# Patient Record
Sex: Female | Born: 1950 | Race: White | Hispanic: No | Marital: Married | Smoking: Current every day smoker
Health system: Southern US, Community
[De-identification: ages and names within clinical notes are randomized; demographics above are authoritative.]

## PROBLEM LIST (undated history)

## (undated) DIAGNOSIS — F17200 Nicotine dependence, unspecified, uncomplicated: Secondary | ICD-10-CM

## (undated) DIAGNOSIS — E785 Hyperlipidemia, unspecified: Secondary | ICD-10-CM

## (undated) DIAGNOSIS — I872 Venous insufficiency (chronic) (peripheral): Secondary | ICD-10-CM

## (undated) DIAGNOSIS — I639 Cerebral infarction, unspecified: Secondary | ICD-10-CM

## (undated) DIAGNOSIS — M8580 Other specified disorders of bone density and structure, unspecified site: Secondary | ICD-10-CM

## (undated) DIAGNOSIS — J449 Chronic obstructive pulmonary disease, unspecified: Secondary | ICD-10-CM

## (undated) DIAGNOSIS — I6522 Occlusion and stenosis of left carotid artery: Secondary | ICD-10-CM

## (undated) DIAGNOSIS — K219 Gastro-esophageal reflux disease without esophagitis: Secondary | ICD-10-CM

## (undated) DIAGNOSIS — I700 Atherosclerosis of aorta: Secondary | ICD-10-CM

## (undated) DIAGNOSIS — K279 Peptic ulcer, site unspecified, unspecified as acute or chronic, without hemorrhage or perforation: Secondary | ICD-10-CM

## (undated) DIAGNOSIS — K635 Polyp of colon: Secondary | ICD-10-CM

## (undated) DIAGNOSIS — R0600 Dyspnea, unspecified: Secondary | ICD-10-CM

## (undated) DIAGNOSIS — K5790 Diverticulosis of intestine, part unspecified, without perforation or abscess without bleeding: Secondary | ICD-10-CM

## (undated) DIAGNOSIS — M6208 Separation of muscle (nontraumatic), other site: Secondary | ICD-10-CM

## (undated) DIAGNOSIS — K2210 Ulcer of esophagus without bleeding: Secondary | ICD-10-CM

## (undated) DIAGNOSIS — K209 Esophagitis, unspecified without bleeding: Secondary | ICD-10-CM

## (undated) DIAGNOSIS — K2970 Gastritis, unspecified, without bleeding: Secondary | ICD-10-CM

## (undated) DIAGNOSIS — F419 Anxiety disorder, unspecified: Secondary | ICD-10-CM

## (undated) DIAGNOSIS — I209 Angina pectoris, unspecified: Secondary | ICD-10-CM

## (undated) DIAGNOSIS — K8020 Calculus of gallbladder without cholecystitis without obstruction: Secondary | ICD-10-CM

## (undated) DIAGNOSIS — I2510 Atherosclerotic heart disease of native coronary artery without angina pectoris: Secondary | ICD-10-CM

## (undated) DIAGNOSIS — F32A Depression, unspecified: Secondary | ICD-10-CM

## (undated) DIAGNOSIS — K529 Noninfective gastroenteritis and colitis, unspecified: Secondary | ICD-10-CM

## (undated) DIAGNOSIS — I10 Essential (primary) hypertension: Secondary | ICD-10-CM

## (undated) DIAGNOSIS — R519 Headache, unspecified: Secondary | ICD-10-CM

## (undated) DIAGNOSIS — K760 Fatty (change of) liver, not elsewhere classified: Secondary | ICD-10-CM

## (undated) DIAGNOSIS — M5126 Other intervertebral disc displacement, lumbar region: Secondary | ICD-10-CM

## (undated) DIAGNOSIS — M1990 Unspecified osteoarthritis, unspecified site: Secondary | ICD-10-CM

## (undated) DIAGNOSIS — Z87891 Personal history of nicotine dependence: Secondary | ICD-10-CM

## (undated) DIAGNOSIS — C4492 Squamous cell carcinoma of skin, unspecified: Secondary | ICD-10-CM

## (undated) DIAGNOSIS — E559 Vitamin D deficiency, unspecified: Secondary | ICD-10-CM

## (undated) DIAGNOSIS — I6529 Occlusion and stenosis of unspecified carotid artery: Secondary | ICD-10-CM

## (undated) DIAGNOSIS — K589 Irritable bowel syndrome without diarrhea: Secondary | ICD-10-CM

## (undated) DIAGNOSIS — F329 Major depressive disorder, single episode, unspecified: Secondary | ICD-10-CM

## (undated) DIAGNOSIS — C801 Malignant (primary) neoplasm, unspecified: Secondary | ICD-10-CM

## (undated) HISTORY — PX: BREAST BIOPSY: SHX20

## (undated) HISTORY — PX: DILATION AND CURETTAGE OF UTERUS: SHX78

## (undated) HISTORY — PX: VASCULAR SURGERY: SHX849

## (undated) HISTORY — PX: ABDOMINAL HYSTERECTOMY: SHX81

## (undated) HISTORY — PX: COLONOSCOPY W/ POLYPECTOMY: SHX1380

## (undated) HISTORY — PX: CHOLECYSTECTOMY: SHX55

## (undated) HISTORY — DX: Personal history of nicotine dependence: Z87.891

## (undated) HISTORY — DX: Malignant (primary) neoplasm, unspecified: C80.1

## (undated) HISTORY — PX: CAROTID ENDARTERECTOMY: SUR193

---

## 2006-08-16 ENCOUNTER — Emergency Department: Payer: Self-pay | Admitting: Emergency Medicine

## 2006-08-16 ENCOUNTER — Other Ambulatory Visit: Payer: Self-pay

## 2006-12-25 ENCOUNTER — Ambulatory Visit: Payer: Self-pay | Admitting: Family Medicine

## 2007-08-06 ENCOUNTER — Ambulatory Visit: Payer: Self-pay

## 2007-10-16 ENCOUNTER — Ambulatory Visit: Payer: Self-pay | Admitting: Family Medicine

## 2008-05-20 ENCOUNTER — Ambulatory Visit: Payer: Self-pay

## 2008-08-12 ENCOUNTER — Ambulatory Visit: Payer: Self-pay

## 2009-09-15 ENCOUNTER — Ambulatory Visit: Payer: Self-pay

## 2010-09-19 ENCOUNTER — Ambulatory Visit: Payer: Self-pay | Admitting: Family Medicine

## 2010-10-03 ENCOUNTER — Ambulatory Visit: Payer: Self-pay | Admitting: Family Medicine

## 2011-09-12 ENCOUNTER — Emergency Department: Payer: Self-pay | Admitting: Emergency Medicine

## 2011-09-12 LAB — COMPREHENSIVE METABOLIC PANEL
Albumin: 3.8 g/dL (ref 3.4–5.0)
Alkaline Phosphatase: 63 U/L (ref 50–136)
Anion Gap: 6 — ABNORMAL LOW (ref 7–16)
Bilirubin,Total: 0.5 mg/dL (ref 0.2–1.0)
Chloride: 110 mmol/L — ABNORMAL HIGH (ref 98–107)
Co2: 29 mmol/L (ref 21–32)
Creatinine: 0.83 mg/dL (ref 0.60–1.30)
EGFR (African American): >60
EGFR (Non-African Amer.): >60
Glucose: 136 mg/dL — ABNORMAL HIGH (ref 65–99)
Osmolality: 292 (ref 275–301)
Potassium: 4 mmol/L (ref 3.5–5.1)
SGPT (ALT): 18 U/L
Sodium: 145 mmol/L (ref 136–145)
Total Protein: 7.1 g/dL (ref 6.4–8.2)

## 2011-09-12 LAB — URINALYSIS, COMPLETE
Bilirubin,UR: NEGATIVE
Glucose,UR: NEGATIVE mg/dL (ref 0–75)
Leukocyte Esterase: NEGATIVE
Nitrite: NEGATIVE
Protein: NEGATIVE
RBC,UR: NONE SEEN /HPF (ref 0–5)
Specific Gravity: 1.012 (ref 1.003–1.030)
Squamous Epithelial: NONE SEEN
WBC UR: <1 /HPF (ref 0–5)

## 2011-09-12 LAB — CBC WITH DIFFERENTIAL/PLATELET
Basophil %: 0.7 %
Eosinophil #: 0.2 10*3/uL (ref 0.0–0.7)
HGB: 14.4 g/dL (ref 12.0–16.0)
Lymphocyte #: 2 10*3/uL (ref 1.0–3.6)
MCH: 34.3 pg — ABNORMAL HIGH (ref 26.0–34.0)
MCV: 97 fL (ref 80–100)
Monocyte #: 0.5 x10 3/mm (ref 0.2–0.9)
Monocyte %: 6.9 %
Neutrophil #: 4.6 10*3/uL (ref 1.4–6.5)
Neutrophil %: 62.6 %
Platelet: 181 10*3/uL (ref 150–440)
RBC: 4.19 10*6/uL (ref 3.80–5.20)
RDW: 14.9 % — ABNORMAL HIGH (ref 11.5–14.5)

## 2011-09-12 LAB — TSH: Thyroid Stimulating Horm: 1.44 u[IU]/mL

## 2011-11-23 ENCOUNTER — Ambulatory Visit: Payer: Self-pay | Admitting: Family Medicine

## 2012-01-23 LAB — COMPREHENSIVE METABOLIC PANEL
BUN: 16 mg/dL (ref 7–18)
Bilirubin,Total: 0.5 mg/dL (ref 0.2–1.0)
Calcium, Total: 9.4 mg/dL (ref 8.5–10.1)
Chloride: 107 mmol/L (ref 98–107)
Co2: 27 mmol/L (ref 21–32)
Creatinine: 0.88 mg/dL (ref 0.60–1.30)
EGFR (African American): >60
EGFR (Non-African Amer.): >60
Potassium: 4.5 mmol/L (ref 3.5–5.1)
SGOT(AST): 20 U/L (ref 15–37)
SGPT (ALT): 17 U/L (ref 12–78)

## 2012-01-23 LAB — URINALYSIS, COMPLETE
Bacteria: NONE SEEN
Bilirubin,UR: NEGATIVE
Glucose,UR: NEGATIVE mg/dL (ref 0–75)
Ketone: NEGATIVE
Nitrite: NEGATIVE
WBC UR: 38 /HPF (ref 0–5)

## 2012-01-23 LAB — CBC
MCHC: 36.1 g/dL — ABNORMAL HIGH (ref 32.0–36.0)
MCV: 96 fL (ref 80–100)
Platelet: 216 10*3/uL (ref 150–440)
RDW: 14.6 % — ABNORMAL HIGH (ref 11.5–14.5)

## 2012-01-23 LAB — DRUG SCREEN, URINE
Amphetamines, Ur Screen: NEGATIVE (ref ?–1000)
Barbiturates, Ur Screen: NEGATIVE (ref ?–200)
Benzodiazepine, Ur Scrn: NEGATIVE (ref ?–200)
MDMA (Ecstasy)Ur Screen: NEGATIVE (ref ?–500)
Methadone, Ur Screen: NEGATIVE (ref ?–300)
Tricyclic, Ur Screen: NEGATIVE (ref ?–1000)

## 2012-01-23 LAB — TROPONIN I: Troponin-I: <0.02 ng/mL

## 2012-01-23 LAB — ETHANOL: Ethanol: <3 mg/dL

## 2012-01-24 ENCOUNTER — Inpatient Hospital Stay: Payer: Self-pay | Admitting: Internal Medicine

## 2012-01-24 LAB — BASIC METABOLIC PANEL
Anion Gap: 7 (ref 7–16)
BUN: 14 mg/dL (ref 7–18)
Co2: 28 mmol/L (ref 21–32)
Creatinine: 0.77 mg/dL (ref 0.60–1.30)
EGFR (African American): >60

## 2012-01-24 LAB — LIPID PANEL
Cholesterol: 153 mg/dL (ref 0–200)
Ldl Cholesterol, Calc: 94 mg/dL (ref 0–100)
Triglycerides: 164 mg/dL (ref 0–200)
VLDL Cholesterol, Calc: 33 mg/dL (ref 5–40)

## 2012-01-24 LAB — CBC WITH DIFFERENTIAL/PLATELET
Basophil #: 0.1 10*3/uL (ref 0.0–0.1)
Eosinophil #: 0.2 10*3/uL (ref 0.0–0.7)
HCT: 38.1 % (ref 35.0–47.0)
Lymphocyte %: 25.2 %
MCH: 34 pg (ref 26.0–34.0)
MCHC: 35.6 g/dL (ref 32.0–36.0)
MCV: 96 fL (ref 80–100)
Monocyte %: 12 %
Neutrophil %: 59.9 %
Platelet: 189 10*3/uL (ref 150–440)
RDW: 14.6 % — ABNORMAL HIGH (ref 11.5–14.5)
WBC: 10.7 10*3/uL (ref 3.6–11.0)

## 2012-01-24 LAB — TSH: Thyroid Stimulating Horm: 1.82 u[IU]/mL

## 2012-01-24 LAB — TROPONIN I: Troponin-I: <0.02 ng/mL

## 2012-01-25 LAB — URINE CULTURE

## 2012-12-04 ENCOUNTER — Ambulatory Visit: Payer: Self-pay | Admitting: Family Medicine

## 2012-12-23 DIAGNOSIS — G8929 Other chronic pain: Secondary | ICD-10-CM | POA: Insufficient documentation

## 2012-12-23 DIAGNOSIS — M5126 Other intervertebral disc displacement, lumbar region: Secondary | ICD-10-CM | POA: Insufficient documentation

## 2012-12-31 ENCOUNTER — Ambulatory Visit: Payer: Self-pay | Admitting: Family Medicine

## 2013-06-30 DIAGNOSIS — J309 Allergic rhinitis, unspecified: Secondary | ICD-10-CM | POA: Insufficient documentation

## 2013-08-21 DIAGNOSIS — M533 Sacrococcygeal disorders, not elsewhere classified: Secondary | ICD-10-CM | POA: Insufficient documentation

## 2013-09-12 ENCOUNTER — Ambulatory Visit: Payer: Self-pay | Admitting: Family Medicine

## 2013-11-18 DIAGNOSIS — K649 Unspecified hemorrhoids: Secondary | ICD-10-CM | POA: Insufficient documentation

## 2013-11-25 ENCOUNTER — Ambulatory Visit: Payer: Self-pay | Admitting: Family Medicine

## 2013-12-03 DIAGNOSIS — F17200 Nicotine dependence, unspecified, uncomplicated: Secondary | ICD-10-CM | POA: Insufficient documentation

## 2013-12-03 DIAGNOSIS — I639 Cerebral infarction, unspecified: Secondary | ICD-10-CM | POA: Insufficient documentation

## 2013-12-16 DIAGNOSIS — K760 Fatty (change of) liver, not elsewhere classified: Secondary | ICD-10-CM | POA: Insufficient documentation

## 2013-12-16 DIAGNOSIS — K8020 Calculus of gallbladder without cholecystitis without obstruction: Secondary | ICD-10-CM | POA: Insufficient documentation

## 2013-12-22 ENCOUNTER — Ambulatory Visit: Payer: Self-pay | Admitting: Surgery

## 2013-12-22 LAB — CBC WITH DIFFERENTIAL/PLATELET
BASOS ABS: 0.1 10*3/uL (ref 0.0–0.1)
Basophil %: 1 %
EOS ABS: 0.2 10*3/uL (ref 0.0–0.7)
Eosinophil %: 2.3 %
HCT: 42.9 % (ref 35.0–47.0)
HGB: 14.5 g/dL (ref 12.0–16.0)
Lymphocyte #: 2.7 10*3/uL (ref 1.0–3.6)
Lymphocyte %: 28.8 %
MCH: 32.8 pg (ref 26.0–34.0)
MCHC: 33.8 g/dL (ref 32.0–36.0)
MCV: 97 fL (ref 80–100)
MONOS PCT: 7.4 %
Monocyte #: 0.7 x10 3/mm (ref 0.2–0.9)
NEUTROS ABS: 5.7 10*3/uL (ref 1.4–6.5)
Neutrophil %: 60.5 %
PLATELETS: 221 10*3/uL (ref 150–440)
RBC: 4.43 10*6/uL (ref 3.80–5.20)
RDW: 14.5 % (ref 11.5–14.5)
WBC: 9.4 10*3/uL (ref 3.6–11.0)

## 2013-12-22 LAB — BASIC METABOLIC PANEL
Anion Gap: 7 (ref 7–16)
BUN: 13 mg/dL (ref 7–18)
CALCIUM: 9.7 mg/dL (ref 8.5–10.1)
CO2: 28 mmol/L (ref 21–32)
CREATININE: 0.82 mg/dL (ref 0.60–1.30)
Chloride: 111 mmol/L — ABNORMAL HIGH (ref 98–107)
EGFR (African American): >60
EGFR (Non-African Amer.): >60
Glucose: 93 mg/dL (ref 65–99)
OSMOLALITY: 290 (ref 275–301)
Potassium: 4.6 mmol/L (ref 3.5–5.1)
Sodium: 146 mmol/L — ABNORMAL HIGH (ref 136–145)

## 2013-12-30 ENCOUNTER — Ambulatory Visit: Payer: Self-pay | Admitting: Surgery

## 2014-01-01 LAB — PATHOLOGY REPORT

## 2014-01-15 DIAGNOSIS — K219 Gastro-esophageal reflux disease without esophagitis: Secondary | ICD-10-CM | POA: Insufficient documentation

## 2014-01-29 ENCOUNTER — Emergency Department: Payer: Self-pay | Admitting: Emergency Medicine

## 2014-01-29 LAB — TROPONIN I: Troponin-I: <0.02 ng/mL

## 2014-01-29 LAB — COMPREHENSIVE METABOLIC PANEL
ALBUMIN: 4.2 g/dL (ref 3.4–5.0)
Alkaline Phosphatase: 76 U/L
Anion Gap: 7 (ref 7–16)
BILIRUBIN TOTAL: 0.7 mg/dL (ref 0.2–1.0)
BUN: 10 mg/dL (ref 7–18)
CALCIUM: 9.3 mg/dL (ref 8.5–10.1)
CREATININE: 1 mg/dL (ref 0.60–1.30)
Chloride: 110 mmol/L — ABNORMAL HIGH (ref 98–107)
Co2: 25 mmol/L (ref 21–32)
EGFR (Non-African Amer.): 59 — ABNORMAL LOW
Glucose: 137 mg/dL — ABNORMAL HIGH (ref 65–99)
Osmolality: 284 (ref 275–301)
POTASSIUM: 4 mmol/L (ref 3.5–5.1)
SGOT(AST): 24 U/L (ref 15–37)
SGPT (ALT): 24 U/L
SODIUM: 142 mmol/L (ref 136–145)
TOTAL PROTEIN: 7.8 g/dL (ref 6.4–8.2)

## 2014-01-29 LAB — CBC
HCT: 47.6 % — ABNORMAL HIGH (ref 35.0–47.0)
HGB: 15.7 g/dL (ref 12.0–16.0)
MCH: 31.8 pg (ref 26.0–34.0)
MCHC: 32.9 g/dL (ref 32.0–36.0)
MCV: 97 fL (ref 80–100)
Platelet: 217 10*3/uL (ref 150–440)
RBC: 4.92 10*6/uL (ref 3.80–5.20)
RDW: 14.7 % — AB (ref 11.5–14.5)
WBC: 13.9 10*3/uL — AB (ref 3.6–11.0)

## 2014-01-29 LAB — URINALYSIS, COMPLETE
Bilirubin,UR: NEGATIVE
Blood: NEGATIVE
Glucose,UR: NEGATIVE mg/dL (ref 0–75)
KETONE: NEGATIVE
NITRITE: NEGATIVE
PH: 5 (ref 4.5–8.0)
Protein: 100
RBC,UR: 9 /HPF (ref 0–5)
SPECIFIC GRAVITY: 1.024 (ref 1.003–1.030)

## 2014-01-29 LAB — LIPASE, BLOOD: LIPASE: 144 U/L (ref 73–393)

## 2014-01-30 ENCOUNTER — Ambulatory Visit: Payer: Self-pay | Admitting: Gastroenterology

## 2014-02-04 LAB — PATHOLOGY REPORT

## 2014-03-05 DIAGNOSIS — Z8673 Personal history of transient ischemic attack (TIA), and cerebral infarction without residual deficits: Secondary | ICD-10-CM | POA: Insufficient documentation

## 2014-03-05 DIAGNOSIS — L299 Pruritus, unspecified: Secondary | ICD-10-CM | POA: Insufficient documentation

## 2014-05-04 DIAGNOSIS — F32A Depression, unspecified: Secondary | ICD-10-CM | POA: Insufficient documentation

## 2014-06-03 DIAGNOSIS — J069 Acute upper respiratory infection, unspecified: Secondary | ICD-10-CM | POA: Insufficient documentation

## 2014-08-18 NOTE — Discharge Summary (Signed)
PATIENT NAME:  Molly Matthews, Molly Matthews MR#:  [id number] DATE OF BIRTH:  1950-08-28  DATE OF ADMISSION:  01/24/2012 DATE OF DISCHARGE:  01/25/2012  DISCHARGE DIAGNOSIS: Possible transient ischemic attack, started on aspirin and statin. Magnetic resonance imaging of the brain negative. 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 No further intervention at this time. Normal echocardiogram.   SECONDARY DIAGNOSES:  1. Peripheral vascular disease.  2. Hyperlipidemia.  3. Depression. 4. Gastroesophageal reflux disease.   CONSULTATION: Vascular surgery, Dr. Delana Meyer.   LABORATORY, DIAGNOSTIC AND RADIOLOGICAL DATA: Right foot x-ray on 01/23/2012 showed no acute bony abnormality of the right foot. Right ankle x-ray showed no acute bony abnormality. Large plantar calcaneal spur. CT scan of the head without contrast 01/23/2012 showed no acute intracranial process. MRI of the brain without contrast on 01/24/2012 showed no evidence of focal or acute abnormalities. Bilateral carotid Doppler's on 01/24/2012 showed occlusion of the left internal carotid artery. Proximal to this showing moderate amount of soft and calcified plaque. On the right evidence of previous endarterectomy and no evidence of hemodynamically significant stenosis.   2-D echocardiogram on 01/24/2112 showed normal LV size. No thrombus. Normal LV systolic function. Ejection fraction more than 55%.   Urinalysis on admission showed 38 WBCs, 3+ leukocyte esterase, otherwise negative. Urine culture was contaminated.   HISTORY AND SHORT HOSPITAL COURSE: Patient is a 64 year old female with above-mentioned medical problems was admitted for suspected transient ischemic attack. There was also concern for possible urinary tract infection and was started on antibiotic. Please see Dr. Marshia Ly dictated history and physical for further details. Her symptoms had already resolved. Underwent full  neurological evaluation which were all essentially within normal limits except carotid Doppler which showed left ICA occlusion for which vascular surgery consultation was obtained with Dr. Delana Meyer who recommended outpatient follow up with him and continuing antiplatelet agent along with cholesterol medication. Patient had negative three sets of cardiac enzymes and normal TSH. She also had negative alcohol level and negative urine toxicology. Her urine culture remained contaminated. She was not symptomatic and had no bacteria in the urinalysis. Was discharged home on 01/25/2012 in stable condition.   PHYSICAL EXAMINATION: VITAL SIGNS: On the date of discharge her vital signs were as follows: Temperature 98.6, heart rate 79 per minute, respirations 17 per minute, blood pressure 134/70 mmHg. She was saturating 95% on room air. Pertinent Physical Examination on the date of discharge: CARDIOVASCULAR: S1, S2 normal. No murmur, rales, or gallop. LUNGS: Clear to auscultation bilaterally. No wheezing, rales, rhonchi, crepitation. ABDOMEN: Soft, benign. NEUROLOGIC: Nonfocal examination. All other physical examination remained at the baseline.   DISCHARGE MEDICATIONS:  1. Aspirin 81 mg p.o. daily.  2. Citalopram 40 mg p.o. daily.  3. Pravachol 20 mg p.o. at bedtime.   DISCHARGE DIET: Low sodium, low fat, low cholesterol.   DISCHARGE ACTIVITY: As tolerated.   DISCHARGE INSTRUCTIONS AND FOLLOW UP: Patient was instructed to follow up with her primary care physician at [facility] in 1 to 2 weeks. She will need follow up with Dr. Delana Meyer from vascular surgery in 2 to 4 weeks.   TOTAL TIME DISCHARGING THIS PATIENT: 55 minutes.   ____________________________ Lucina Mellow. Manuella Ghazi, MD vss:cms D: 01/25/2012 99:24:26 ET T: 01/26/2012 12:03:50 ET  JOB#: [id number] cc: Harlan Vinal S. Manuella Ghazi, MD, <Dictator> [facility] Katha Cabal, MD Lucina Mellow [facility] MD ELECTRONICALLY SIGNED 01/27/2012 19:58

## 2014-08-18 NOTE — Consult Note (Signed)
Brief Consult Note: Diagnosis: TIA; left carotid occlusion.   Patient was seen by consultant.   Comments: No surgical intervention at this point given the likely occlusion of the left ICA.  I recommend that a CTA be obtained to verify that the left side is occluded and would continue antiplatelet therapy.  Electronic Signatures: Hortencia Pilar (MD)  (Signed 25-Sep-13 22:13)  Authored: Brief Consult Note   Last Updated: 25-Sep-13 22:13 by Hortencia Pilar (MD)

## 2014-08-18 NOTE — H&P (Signed)
PATIENT NAME:  Molly Matthews, Molly Matthews MR#:  [id number] DATE OF BIRTH:  05-27-1950  DATE OF ADMISSION:  01/23/2012  PRIMARY CARE PHYSICIAN: [facility].   CHIEF COMPLAINT: Had an episode in triage with right arm incoordination.   HISTORY OF PRESENT ILLNESS: This is a 64 year old female initially came in with a twisted ankle and pain in the foot and then had a fall. While she was sitting in the lobby she was playing a game on her cell phone and then could not coordinate with her right hand and her fingers on the touch screen. Her head was feeling weird. She was called in by the triage nurse and she was babbling with her responses to questions and then took a while for her to answer and then both arms felt weak at that time. Of note, the patient has had this left eye blurred vision going on for about six months and was referred over her to an ophthalmologist to diagnose the dry eye. Change in lenses and eye drops did not make a difference. Hospitalist services were contacted for evaluation for possible transient ischemic attack.  PAST MEDICAL HISTORY:  1. Peripheral vascular disease. 2. Hyperlipidemia. 3. Depression. 4. Gastroesophageal reflux disease.   PAST SURGICAL HISTORY:  1. Right carotid endarterectomy.  2. Hysterectomy.  3. Dilatation and curettage. 4. Breast biopsy.   ALLERGIES: No known drug allergies.   MEDICATIONS:  1. Celexa 40 mg daily.  2. BC powder. 3. Occasional Zantac.   SOCIAL HISTORY: Smokes 1 pack per day for 40+ years. No alcohol. No drug use. Does pressure washing.   FAMILY HISTORY: Mother died at 65 of a cerebrovascular accident, had atrial fibrillation and hypertension. Father died at 52 of a myocardial infarction. Brother died of lung cancer. Brother and sister with diabetes.  REVIEW OF SYSTEMS: CONSTITUTIONAL: Positive for sweats all the time. No fever. No chills. Positive for weight gain. Positive for fatigue all the time. EYES: Left eye blurring despite  glasses. EARS, NOSE, MOUTH, AND THROAT: No hearing loss. No sore throat. No difficulty swallowing. CARDIOVASCULAR: No chest pain. Positive for palpitations. RESPIRATORY: Positive for shortness of breath with exertion. Positive for coughing up phlegm. No hemoptysis. GASTROINTESTINAL: Positive for the gastroesophageal reflux pain. No nausea. No vomiting. Occasional constipation. No bright red blood per rectum. No melena. GENITOURINARY: No burning on urination. No hematuria. No trouble urinating. Urinates less frequently. Does not drink a lot of water. MUSCULOSKELETAL: Positive for right foot pain. Positive for joint pains all over neck, shoulders, back, legs and knees. PSYCHIATRIC: Positive for anxiety. ENDOCRINE: No thyroid problems. HEMATOLOGIC/LYMPHATIC: No anemia. No easy bruising or bleeding.   PHYSICAL EXAMINATION:  VITAL SIGNS: Temperature 98.1, pulse 77, respirations 20, blood pressure 139/61, and pulse oximetry 98% on room air.   GENERAL: No respiratory distress.   EYES: Conjunctivae and lids normal. Pupils equal, round, and reactive to light. Extraocular muscles intact. No nystagmus.   EARS, NOSE, MOUTH, AND THROAT: Tympanic membranes: No erythema. Nasal mucosa: No erythema. Throat: No erythema. No exudate seen. Lips and gums: No lesions.   NECK: No JVD. No bruits. No lymphadenopathy. No thyromegaly. No thyroid nodules palpated.   LUNGS: Lungs are clear to auscultation. No use of accessory muscles to breathe. No rhonchi, rales, or wheeze heard.   CARDIOVASCULAR: S1, S2 normal. No gallops, rubs, or murmurs heard. Carotid upstroke 2+ bilaterally. No bruits.   EXTREMITIES: Dorsalis pedis pulses 2+ bilaterally. No edema of the lower extremity.   ABDOMEN: Soft. Slight tenderness in  the epigastric area. No organomegaly/splenomegaly. Normoactive bowel sounds. No masses felt.   LYMPHATIC: No lymph nodes in the neck.   MUSCULOSKELETAL: No clubbing, edema, or cyanosis.   SKIN: No rashes or  ulcers seen.   NEUROLOGIC: Cranial nerves II through XII grossly intact. Deep tendon reflexes 2+ bilateral lower extremity. Power 5/5 upper and lower extremities. Right hand coordination is intact.   PSYCHIATRIC: The patient is oriented to person, place, and time.   LABORATORY, DIAGNOSTIC, AND RADIOLOGICAL DATA: Urinalysis: 3+ leukocyte esterase. Urine toxicology negative. White blood cell count 16.4, hemoglobin and hematocrit 14.8 and 41.0, platelet count 216, glucose 115, BUN 16, creatinine 0.88, sodium 142, potassium 4.5, chloride 107, CO2 27, calcium 9.4. Liver function tests normal range. Troponin negative. Right ankle plantar calcaneal spur right foot. No fracture. CT scan of the head: No acute intracranial process. EKG: Normal sinus rhythm, 80 beats per minute, left atrial enlargement, flipped T waves laterally.   ASSESSMENT AND PLAN:  1. Suspected transient ischemic attack. We will give aspirin 325 mg x1 and 81 mg daily. Get an MRI of the brain. Echocardiogram, carotid ultrasound. Check a lipid profile and start Zocor. We will admit as an observation. Symptoms had resolved. The patient was advised to stop BC powder.  2. Urinary tract infection with leukocytosis. We will give Rocephin 1 gram IV x1 stat and today. Send off a urine culture. This could also cause some strange neurological symptoms.  3. Gastroesophageal reflux disease. Stop BC powder. Start omeprazole.  4. Depression. Continue Celexa.  5. Peripheral vascular disease. We will check a carotid ultrasound.  6. Tobacco abuse. Smoking cessation counseling done three minutes by me. The best thing she could do for her health is quit the smoking. The best thing that she can do for her peripheral vascular disease is stop the smoking. Nicotine patch applied.  7. Fatigue and weight gain. We will check a TSH.  TIME SPENT ON ADMISSION: 55 minutes.   ____________________________ Tana Conch. Leslye Peer, MD rjw:ap D: 01/23/2012 22:44:45  ET T: 01/24/2012 07:57:29 ET JOB#: [id number]  cc: Tana Conch. Leslye Peer, MD, <Dictator> [place] SIGNED 01/30/2012 21:22

## 2014-08-22 NOTE — Op Note (Signed)
PATIENT NAME:  MARIAMA, SAINTVIL MR#:  [id number] DATE OF BIRTH:  1950-08-04  DATE OF PROCEDURE:  12/30/2013  PREOPERATIVE DIAGNOSIS: Chronic cholecystitis, cholelithiasis.   POSTOPERATIVE DIAGNOSES: Chronic cholecystitis, cholelithiasis.   PROCEDURE: Laparoscopic cholecystectomy, cholangiogram.   SURGEON: Dr. Rochel Brome.   ANESTHESIA: General.   INDICATIONS: This 64 year old female has history of right upper quadrant pains with associated nausea. Ultrasound demonstrated a fatty liver and gallstones, normal size common bile duct.  DESCRIPTION OF PROCEDURE: The patient was placed on the operating table in the supine position under general endotracheal anesthesia. The abdomen was prepared with ChloraPrep and draped in a sterile manner. A short incision was made in the inferior aspect of the umbilicus and carried down to the deep fascia which was grasped with laryngeal hook and elevated. A Veress needle was inserted, aspirated, and irrigated with a saline solution. Next, the peritoneal cavity was inflated with carbon dioxide. The Veress needle was removed. The 10-mm cannula was inserted. The 10-mm 0-degree laparoscope was inserted to view the peritoneal cavity. There was a fatty appearance of the liver. Another incision was made in the epigastrium, slightly to the right of the midline to introduce an 11-mm cannula. Two incisions were made in the lateral aspect of the right upper quadrant to introduce two 5-mm cannulas. With the patient in the reverse Trendelenburg position and turned several degrees to the left. The gallbladder was retracted towards the right shoulder. The dissection was carried out to isolate the cystic duct and the cystic artery from surrounding tissues. The gallbladder neck was mobilized with incision of the visceral peritoneum. A critical view of safety was demonstrated. An Endo Clip was placed across the cystic duct adjacent to the gallbladder neck. An incision was made in the  cystic duct to introduce a Reddick catheter. Half-strength Conray 60 dye was injected as the cholangiogram was done with fluoroscopy demonstrating the biliary tree and flow of dye into the duodenum. The cholangiogram appeared normal. The Reddick catheter was removed. The cystic duct was doubly ligated with Endo Clips and divided. The cystic artery was controlled with Endo Clips and divided. The gallbladder was dissected free from the liver with hook and cautery. The gallbladder was delivered up through the infraumbilical incision, opened, and suctioned. The gallbladder did contain a fragment of brown stone. The gallbladder and stone were submitted in formalin for routine pathology. The right upper quadrant was further inspected. The cannulas were removed. Carbon dioxide allowed to escape from the peritoneal cavity. Skin incisions were closed with interrupted 5-0 chromic subcuticular suture, benzoin, and Steri-Strips. Dressings were applied with paper tape. The patient tolerated surgery satisfactorily and was prepared for transfer to the recovery room.    ____________________________ Lenna Sciara. Rochel Brome, MD jws:lt D: 01/12/2014 13:57:44 ET T: 01/12/2014 18:48:08 ET JOB#: [id number]  cc: Loreli Dollar, MD, <Dictator> Loreli Dollar MD ELECTRONICALLY SIGNED 01/13/2014 19:43

## 2014-08-22 NOTE — Op Note (Signed)
PATIENT NAME:  SERAIAH, NOWACK MR#:  [id number] DATE OF BIRTH:  [birth year]  DATE OF PROCEDURE:  08/30/1999  PREOPERATIVE DIAGNOSES:  Chronic cholecystitis, cholelithiasis.   POSTOPERATIVE DIAGNOSES:  Chronic cholecystitis, cholelithiasis.  PROCEDURES: Laparoscopic cholecystectomy, cholangiogram.   SURGEON: Rochel Brome, MD.   ANESTHESIA: General.   INDICATIONS: This 64 year old female has a history of epigastric pains and ultrasound findings of gallstones. Surgery was recommended for definitive treatment.   DESCRIPTION OF PROCEDURE: The patient was placed on the operating table in the supine position under general endotracheal anesthesia. The abdomen was prepared with ChloraPrep and draped in a sterile manner.   A short incision was made in the inferior aspect of the umbilicus and carried down to the deep fascia which was grasped with laryngeal hook and elevated. A Veress needle was inserted, aspirated, and irrigated with a saline solution. Next, the peritoneal cavity was inflated with carbon dioxide. The Veress needle was removed. The 10-mm cannula was inserted. The 10-mm 0-degree laparoscope was inserted to view the peritoneal cavity. There was some evidence of fatty infiltration of the liver. The stomach appeared to be mildly distended and I did have the anesthetist drain the stomach with an orogastric tube, after which it was decompressed. Another incision was made in the epigastrium, slightly to the right of the midline, to introduce an 11-mm cannula. Two incisions were made in the lateral aspect of the right upper quadrant to introduce two 5-mm cannulae.   The gallbladder appeared to have some thickening of the wall. It was retracted towards the right shoulder. Multiple adhesions were taken down with blunt and sharp dissection. The infundibulum was retracted inferiorly and laterally. The porta hepatis was noted. Some fatty tissues were dissected away from the gallbladder neck. The  gallbladder neck was mobilized with incision of the visceral peritoneum. The cystic duct was dissected free from surrounding structures. The cystic artery was dissected free from surrounding structures. A critical value view of safety was demonstrated. The cystic artery was controlled with double endoclips and divided. This allowed better traction on the cystic duct. An endoclip was placed across the cystic duct adjacent to the gallbladder neck. An incision was made in the cystic duct and there were several small stones, which came out. This was irrigated with the Reddick catheter and a few additional small stones came out and these were aspirated with the suction.   Next, the Reddick catheter was inserted and the balloon was inflated. The cholangiogram was done with injection of half-strength Conray-50 dye. The cholangiogram appeared normal with demonstration the biliary tree and prompt flow of dye into the duodenum and no retained stones seen. The Reddick catheter was removed. The cystic duct was doubly ligated with endoclips and divided. The gallbladder was dissected free from the liver with hook and cautery. Bleeding was minimal. The gallbladder was completely separated. Hemostasis was intact. The gallbladder was delivered up through the infraumbilical incision, opened and suctioned, removed and submitted in formalin for routine pathology.   The right upper quadrant was further inspected. Hemostasis was intact. The cannulae were removed allowing carbon dioxide to escape from the peritoneal cavity. The fascial defect at the umbilicus was closed with a 0-Vicryl simple suture. The skin incisions were closed with interrupted 5-0 chromic subcuticular suture, Benzoin, and Steri-Strips. Dressings were applied with paper tape. The patient tolerated surgery satisfactorily and was then prepared for transfer to the recovery room.    ____________________________ Lenna Sciara. Rochel Brome, MD jws:lt D: 12/30/2013 11:10:28  ET T: 12/30/2013  11:35:40 ET JOB#: [id number]  cc: Loreli Dollar, MD, <Dictator> Loreli Dollar MD ELECTRONICALLY SIGNED 01/01/2014 16:54

## 2014-09-22 DIAGNOSIS — R1013 Epigastric pain: Secondary | ICD-10-CM | POA: Insufficient documentation

## 2014-10-01 ENCOUNTER — Other Ambulatory Visit: Payer: Self-pay | Admitting: Internal Medicine

## 2014-10-01 DIAGNOSIS — Z1231 Encounter for screening mammogram for malignant neoplasm of breast: Secondary | ICD-10-CM

## 2014-10-09 ENCOUNTER — Ambulatory Visit
Admission: RE | Admit: 2014-10-09 | Discharge: 2014-10-09 | Disposition: A | Discharge status: Home / Self Care | Payer: Medicare HMO | Source: Ambulatory Visit | Attending: Internal Medicine | Admitting: Internal Medicine

## 2014-10-09 DIAGNOSIS — Z1231 Encounter for screening mammogram for malignant neoplasm of breast: Secondary | ICD-10-CM

## 2014-10-12 ENCOUNTER — Other Ambulatory Visit: Payer: Self-pay | Admitting: Internal Medicine

## 2014-10-12 ENCOUNTER — Ambulatory Visit
Admission: RE | Admit: 2014-10-12 | Discharge: 2014-10-12 | Disposition: A | Discharge status: Home / Self Care | Payer: Medicare HMO | Source: Ambulatory Visit | Attending: Internal Medicine | Admitting: Internal Medicine

## 2014-10-12 DIAGNOSIS — Z1231 Encounter for screening mammogram for malignant neoplasm of breast: Secondary | ICD-10-CM

## 2014-11-09 DIAGNOSIS — B3731 Acute candidiasis of vulva and vagina: Secondary | ICD-10-CM | POA: Insufficient documentation

## 2014-11-11 DIAGNOSIS — M6208 Separation of muscle (nontraumatic), other site: Secondary | ICD-10-CM | POA: Insufficient documentation

## 2014-11-11 DIAGNOSIS — M5126 Other intervertebral disc displacement, lumbar region: Secondary | ICD-10-CM | POA: Insufficient documentation

## 2015-02-22 ENCOUNTER — Other Ambulatory Visit: Payer: Self-pay | Admitting: Physician Assistant

## 2015-02-22 DIAGNOSIS — K760 Fatty (change of) liver, not elsewhere classified: Secondary | ICD-10-CM

## 2015-02-22 DIAGNOSIS — R1310 Dysphagia, unspecified: Secondary | ICD-10-CM

## 2015-02-22 DIAGNOSIS — R1010 Upper abdominal pain, unspecified: Secondary | ICD-10-CM

## 2015-02-26 ENCOUNTER — Ambulatory Visit
Admission: RE | Admit: 2015-02-26 | Discharge: 2015-02-26 | Disposition: A | Discharge status: Home / Self Care | Payer: Medicare HMO | Source: Ambulatory Visit | Attending: Physician Assistant | Admitting: Physician Assistant

## 2015-02-26 DIAGNOSIS — R1310 Dysphagia, unspecified: Secondary | ICD-10-CM

## 2015-02-26 DIAGNOSIS — K760 Fatty (change of) liver, not elsewhere classified: Secondary | ICD-10-CM | POA: Insufficient documentation

## 2015-02-26 DIAGNOSIS — R1010 Upper abdominal pain, unspecified: Secondary | ICD-10-CM | POA: Diagnosis present

## 2015-02-26 DIAGNOSIS — Z9049 Acquired absence of other specified parts of digestive tract: Secondary | ICD-10-CM | POA: Insufficient documentation

## 2015-03-23 ENCOUNTER — Other Ambulatory Visit: Payer: Self-pay | Admitting: Gastroenterology

## 2015-03-23 DIAGNOSIS — R1310 Dysphagia, unspecified: Secondary | ICD-10-CM

## 2015-03-29 ENCOUNTER — Ambulatory Visit: Payer: Medicare HMO

## 2015-03-31 ENCOUNTER — Other Ambulatory Visit: Payer: Self-pay | Admitting: Gastroenterology

## 2015-03-31 DIAGNOSIS — R1310 Dysphagia, unspecified: Secondary | ICD-10-CM

## 2015-04-08 ENCOUNTER — Ambulatory Visit
Admission: RE | Admit: 2015-04-08 | Discharge: 2015-04-08 | Disposition: A | Discharge status: Home / Self Care | Payer: Medicare HMO | Source: Ambulatory Visit | Attending: Gastroenterology | Admitting: Gastroenterology

## 2015-04-08 DIAGNOSIS — R1310 Dysphagia, unspecified: Secondary | ICD-10-CM | POA: Diagnosis not present

## 2015-04-08 NOTE — Therapy (Signed)
[place], [place], [address] Phone: [phone]   Fax:     Modified Barium Swallow  Patient Details  Name: Molly Matthews MRN: [id number] Date of Birth: [birth year] No Data Recorded  Encounter Date: Apr 22, 2015   Subjective: Patient behavior: (alertness, ability to follow instructions, etc.): Chief complaint   Objective:  Radiological Procedure: A videoflouroscopic evaluation of oral-preparatory, reflex initiation, and pharyngeal phases of the swallow was performed; as well as a screening of the upper esophageal phase.  I. POSTURE: II. VIEW: III. COMPENSATORY STRATEGIES: IV. BOLUSES ADMINISTERED:  Thin Liquid:  Nectar-thick Liquid:  Honey-thick Liquid:  Puree:  Mechanical Soft: V. RESULTS OF EVALUATION: A. ORAL PREPARATORY PHASE: (The lips, tongue, and velum are observed for strength and coordination)       **Overall Severity Rating:  B. SWALLOW INITIATION/REFLEX: (The reflex is normal if "triggered" by the time the bolus reached the base of the tongue)  **Overall Severity Rating:  C. PHARYNGEAL PHASE: (Pharyngeal function is normal if the bolus shows rapid, smooth, and continuous transit through the pharynx and there is no pharyngeal residue after the swallow)  **Overall Severity Rating:  D. LARYNGEAL PENETRATION: (Material entering into the laryngeal inlet/vestibule but not aspirated)  E. ASPIRATION: F. ESOPHAGEAL PHASE: (Screening of the upper esophagus)  ASSESSMENT:  Pt presented w/ an adequate oropharyngeal phase swallow function w/ no dysphagia apparent during the oral phase or the pharyngeal phase. Timely pharyngeal swallow initiation w/ no aspiration or laryngeal penetration occurred. No oral phase deficits noted; no remaining pharyngeal residue post swallow of any food/liquid consistency. All bolus material appeared to move fully into the Esophagus clearing the pharynx. 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 Pt viewed the video of the study and was given thorough education on swallowing and the impact of the Esophagus on the oropharyngeal phases; education given on Reflux and diet recommendations. Also rec. Continued f/u w/ GI for Reflux management. Pt agreed.   PLAN/RECOMMENDATIONS:  A. Diet:  B. Swallowing Precautions:  C. Recommended consultation to  D. Therapy recommendations  E. Results and recommendations were      End of Session - 04-22-2015 1537    Visit Number 1   Number of Visits 1   Date for SLP Re-Evaluation 2015/04/22   SLP Start Time 1300   SLP Stop Time  1400   SLP Time Calculation (min) 60 min   Activity Tolerance Patient tolerated treatment well      No past medical history on file.  Past Surgical History  Procedure Laterality Date  . Breast biopsy Left     negative 2008    There were no vitals filed for this visit.  Visit Diagnosis: Dysphagia - Plan: DG OP Swallowing Func-Medicare/Speech Path, DG OP Swallowing Func-Medicare/Speech Path                               G-Codes - 2015-04-22 1537    Functional Assessment Tool Used clinical judgement   Functional Limitations Swallowing   Swallow Current Status [id number] At least 1 percent but less than 20 percent impaired, limited or restricted   Swallow Goal Status [id number]) At least 1 percent but less than 20 percent impaired, limited or restricted   Swallow Discharge Status [phone]) At least 1 percent but less than 20  percent impaired, limited or restricted          Problem List There are no active problems to display for this patient.   Edrei Norgaard 04/08/2015, 3:38 PM Orinda Kenner, MS, [place], [place], [address] Phone:  [phone]   Fax:     Name: JAMA ANKRUM MRN: [id number] Date of Birth: January 20, 1951

## 2015-04-24 ENCOUNTER — Ambulatory Visit
Admission: EM | Admit: 2015-04-24 | Discharge: 2015-04-24 | Disposition: A | Discharge status: Home / Self Care | Payer: Medicare HMO | Attending: Family Medicine | Admitting: Family Medicine

## 2015-04-24 ENCOUNTER — Encounter: Payer: Self-pay | Admitting: Emergency Medicine

## 2015-04-24 DIAGNOSIS — J441 Chronic obstructive pulmonary disease with (acute) exacerbation: Secondary | ICD-10-CM

## 2015-04-24 HISTORY — DX: Depression, unspecified: F32.A

## 2015-04-24 HISTORY — DX: Major depressive disorder, single episode, unspecified: F32.9

## 2015-04-24 HISTORY — DX: Hyperlipidemia, unspecified: E78.5

## 2015-04-24 MED ORDER — PREDNISONE 20 MG PO TABS: ORAL_TABLET | ORAL | Status: DC

## 2015-04-24 MED ORDER — DOXYCYCLINE HYCLATE 100 MG PO TABS: 100.0000 mg | ORAL_TABLET | Freq: Two times a day (BID) | ORAL | Status: DC

## 2015-04-24 NOTE — ED Notes (Signed)
Cough, congestion, runny nose, head pressure for 1 month

## 2015-04-24 NOTE — ED Provider Notes (Signed)
CSN: [id number]     Arrival date & time 04/24/15  [id number] History   First MD Initiated Contact with Patient 04/24/15 [phone]     Chief Complaint  Patient presents with  . Cough   (Consider location/radiation/quality/duration/timing/severity/associated sxs/prior Treatment) Patient is a 64 y.o. female presenting with cough.  Cough Cough characteristics:  Productive Sputum characteristics:  Green and yellow Severity:  Moderate Onset quality:  Sudden Duration:  4 weeks Timing:  Constant Progression:  Worsening Chronicity:  Chronic Smoker: yes (h/o COPD; chronic bronchitis; currently smokes 1ppd; 40 + pack year history)   Ineffective treatments:  Rest and cough suppressants Associated symptoms: chills and wheezing (mild)     Past Medical History  Diagnosis Date  . Depression   . Hyperlipidemia    Past Surgical History  Procedure Laterality Date  . Breast biopsy Left     negative 2008   Family History  Problem Relation Age of Onset  . Breast cancer Mother 31  . Breast cancer Sister 82   Social History  Substance Use Topics  . Smoking status: Current Every Day Smoker  . Smokeless tobacco: Never Used  . Alcohol Use: No   OB History    No data available     Review of Systems  Constitutional: Positive for chills.  Respiratory: Positive for cough and wheezing (mild).     Allergies  Sulfur and Zithromax  Home Medications   Prior to Admission medications   Medication Sig Start Date End Date Taking? Authorizing Provider  citalopram (CELEXA) 20 MG tablet Take 20 mg by mouth daily.   Yes Historical Provider, MD  simvastatin (ZOCOR) 20 MG tablet Take 20 mg by mouth daily.   Yes Historical Provider, MD  doxycycline (VIBRA-TABS) 100 MG tablet Take 1 tablet (100 mg total) by mouth 2 (two) times daily. 04/24/15   Norval Gable, MD  predniSONE (DELTASONE) 20 MG tablet 3 tabs po qd x 2 days, then 2 tabs po qd for 4 days, then 1 tab po qd for 4 days, then half a tab po qd for 3 days  04/24/15   Norval Gable, MD   Meds Ordered and Administered this Visit  Medications - No data to display  BP 133/57 mmHg  Pulse 79  Temp(Src) 98.8 F (37.1 C) (Tympanic)  Resp 16  Ht 5\' 3"  (1.6 m)  Wt 145 lb (65.772 kg)  BMI 25.69 kg/m2  SpO2 98% No data found.   Physical Exam  Constitutional: She appears well-developed and well-nourished. No distress.  HENT:  Head: Normocephalic and atraumatic.  Right Ear: Tympanic membrane, external ear and ear canal normal.  Left Ear: Tympanic membrane, external ear and ear canal normal.  Nose: Mucosal edema and rhinorrhea present. No nose lacerations, sinus tenderness, nasal deformity, septal deviation or nasal septal hematoma. No epistaxis.  No foreign bodies.  Mouth/Throat: Uvula is midline, oropharynx is clear and moist and mucous membranes are normal. No oropharyngeal exudate.  Eyes: Conjunctivae and EOM are normal. Pupils are equal, round, and reactive to light. Right eye exhibits no discharge. Left eye exhibits no discharge. No scleral icterus.  Neck: Normal range of motion. Neck supple. No thyromegaly present.  Cardiovascular: Normal rate, regular rhythm and normal heart sounds.   Pulmonary/Chest: Effort normal. No respiratory distress. She has wheezes (few expiratory wheezes bilaterally and bilateral rhonchi throughout). She has no rales.  Lymphadenopathy:    She has no cervical adenopathy.  Skin: She is not diaphoretic.  Nursing note and vitals reviewed.  ED Course  Procedures (including critical care time)  Labs Review Labs Reviewed - No data to display  Imaging Review No results found.   Visual Acuity Review  Right Eye Distance:   Left Eye Distance:   Bilateral Distance:    Right Eye Near:   Left Eye Near:    Bilateral Near:         MDM   1. COPD with acute exacerbation [facility])     Discharge Medication List as of 04/24/2015  9:43 AM    START taking these medications   Details  doxycycline (VIBRA-TABS)  100 MG tablet Take 1 tablet (100 mg total) by mouth 2 (two) times daily., Starting 04/24/2015, Until Discontinued, Normal    predniSONE (DELTASONE) 20 MG tablet 3 tabs po qd x 2 days, then 2 tabs po qd for 4 days, then 1 tab po qd for 4 days, then half a tab po qd for 3 days, Normal       1. diagnosis reviewed with patient 2. rx as per orders above; reviewed possible side effects, interactions, risks and benefits  3. Recommend supportive treatment with increased fluids, albuterol MDI (which patient has at home) 4. Follow-up prn if symptoms worsen or don't improve  Norval Gable, MD 04/24/15 [phone]

## 2015-11-24 DIAGNOSIS — K2289 Other specified disease of esophagus: Secondary | ICD-10-CM | POA: Insufficient documentation

## 2015-12-15 ENCOUNTER — Other Ambulatory Visit: Payer: Self-pay | Admitting: Family Medicine

## 2015-12-15 DIAGNOSIS — Z1231 Encounter for screening mammogram for malignant neoplasm of breast: Secondary | ICD-10-CM

## 2015-12-27 ENCOUNTER — Ambulatory Visit
Admission: RE | Admit: 2015-12-27 | Discharge: 2015-12-27 | Disposition: A | Discharge status: Home / Self Care | Payer: Medicare HMO | Source: Ambulatory Visit | Attending: Family Medicine | Admitting: Family Medicine

## 2015-12-27 ENCOUNTER — Other Ambulatory Visit: Payer: Self-pay | Admitting: Family Medicine

## 2015-12-27 DIAGNOSIS — Z1231 Encounter for screening mammogram for malignant neoplasm of breast: Secondary | ICD-10-CM

## 2016-02-14 ENCOUNTER — Encounter: Payer: Self-pay | Admitting: *Deleted

## 2016-02-15 ENCOUNTER — Encounter: Admission: RE | Payer: Self-pay | Source: Ambulatory Visit

## 2016-02-15 ENCOUNTER — Ambulatory Visit: Admission: RE | Admit: 2016-02-15 | Payer: Medicare HMO | Source: Ambulatory Visit | Admitting: Gastroenterology

## 2016-02-15 ENCOUNTER — Encounter: Payer: Self-pay | Admitting: Anesthesiology

## 2016-02-15 HISTORY — DX: Other specified disorders of bone density and structure, unspecified site: M85.80

## 2016-02-15 HISTORY — DX: Other intervertebral disc displacement, lumbar region: M51.26

## 2016-02-15 HISTORY — DX: Occlusion and stenosis of unspecified carotid artery: I65.29

## 2016-02-15 HISTORY — DX: Gastro-esophageal reflux disease without esophagitis: K21.9

## 2016-02-15 HISTORY — DX: Essential (primary) hypertension: I10

## 2016-02-15 HISTORY — DX: Diverticulosis of intestine, part unspecified, without perforation or abscess without bleeding: K57.90

## 2016-02-15 SURGERY — ESOPHAGOGASTRODUODENOSCOPY (EGD) WITH PROPOFOL
Anesthesia: General

## 2016-03-06 ENCOUNTER — Telehealth: Payer: Self-pay | Admitting: *Deleted

## 2016-03-06 NOTE — Telephone Encounter (Signed)
Received referral for initial lung cancer screening scan. Contacted patient and obtained smoking history,(current, 45 pack year) as well as answering questions related to screening process. Patient denies signs of lung cancer such as weight loss or hemoptysis. Patient denies comorbidity that would prevent curative treatment if lung cancer were found. Patient is tentatively scheduled for shared decision making visit and CT scan on 03/14/16 at 1:30pm, pending insurance approval from business office.

## 2016-03-07 ENCOUNTER — Encounter: Payer: Self-pay | Admitting: *Deleted

## 2016-03-14 ENCOUNTER — Other Ambulatory Visit: Payer: Self-pay

## 2016-03-14 ENCOUNTER — Inpatient Hospital Stay: Payer: Medicare HMO | Attending: Oncology | Admitting: Oncology

## 2016-03-14 ENCOUNTER — Ambulatory Visit
Admission: RE | Admit: 2016-03-14 | Discharge: 2016-03-14 | Disposition: A | Discharge status: Home / Self Care | Payer: Medicare HMO | Source: Ambulatory Visit | Attending: Oncology | Admitting: Oncology

## 2016-03-14 DIAGNOSIS — K760 Fatty (change of) liver, not elsewhere classified: Secondary | ICD-10-CM | POA: Diagnosis not present

## 2016-03-14 DIAGNOSIS — I719 Aortic aneurysm of unspecified site, without rupture: Secondary | ICD-10-CM | POA: Insufficient documentation

## 2016-03-14 DIAGNOSIS — I700 Atherosclerosis of aorta: Secondary | ICD-10-CM | POA: Insufficient documentation

## 2016-03-14 DIAGNOSIS — I2510 Atherosclerotic heart disease of native coronary artery without angina pectoris: Secondary | ICD-10-CM | POA: Diagnosis not present

## 2016-03-14 DIAGNOSIS — Z87891 Personal history of nicotine dependence: Secondary | ICD-10-CM

## 2016-03-14 DIAGNOSIS — J432 Centrilobular emphysema: Secondary | ICD-10-CM | POA: Diagnosis not present

## 2016-03-14 DIAGNOSIS — Z122 Encounter for screening for malignant neoplasm of respiratory organs: Secondary | ICD-10-CM

## 2016-03-14 HISTORY — DX: Personal history of nicotine dependence: Z87.891

## 2016-03-14 NOTE — Progress Notes (Signed)
In accordance with CMS guidelines, patient has met eligibility criteria including age, absence of signs or symptoms of lung cancer.  Social History  Substance Use Topics  . Smoking status: Current Every Day Smoker    Packs/day: 1.00    Years: 45.00    Types: Cigarettes  . Smokeless tobacco: Never Used  . Alcohol use No     A shared decision-making session was conducted prior to the performance of CT scan. This includes one or more decision aids, includes benefits and harms of screening, follow-up diagnostic testing, over-diagnosis, false positive rate, and total radiation exposure.  Counseling on the importance of adherence to annual lung cancer LDCT screening, impact of co-morbidities, and ability or willingness to undergo diagnosis and treatment is imperative for compliance of the program.  Counseling on the importance of continued smoking cessation for former smokers; the importance of smoking cessation for current smokers, and information about tobacco cessation interventions have been given to patient including [place] and [facility] quit [place] programs.  Written order for lung cancer screening with LDCT has been given to the patient and any and all questions have been answered to the best of my abilities.   Yearly follow up will be coordinated by Burgess Estelle, Thoracic Navigator.

## 2016-03-15 NOTE — Progress Notes (Signed)
  Oncology Nurse Navigator Documentation Results of LDCT mailed to Molly Matthews. Needs follow up scan in one year. Results also sent to PCP, Dr. Salome Holmes.  Navigator Location: [facility] (03/15/16 1300)   )Navigator Encounter Type: Letter/Fax/Email;Diagnostic Results (03/15/16 1300)                                                    Time Spent with Patient: 15 (03/15/16 1300)

## 2016-06-22 DIAGNOSIS — I6522 Occlusion and stenosis of left carotid artery: Secondary | ICD-10-CM | POA: Insufficient documentation

## 2016-07-17 DIAGNOSIS — R519 Headache, unspecified: Secondary | ICD-10-CM | POA: Insufficient documentation

## 2016-07-17 DIAGNOSIS — R5383 Other fatigue: Secondary | ICD-10-CM | POA: Insufficient documentation

## 2016-07-19 ENCOUNTER — Encounter
Admission: RE | Admit: 2016-07-19 | Discharge: 2016-07-19 | Disposition: A | Discharge status: Home / Self Care | Payer: Medicare HMO | Source: Ambulatory Visit | Attending: Surgery | Admitting: Surgery

## 2016-07-19 DIAGNOSIS — K648 Other hemorrhoids: Secondary | ICD-10-CM | POA: Diagnosis not present

## 2016-07-19 DIAGNOSIS — Z01812 Encounter for preprocedural laboratory examination: Secondary | ICD-10-CM | POA: Diagnosis not present

## 2016-07-19 HISTORY — DX: Esophagitis, unspecified without bleeding: K20.90

## 2016-07-19 HISTORY — DX: Cerebral infarction, unspecified: I63.9

## 2016-07-19 HISTORY — DX: Unspecified osteoarthritis, unspecified site: M19.90

## 2016-07-19 HISTORY — DX: Occlusion and stenosis of left carotid artery: I65.22

## 2016-07-19 HISTORY — DX: Vitamin D deficiency, unspecified: E55.9

## 2016-07-19 HISTORY — DX: Chronic obstructive pulmonary disease, unspecified: J44.9

## 2016-07-19 HISTORY — DX: Irritable bowel syndrome, unspecified: K58.9

## 2016-07-19 HISTORY — DX: Fatty (change of) liver, not elsewhere classified: K76.0

## 2016-07-19 HISTORY — DX: Esophagitis, unspecified: K20.9

## 2016-07-19 HISTORY — DX: Anxiety disorder, unspecified: F41.9

## 2016-07-19 HISTORY — DX: Peptic ulcer, site unspecified, unspecified as acute or chronic, without hemorrhage or perforation: K27.9

## 2016-07-19 LAB — CBC
HCT: 46.7 % (ref 35.0–47.0)
HEMOGLOBIN: 16 g/dL (ref 12.0–16.0)
MCH: 32.6 pg (ref 26.0–34.0)
MCHC: 34.4 g/dL (ref 32.0–36.0)
MCV: 94.7 fL (ref 80.0–100.0)
PLATELETS: 260 10*3/uL (ref 150–440)
RBC: 4.93 MIL/uL (ref 3.80–5.20)
RDW: 15.4 % — ABNORMAL HIGH (ref 11.5–14.5)
WBC: 12.1 10*3/uL — AB (ref 3.6–11.0)

## 2016-07-19 LAB — COMPREHENSIVE METABOLIC PANEL
ALK PHOS: 55 U/L (ref 38–126)
ALT: 24 U/L (ref 14–54)
ANION GAP: 8 (ref 5–15)
AST: 22 U/L (ref 15–41)
Albumin: 4.3 g/dL (ref 3.5–5.0)
BUN: 13 mg/dL (ref 6–20)
CALCIUM: 10 mg/dL (ref 8.9–10.3)
CO2: 29 mmol/L (ref 22–32)
CREATININE: 0.74 mg/dL (ref 0.44–1.00)
Chloride: 104 mmol/L (ref 101–111)
GFR calc Af Amer: >60 mL/min (ref >60)
Glucose, Bld: 105 mg/dL — ABNORMAL HIGH (ref 65–99)
Potassium: 4.1 mmol/L (ref 3.5–5.1)
SODIUM: 141 mmol/L (ref 135–145)
Total Bilirubin: 0.8 mg/dL (ref 0.3–1.2)
Total Protein: 7.5 g/dL (ref 6.5–8.1)

## 2016-07-19 LAB — DIFFERENTIAL
BASOS PCT: 1 %
Basophils Absolute: 0.1 10*3/uL (ref 0–0.1)
EOS PCT: 3 %
Eosinophils Absolute: 0.3 10*3/uL (ref 0–0.7)
Lymphocytes Relative: 23 %
Lymphs Abs: 2.8 10*3/uL (ref 1.0–3.6)
MONO ABS: 1 10*3/uL — AB (ref 0.2–0.9)
MONOS PCT: 8 %
NEUTROS PCT: 65 %
Neutro Abs: 7.8 10*3/uL — ABNORMAL HIGH (ref 1.4–6.5)

## 2016-07-19 NOTE — Patient Instructions (Signed)
Your procedure is scheduled CB:SWHQP 27, 2018 (Tuesday) Report to Same Day Surgery 2nd floor medical mall [facility] Entrance-take elevator on left to 2nd floor.  Check in with surgery information desk.) To find out your arrival time please call [phone] between 1PM - 3PM on July 24, 2016 (Monday)  Remember: Instructions that are not followed completely may result in serious medical risk, up to and including death, or upon the discretion of your surgeon and anesthesiologist your surgery may need to be rescheduled.    _x___ 1. Do not eat food or drink liquids after midnight. No gum chewing or hard candies.                               __x__ 2. No Alcohol for 24 hours before or after surgery.   __x__3. No Smoking for 24 prior to surgery.   ____  4. Bring all medications with you on the day of surgery if instructed.    __x__ 5. Notify your doctor if there is any change in your medical condition     (cold, fever, infections).     Do not wear jewelry, make-up, hairpins, clips or nail polish.  Do not wear lotions, powders, or perfumes. You may wear deodorant.  Do not shave 48 hours prior to surgery. Men may shave face and neck.  Do not bring valuables to the hospital.    [facility] is not responsible for any belongings or valuables.               Contacts, dentures or bridgework may not be worn into surgery.  Leave your suitcase in the car. After surgery it may be brought to your room.  For patients admitted to the hospital, discharge time is determined by your                       treatment team.   Patients discharged the day of surgery will not be allowed to drive home.  You will need someone to drive you home and stay with you the night of your procedure.    Please read over the following fact sheets that you were given:   [facility] Preparing for Surgery and or MRSA Information   _x___ Take anti-hypertensive (unless it includes a diuretic), cardiac, seizure, asthma,      anti-reflux and psychiatric medicines with a sip of water.These include:  1.  Pantoprazole (Pantoprazole at bedtime on Monday night)  2.  3.  4.  5.  6.  ____Fleets enema or Magnesium Citrate as directed.   _x___ Use CHG Soap or sage wipes as directed on instruction sheet   _x___ Use inhalers on the day of surgery and bring to hospital day of surgery (USE ALBUTEROL AND [place]av[place])  ____ Stop Metformin and Janumet 2 days prior to surgery.    ____ Take 1/2 of usual insulin dose the night before surgery and none on the morning  surgery   _x___ Follow recommendations from Cardiologist, Pulmonologist or PCP regarding          stopping Aspirin, Coumadin, Pllavix ,Eliquis, Effient, or Pradaxa, and Pletal. INSTRUCTED TO STOP PLAVIX FIVE DAYS PRIOR TO SURGERY AND ASPIRIN SEVEN DAYS PRIOR TO SURGERY BY DR. Tamala Julian)  X____Stop Anti-inflammatories such as Advil, Aleve, Ibuprofen, Motrin, Naproxen, Naprosyn, Goodies powders or aspirin products. OK to take Tylenol .  _x___ Stop supplements until after surgery.  But may continue Vitamin D, Vitamin B, and multivitamin       ____ Bring C-Pap to the hospital.

## 2016-07-19 NOTE — Pre-Procedure Instructions (Signed)
Surgical clearance on chart from Dr. Nehemiah Massed. Patient is at low risk for surgery.

## 2016-07-24 MED ORDER — FAMOTIDINE 20 MG PO TABS
ORAL_TABLET | ORAL | Status: AC
  Filled 2016-07-24: qty 1

## 2016-07-24 MED ORDER — CEFAZOLIN SODIUM-DEXTROSE 2-4 GM/100ML-% IV SOLN
INTRAVENOUS | Status: AC
  Filled 2016-07-24: qty 100

## 2016-07-25 ENCOUNTER — Ambulatory Visit: Payer: Medicare HMO | Admitting: Anesthesiology

## 2016-07-25 ENCOUNTER — Encounter: Payer: Self-pay | Admitting: *Deleted

## 2016-07-25 ENCOUNTER — Ambulatory Visit
Admission: RE | Admit: 2016-07-25 | Discharge: 2016-07-25 | Disposition: A | Discharge status: Home / Self Care | Payer: Medicare HMO | Source: Ambulatory Visit | Attending: Surgery | Admitting: Surgery

## 2016-07-25 ENCOUNTER — Encounter
Admission: RE | Disposition: A | Discharge status: Home / Self Care | Payer: Self-pay | Source: Ambulatory Visit | Attending: Surgery

## 2016-07-25 DIAGNOSIS — Z7902 Long term (current) use of antithrombotics/antiplatelets: Secondary | ICD-10-CM | POA: Diagnosis not present

## 2016-07-25 DIAGNOSIS — Z8673 Personal history of transient ischemic attack (TIA), and cerebral infarction without residual deficits: Secondary | ICD-10-CM | POA: Diagnosis not present

## 2016-07-25 DIAGNOSIS — F17210 Nicotine dependence, cigarettes, uncomplicated: Secondary | ICD-10-CM | POA: Diagnosis not present

## 2016-07-25 DIAGNOSIS — J449 Chronic obstructive pulmonary disease, unspecified: Secondary | ICD-10-CM | POA: Diagnosis not present

## 2016-07-25 DIAGNOSIS — I739 Peripheral vascular disease, unspecified: Secondary | ICD-10-CM | POA: Insufficient documentation

## 2016-07-25 DIAGNOSIS — Z79899 Other long term (current) drug therapy: Secondary | ICD-10-CM | POA: Diagnosis not present

## 2016-07-25 DIAGNOSIS — E785 Hyperlipidemia, unspecified: Secondary | ICD-10-CM | POA: Insufficient documentation

## 2016-07-25 DIAGNOSIS — K644 Residual hemorrhoidal skin tags: Secondary | ICD-10-CM | POA: Diagnosis not present

## 2016-07-25 DIAGNOSIS — F329 Major depressive disorder, single episode, unspecified: Secondary | ICD-10-CM | POA: Insufficient documentation

## 2016-07-25 DIAGNOSIS — K648 Other hemorrhoids: Secondary | ICD-10-CM | POA: Diagnosis not present

## 2016-07-25 DIAGNOSIS — I10 Essential (primary) hypertension: Secondary | ICD-10-CM | POA: Insufficient documentation

## 2016-07-25 DIAGNOSIS — K219 Gastro-esophageal reflux disease without esophagitis: Secondary | ICD-10-CM | POA: Diagnosis not present

## 2016-07-25 HISTORY — PX: HEMORRHOID SURGERY: SHX153

## 2016-07-25 SURGERY — HEMORRHOIDECTOMY
Anesthesia: General | Site: Rectum | Wound class: Clean Contaminated

## 2016-07-25 MED ORDER — ONDANSETRON HCL 4 MG/2ML IJ SOLN
INTRAMUSCULAR | Status: DC | PRN
  Administered 2016-07-25: 4 mg via INTRAVENOUS

## 2016-07-25 MED ORDER — OXYCODONE-ACETAMINOPHEN 5-325 MG PO TABS: 1.0000 | ORAL_TABLET | ORAL | 0 refills | Status: DC | PRN

## 2016-07-25 MED ORDER — PROPOFOL 10 MG/ML IV BOLUS
INTRAVENOUS | Status: AC
  Filled 2016-07-25: qty 20

## 2016-07-25 MED ORDER — FENTANYL CITRATE (PF) 100 MCG/2ML IJ SOLN
INTRAMUSCULAR | Status: DC | PRN
  Administered 2016-07-25 (×4): 25 ug via INTRAVENOUS

## 2016-07-25 MED ORDER — EPHEDRINE SULFATE 50 MG/ML IJ SOLN
INTRAMUSCULAR | Status: DC | PRN
Start: 2016-07-25 — End: 2016-07-25
  Administered 2016-07-25: 20 mg via INTRAVENOUS

## 2016-07-25 MED ORDER — PROPOFOL 10 MG/ML IV BOLUS
INTRAVENOUS | Status: DC | PRN
  Administered 2016-07-25: 140 mg via INTRAVENOUS

## 2016-07-25 MED ORDER — BUPIVACAINE-EPINEPHRINE (PF) 0.5% -1:200000 IJ SOLN
INTRAMUSCULAR | Status: DC | PRN
  Administered 2016-07-25: 11 mL via PERINEURAL

## 2016-07-25 MED ORDER — ONDANSETRON HCL 4 MG/2ML IJ SOLN: 4.0000 mg | Freq: Once | INTRAMUSCULAR | Status: DC | PRN

## 2016-07-25 MED ORDER — BUPIVACAINE-EPINEPHRINE (PF) 0.5% -1:200000 IJ SOLN
INTRAMUSCULAR | Status: AC
  Filled 2016-07-25: qty 30

## 2016-07-25 MED ORDER — LACTATED RINGERS IV SOLN
INTRAVENOUS | Status: DC
  Administered 2016-07-25: 06:00:00 via INTRAVENOUS

## 2016-07-25 MED ORDER — PHENYLEPHRINE HCL 10 MG/ML IJ SOLN
INTRAMUSCULAR | Status: DC | PRN
  Administered 2016-07-25 (×2): 50 ug via INTRAVENOUS

## 2016-07-25 MED ORDER — LIDOCAINE HCL (CARDIAC) 20 MG/ML IV SOLN
INTRAVENOUS | Status: DC | PRN
  Administered 2016-07-25: 50 mg via INTRAVENOUS

## 2016-07-25 MED ORDER — FENTANYL CITRATE (PF) 100 MCG/2ML IJ SOLN
INTRAMUSCULAR | Status: AC
  Filled 2016-07-25: qty 2

## 2016-07-25 MED ORDER — MIDAZOLAM HCL 2 MG/2ML IJ SOLN
INTRAMUSCULAR | Status: DC | PRN
  Administered 2016-07-25: 2 mg via INTRAVENOUS

## 2016-07-25 MED ORDER — OXYCODONE-ACETAMINOPHEN 5-325 MG PO TABS
ORAL_TABLET | ORAL | Status: AC
  Filled 2016-07-25: qty 1

## 2016-07-25 MED ORDER — BUPIVACAINE LIPOSOME 1.3 % IJ SUSP
INTRAMUSCULAR | Status: DC | PRN
  Administered 2016-07-25: 20 mL

## 2016-07-25 MED ORDER — SODIUM CHLORIDE 0.9 % IJ SOLN
INTRAMUSCULAR | Status: AC
Start: 2016-07-25 — End: 2016-07-25
  Filled 2016-07-25: qty 50

## 2016-07-25 MED ORDER — MIDAZOLAM HCL 2 MG/2ML IJ SOLN
INTRAMUSCULAR | Status: AC
  Filled 2016-07-25: qty 2

## 2016-07-25 MED ORDER — OXYCODONE-ACETAMINOPHEN 5-325 MG PO TABS
1.0000 | ORAL_TABLET | Freq: Once | ORAL | Status: AC
  Administered 2016-07-25: 1 via ORAL

## 2016-07-25 MED ORDER — FENTANYL CITRATE (PF) 100 MCG/2ML IJ SOLN: 25.0000 ug | INTRAMUSCULAR | Status: DC | PRN

## 2016-07-25 MED ORDER — BUPIVACAINE LIPOSOME 1.3 % IJ SUSP
INTRAMUSCULAR | Status: AC
  Filled 2016-07-25: qty 20

## 2016-07-25 MED ORDER — LACTATED RINGERS IV SOLN
INTRAVENOUS | Status: DC | PRN
  Administered 2016-07-25: 07:00:00 via INTRAVENOUS

## 2016-07-25 SURGICAL SUPPLY — 33 items
BLADE SURG 15 STRL LF DISP TIS (BLADE) ×1 IMPLANT
BLADE SURG 15 STRL SS (BLADE) ×2
CANISTER SUCT 1200ML W/VALVE (MISCELLANEOUS) ×3 IMPLANT
DRAPE LAPAROTOMY 100X77 ABD (DRAPES) ×3 IMPLANT
DRAPE LEGGINS SURG 28X43 STRL (DRAPES) ×3 IMPLANT
DRAPE UNDER BUTTOCK W/FLU (DRAPES) ×3 IMPLANT
ELECT REM PT RETURN 9FT ADLT (ELECTROSURGICAL) ×3
ELECTRODE REM PT RTRN 9FT ADLT (ELECTROSURGICAL) ×1 IMPLANT
GAUZE SPONGE 4X4 12PLY STRL (GAUZE/BANDAGES/DRESSINGS) ×3 IMPLANT
GLOVE BIO SURGEON STRL SZ7.5 (GLOVE) ×3 IMPLANT
GOWN STRL REUS W/ TWL LRG LVL3 (GOWN DISPOSABLE) ×2 IMPLANT
GOWN STRL REUS W/TWL LRG LVL3 (GOWN DISPOSABLE) ×4
HARMONIC SCALPEL FOCUS (MISCELLANEOUS) ×3 IMPLANT
LABEL OR SOLS (LABEL) ×3 IMPLANT
NEEDLE HYPO 25X1 1.5 SAFETY (NEEDLE) ×3 IMPLANT
NS IRRIG 500ML POUR BTL (IV SOLUTION) ×3 IMPLANT
PACK BASIN MINOR ARMC (MISCELLANEOUS) ×3 IMPLANT
PAD ABD DERMACEA PRESS 5X9 (GAUZE/BANDAGES/DRESSINGS) ×3 IMPLANT
PAD PREP 24X41 OB/GYN DISP (PERSONAL CARE ITEMS) ×3 IMPLANT
PENCIL ELECTRO HAND CTR (MISCELLANEOUS) ×3 IMPLANT
SLEEVE PROTECTION STRL DISP (MISCELLANEOUS) ×3 IMPLANT
SOL PREP PVP 2OZ (MISCELLANEOUS) ×3
SOLUTION PREP PVP 2OZ (MISCELLANEOUS) ×1 IMPLANT
SPONGE XRAY 4X4 16PLY STRL (MISCELLANEOUS) ×3 IMPLANT
STAPLER PROXIMATE HCS (STAPLE) IMPLANT
STRAP SAFETY BODY (MISCELLANEOUS) ×3 IMPLANT
SURGILUBE 2OZ TUBE FLIPTOP (MISCELLANEOUS) ×3 IMPLANT
SUT CHROMIC 3 0 SH 27 (SUTURE) ×3 IMPLANT
SUT CHROMIC 4 0 RB 1X27 (SUTURE) ×3 IMPLANT
SUT ETHILON 3-0 FS-10 30 BLK (SUTURE) ×3
SUT PROLENE 3 0 PS 2 (SUTURE) IMPLANT
SUTURE EHLN 3-0 FS-10 30 BLK (SUTURE) ×1 IMPLANT
SYRINGE 10CC LL (SYRINGE) ×3 IMPLANT

## 2016-07-25 NOTE — Anesthesia Preprocedure Evaluation (Signed)
Anesthesia Evaluation  Patient identified by MRN, date of birth, ID band Patient awake    Reviewed: Allergy & Precautions, H&P , NPO status , Patient's Chart, lab work & pertinent test results, reviewed documented beta blocker date and time   Airway Mallampati: IV  TM Distance: <3 FB Neck ROM: full    Dental  (+) Teeth Intact   Pulmonary neg pulmonary ROS, COPD,  COPD inhaler, Current Smoker,    Pulmonary exam normal        Cardiovascular Exercise Tolerance: Poor hypertension, On Medications + Peripheral Vascular Disease  negative cardio ROS Normal cardiovascular exam Rate:Normal     Neuro/Psych PSYCHIATRIC DISORDERS 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  JA CVA, No Residual Symptoms negative neurological ROS  negative psych ROS   GI/Hepatic negative GI ROS, Neg liver ROS, PUD, GERD  Medicated,  Endo/Other  negative endocrine ROS  Renal/GU negative Renal ROS  negative genitourinary   Musculoskeletal   Abdominal   Peds  Hematology negative hematology ROS (+)   Anesthesia Other Findings Refuses SAB after discussion of risks and benefits.  JA  Reproductive/Obstetrics negative OB ROS                             Anesthesia Physical Anesthesia Plan  ASA: III  Anesthesia Plan: General LMA   Post-op Pain Management:    Induction:   Airway Management Planned:   Additional Equipment:   Intra-op Plan:   Post-operative Plan:   Informed Consent: I have reviewed the patients History and Physical, chart, labs and discussed the procedure including the risks, benefits and alternatives for the proposed anesthesia with the patient or authorized representative who has indicated his/her understanding and acceptance.     Plan Discussed with: CRNA  Anesthesia Plan Comments:         Anesthesia Quick Evaluation

## 2016-07-25 NOTE — Anesthesia Postprocedure Evaluation (Signed)
Anesthesia Post Note  Patient: MASAKO OVERALL  Procedure(s) Performed: Procedure(s) (LRB): HEMORRHOIDECTOMY INTERNAL AND EXTERNAL (N/A)  Patient location during evaluation: PACU Anesthesia Type: General Level of consciousness: awake and alert Pain management: pain level controlled Vital Signs Assessment: post-procedure vital signs reviewed and stable Respiratory status: spontaneous breathing, nonlabored ventilation, respiratory function stable and patient connected to nasal cannula oxygen Cardiovascular status: blood pressure returned to baseline and stable Postop Assessment: no signs of nausea or vomiting Anesthetic complications: no     Last Vitals:  Vitals:   07/25/16 1006 07/25/16 1018  BP: 100/65 115/69  Pulse: 90 87  Resp: 11 20  Temp: 36.5 C 36.6 C    Last Pain:  Vitals:   07/25/16 1018  TempSrc: Temporal  PainSc: 10-Worst pain ever                 [name]

## 2016-07-25 NOTE — Discharge Instructions (Addendum)
Take Tylenol or oxycodone if needed for pain.  Should not drive or do anything dangerous when taking oxycodone.  Resume Plavix and aspirin on Wednesday.  Take MiraLAX 1 capful in beverage daily.  Remove dressing later today or tomorrow. May then shower and/or sit in warm water.  Tuck gauze or pad in underwear and change as needed for drainage.  Gradually resume usual activities as tolerated.   AMBULATORY SURGERY  DISCHARGE INSTRUCTIONS  1) The drugs that you were given will stay in your system until tomorrow so for the next 24 hours you should not: A) Drive an automobile B) Make any legal decisions C) Drink any alcoholic beverage  2) You may resume regular meals tomorrow.  Today it is better to start with liquids and gradually work up to solid foods. You may eat anything you prefer, but it is better to start with liquids, then soup and crackers, and gradually work up to solid foods.  3) Please notify your doctor immediately if you have any unusual bleeding, trouble breathing, redness and pain at the surgery site, drainage, fever, or pain not relieved by medication.  Please contact your physician with any problems or Same Day Surgery at [phone], Monday through Friday 6 am to 4 pm, or [place] at [facility] number at [phone].

## 2016-07-25 NOTE — Transfer of Care (Signed)
Immediate Anesthesia Transfer of Care Note  Patient: Molly Matthews  Procedure(s) Performed: Procedure(s): HEMORRHOIDECTOMY INTERNAL AND EXTERNAL (N/A)  Patient Location: PACU  Anesthesia Type:General  Level of Consciousness: awake and alert   Airway & Oxygen Therapy: Patient Spontanous Breathing and Patient connected to face mask oxygen  Post-op Assessment: Report given to RN  Post vital signs: Reviewed and stable  Last Vitals:  Vitals:   07/25/16 0606 07/25/16 0922  BP: 118/66 140/78  Pulse: 78 (!) 104  Resp: 16 16  Temp: 37.1 C 36.3 C    Last Pain:  Vitals:   07/25/16 0606  TempSrc: Tympanic         Complications: No apparent anesthesia complications

## 2016-07-25 NOTE — Op Note (Signed)
OPERATIVE REPORT  PREOPERATIVE  DIAGNOSIS: . Internal and external hemorrhoids  POSTOPERATIVE DIAGNOSIS: . Internal and external hemorrhoids  PROCEDURE: . Internal and external hemorrhoidectomy  ANESTHESIA:  General  SURGEON: Rochel Brome  MD   INDICATIONS: . She has a history of anal pain and itching and swelling. She had findings of large internal and external hemorrhoids on exam. Surgery was recommended for definitive treatment.  With the patient on the operating table in the supine position under general anesthesia the legs were elevated into the lithotomy position using ankle straps. The anal area was prepared with Betadine solution and draped with sterile towels and sheets.  Multiple external hemorrhoids were noted. The anoderm was infiltrated with half percent Sensorcaine with epinephrine. The anal canal was dilated large enough to admit 3 fingers. The bivalve anal retractor was introduced and noted large internal hemorrhoids at the 10:00 position and the 4:00 position. Initially the hemorrhoid at the 4:00 position was removed. A 3-0 chromic suture ligature was placed above the internal component. A V-shaped incision was made externally with a scalpel and dissection was begun with electrocautery. The hemorrhoid was elevated off the internal anal sphincter and dissected up to the previously placed suture ligature. The hemorrhoid was then ligated with the 3-0 chromic and amputated. The wound was inspected and several small bleeding points are cauterized. The wound was closed with a running locked tied 3-0 chromic stitch leaving a small opening externally for drainage. A similar procedure was carried out at the 10:00 position with a similar suture ligature excision and closure. An external hemorrhoid was removed at the 2:00 position and multiple small bleeding points cauterized and wound closed with interrupted 4-0 chromic. Another external hemorrhoid was removed from the 8:00 position and  multiple bleeding points cauterized and wound closed with interrupted 4-0 chromic. Another small external hemorrhoid skin tag at the 5:30 position was excised. Subsequently hemostasis was intact. The site was further prepared with Betadine solution and infiltrated with 20 cc of Exparel.. The site was dressed with folded cotton gauze and 2 inch paper tape. The patient appeared to be in satisfactory condition and was prepared for transfer to the recovery room  [place].D.

## 2016-07-25 NOTE — H&P (Signed)
  She comes today for hemorrhoidectomy.  She reports no change in condition since office exam  Discussed plan for surgery

## 2016-07-25 NOTE — Anesthesia Post-op Follow-up Note (Cosign Needed)
Anesthesia QCDR form completed.        

## 2016-07-25 NOTE — Anesthesia Procedure Notes (Signed)
Procedure Name: LMA Insertion Date/Time: 07/25/2016 7:30 AM Performed by: [email or web address], Duston Smolenski Pre-anesthesia Checklist: Patient identified, Suction available, Patient being monitored, Timeout performed and Emergency Drugs available Patient Re-evaluated:Patient Re-evaluated prior to inductionOxygen Delivery Method: Circle system utilized Preoxygenation: Pre-oxygenation with 100% oxygen Intubation Type: IV induction LMA: LMA inserted LMA Size: 4.0 Number of attempts: 1 Placement Confirmation: positive ETCO2,  CO2 detector and breath sounds checked- equal and bilateral Tube secured with: Tape

## 2016-07-26 ENCOUNTER — Encounter: Payer: Self-pay | Admitting: Surgery

## 2016-07-26 LAB — SURGICAL PATHOLOGY

## 2016-08-23 ENCOUNTER — Other Ambulatory Visit (INDEPENDENT_AMBULATORY_CARE_PROVIDER_SITE_OTHER): Payer: Self-pay | Admitting: Vascular Surgery

## 2016-08-23 DIAGNOSIS — I739 Peripheral vascular disease, unspecified: Secondary | ICD-10-CM

## 2016-08-23 DIAGNOSIS — I6522 Occlusion and stenosis of left carotid artery: Secondary | ICD-10-CM

## 2016-08-24 ENCOUNTER — Ambulatory Visit (INDEPENDENT_AMBULATORY_CARE_PROVIDER_SITE_OTHER): Payer: Medicare HMO | Admitting: Vascular Surgery

## 2016-08-24 ENCOUNTER — Ambulatory Visit (INDEPENDENT_AMBULATORY_CARE_PROVIDER_SITE_OTHER): Payer: Medicare HMO

## 2016-08-24 ENCOUNTER — Encounter (INDEPENDENT_AMBULATORY_CARE_PROVIDER_SITE_OTHER): Payer: Self-pay | Admitting: Vascular Surgery

## 2016-08-24 DIAGNOSIS — I6523 Occlusion and stenosis of bilateral carotid arteries: Secondary | ICD-10-CM

## 2016-08-24 DIAGNOSIS — I739 Peripheral vascular disease, unspecified: Secondary | ICD-10-CM | POA: Diagnosis not present

## 2016-08-24 DIAGNOSIS — E782 Mixed hyperlipidemia: Secondary | ICD-10-CM | POA: Diagnosis not present

## 2016-08-24 DIAGNOSIS — J449 Chronic obstructive pulmonary disease, unspecified: Secondary | ICD-10-CM | POA: Insufficient documentation

## 2016-08-24 DIAGNOSIS — I6529 Occlusion and stenosis of unspecified carotid artery: Secondary | ICD-10-CM | POA: Insufficient documentation

## 2016-08-24 DIAGNOSIS — E785 Hyperlipidemia, unspecified: Secondary | ICD-10-CM | POA: Insufficient documentation

## 2016-08-24 DIAGNOSIS — I6522 Occlusion and stenosis of left carotid artery: Secondary | ICD-10-CM

## 2016-08-24 DIAGNOSIS — I10 Essential (primary) hypertension: Secondary | ICD-10-CM | POA: Insufficient documentation

## 2016-08-24 NOTE — Progress Notes (Signed)
MRN : [id number]  Molly Matthews is a 66 y.o. (11/28/1950) female who presents with chief complaint of  Chief Complaint  Patient presents with  . Follow-up  .  History of Present Illness: The patient returns to the office for followup and review of the noninvasive studies regarding her Carotid stenosis and her PAD of the lower extremities. There have been no interval changes in lower extremity symptoms. No interval shortening of the patient's claudication distance or development of rest pain symptoms. No new ulcers or wounds have occurred since the last visit.  The patient denies amaurosis fugax or recent TIA symptoms. There are no recent neurological changes noted.  There have been no significant changes to the patient's overall health care.  The patient denies history of DVT, PE or superficial thrombophlebitis. The patient denies recent episodes of angina or shortness of breath.   ABI Rt=1.00 and Lt=1.03   Duplex ultrasound of the carotid arteries RICA widely patent s/p CEA and known occlusion; stable left subclavian stenosis  Current Meds  Medication Sig  . albuterol (PROVENTIL HFA;VENTOLIN HFA) 108 (90 Base) MCG/ACT inhaler Inhale 2 puffs into the lungs every 6 (six) hours as needed for wheezing or shortness of breath.  Marland Kitchen aspirin EC 81 MG tablet Take 81 mg by mouth daily.  . citalopram (CELEXA) 40 MG tablet Take 40 mg by mouth at bedtime.   . clopidogrel (PLAVIX) 75 MG tablet Take 75 mg by mouth daily.  . montelukast (SINGULAIR) 10 MG tablet Take 10 mg by mouth at bedtime.  . nicotine (NICODERM CQ - DOSED IN MG/24 HOURS) 21 mg/24hr patch Place 21 mg onto the skin daily as needed.  . pantoprazole (PROTONIX) 40 MG tablet Take 40 mg by mouth every morning.   . rosuvastatin (CRESTOR) 5 MG tablet Take 2.5 mg by mouth at bedtime.   Marland Kitchen umeclidinium-vilanterol (ANORO ELLIPTA) 62.5-25 MCG/INH AEPB Inhale 1 puff into the lungs daily as needed (Short of breath).     Past Medical History:   Diagnosis Date  . Anxiety   . Arthritis   . Carotid artery stenosis   . Carotid stenosis, left    100 % blockage  . COPD (chronic obstructive pulmonary disease) (HCC)    mild  . Depression   . Diverticulosis   . Esophagitis   . Fatty liver disease, nonalcoholic   . GERD (gastroesophageal reflux disease)   . Herniated lumbar intervertebral disc   . Hyperlipidemia   . Hypertension   . IBS (irritable bowel syndrome)   . Osteopenia   . Peptic ulcer disease   . Personal history of tobacco use, presenting hazards to health 03/14/2016  . Stroke ([place])    TIA's X 2  . Vitamin D deficiency     Past Surgical History:  Procedure Laterality Date  . ABDOMINAL HYSTERECTOMY    . BREAST BIOPSY Left    negative 2008  . CHOLECYSTECTOMY    . DILATION AND CURETTAGE OF UTERUS    . HEMORRHOID SURGERY N/A 07/25/2016   Procedure: HEMORRHOIDECTOMY INTERNAL AND EXTERNAL;  Surgeon: Leonie Green, MD;  Location: [facility] ORS;  Service: General;  Laterality: N/A;  . VASCULAR SURGERY Right    Carotid Endarterectomy     Social History Social History  Substance Use Topics  . Smoking status: Current Every Day Smoker    Packs/day: 1.00    Years: 45.00    Types: Cigarettes  . Smokeless tobacco: Never Used  . Alcohol use No  Family History Family History  Problem Relation Age of Onset  . Breast cancer Mother 75  . Atrial fibrillation Mother   . Breast cancer Sister 40  . Heart attack Father     Allergies  Allergen Reactions  . Statins Other (See Comments)    Muscle and joint aches - can take in small doses  . Zithromax [Azithromycin] Other (See Comments)    Back Pain  . Sulfur Rash     REVIEW OF SYSTEMS (Negative unless checked)  Constitutional: [] Weight loss  [] Fever  [] Chills Cardiac: [] Chest pain   [] Chest pressure   [] Palpitations   [] Shortness of breath when laying flat   [] Shortness of breath with exertion. Vascular:  [x] Pain in legs with walking   [] Pain in legs at rest   [] History of DVT   [] Phlebitis   [] Swelling in legs   [] Varicose veins   [] Non-healing ulcers Pulmonary:   [] Uses home oxygen   [] Productive cough   [] Hemoptysis   [] Wheeze  [] COPD   [] Asthma Neurologic:  [] Dizziness   [] Seizures   [] History of stroke   [] History of TIA  [] Aphasia   [] Vissual changes   [] Weakness or numbness in arm   [] Weakness or numbness in leg Musculoskeletal:   [] Joint swelling   [] Joint pain   [] Low back pain Hematologic:  [] Easy bruising  [] Easy bleeding   [] Hypercoagulable state   [] Anemic Gastrointestinal:  [] Diarrhea   [] Vomiting  [] Gastroesophageal reflux/heartburn   [] Difficulty swallowing. Genitourinary:  [] Chronic kidney disease   [] Difficult urination  [] Frequent urination   [] Blood in urine Skin:  [] Rashes   [] Ulcers  Psychological:  [] History of anxiety   []  History of major depression.  Physical Examination  Vitals:   08/24/16 1443  BP: 139/76  Pulse: 87  Resp: 16  Weight: 68 kg (150 lb)   Body mass index is 26.57 kg/m. Gen: WD/WN, NAD Head: [place]/AT, No temporalis wasting.  Ear/Nose/Throat: Hearing grossly intact, nares w/o erythema or drainage Eyes: PER, EOMI, sclera nonicteric.  Neck: Supple, no large masses.   Pulmonary:  Good air movement, no audible wheezing bilaterally, no use of accessory muscles.  Cardiac: RRR, no JVD Vascular:  Bilateral carotid bruits Vessel Right Left  Radial Palpable Palpable  Ulnar Palpable Palpable  Brachial Palpable Palpable  Carotid Palpable Palpable  Femoral Palpable Palpable  Popliteal Palpable Palpable  PT 1+ Palpable 1+ Palpable  DP Trace Palpable Trace Palpable  Gastrointestinal: Non-distended. No guarding/no peritoneal signs.  Musculoskeletal: M/S 5/5 throughout.  No deformity or atrophy.  Neurologic: CN 2-12 intact. Symmetrical.  Speech is fluent. Motor exam as listed above. Psychiatric: Judgment intact, Mood & affect appropriate for pt's clinical situation. Dermatologic: No rashes or ulcers noted.  No  changes consistent with cellulitis. Lymph : No lichenification or skin changes of chronic lymphedema.  CBC Lab Results  Component Value Date   WBC 12.1 (H) 07/19/2016   HGB 16.0 07/19/2016   HCT 46.7 07/19/2016   MCV 94.7 07/19/2016   PLT 260 07/19/2016    BMET    Component Value Date/Time   NA 141 07/19/2016 1143   NA 142 01/29/2014 2124   K 4.1 07/19/2016 1143   K 4.0 01/29/2014 2124   CL 104 07/19/2016 1143   CL 110 (H) 01/29/2014 2124   CO2 29 07/19/2016 1143   CO2 25 01/29/2014 2124   GLUCOSE 105 (H) 07/19/2016 1143   GLUCOSE 137 (H) 01/29/2014 2124   BUN 13 07/19/2016 1143   BUN 10 01/29/2014 2124  CREATININE 0.74 07/19/2016 1143   CREATININE 1.00 01/29/2014 2124   CALCIUM 10.0 07/19/2016 1143   CALCIUM 9.3 01/29/2014 2124   GFRNONAA >60 07/19/2016 1143   GFRNONAA 59 (L) 01/29/2014 2124   GFRNONAA >60 12/22/2013 1159   GFRAA >60 07/19/2016 1143   GFRAA >60 01/29/2014 2124   GFRAA >60 12/22/2013 1159   CrCl cannot be calculated (Patient's most recent lab result is older than the maximum 21 days allowed.).  COAG No results found for: INR, PROTIME  Radiology No results found.  Assessment/Plan 1. Bilateral carotid artery stenosis Recommend:  Given the patient's asymptomatic subcritical stenosis no further invasive testing or surgery at this time.  Duplex ultrasound shows widely patent right CEA and known occlusion of the left stenosis bilaterally.  Continue antiplatelet therapy as prescribed Continue management of CAD, HTN and Hyperlipidemia Healthy heart diet,  encouraged exercise at least 4 times per week Follow up in 12 months with duplex ultrasound and physical exam based on <50% stenosis of the right carotid artery    2. PAD (peripheral artery disease) (HCC)  Recommend:  The patient has evidence of atherosclerosis of the lower extremities with claudication.  The patient does not voice lifestyle limiting changes at this point in  time.  Noninvasive studies do not suggest clinically significant change.  No invasive studies, angiography or surgery at this time The patient should continue walking and begin a more formal exercise program.  The patient should continue antiplatelet therapy and aggressive treatment of the lipid abnormalities  No changes in the patient's medications at this time  The patient should continue wearing graduated compression socks 10-15 mmHg strength to control the mild edema.    3. COPD mixed type ([place]) Continue pulmonary medications and aerosols as already ordered, these medications have been reviewed and there are no changes at this time.    4. Mixed hyperlipidemia Continue statin as ordered and reviewed, no changes at this time     Hortencia Pilar, MD  08/24/2016 3:21 PM

## 2016-09-14 DIAGNOSIS — Z7901 Long term (current) use of anticoagulants: Secondary | ICD-10-CM | POA: Insufficient documentation

## 2016-12-21 ENCOUNTER — Other Ambulatory Visit: Payer: Self-pay | Admitting: Family Medicine

## 2016-12-21 DIAGNOSIS — Z1231 Encounter for screening mammogram for malignant neoplasm of breast: Secondary | ICD-10-CM

## 2016-12-22 ENCOUNTER — Other Ambulatory Visit: Payer: Self-pay | Admitting: Gastroenterology

## 2016-12-22 DIAGNOSIS — R1084 Generalized abdominal pain: Secondary | ICD-10-CM

## 2016-12-22 DIAGNOSIS — R6881 Early satiety: Secondary | ICD-10-CM

## 2017-01-02 ENCOUNTER — Ambulatory Visit
Admission: RE | Admit: 2017-01-02 | Discharge: 2017-01-02 | Disposition: A | Discharge status: Home / Self Care | Payer: Medicare HMO | Source: Ambulatory Visit | Attending: Family Medicine | Admitting: Family Medicine

## 2017-01-02 DIAGNOSIS — Z1231 Encounter for screening mammogram for malignant neoplasm of breast: Secondary | ICD-10-CM | POA: Diagnosis present

## 2017-01-04 DIAGNOSIS — R072 Precordial pain: Secondary | ICD-10-CM | POA: Insufficient documentation

## 2017-01-22 ENCOUNTER — Ambulatory Visit
Admission: RE | Admit: 2017-01-22 | Discharge: 2017-01-22 | Disposition: A | Discharge status: Home / Self Care | Payer: Medicare HMO | Source: Ambulatory Visit | Attending: Gastroenterology | Admitting: Gastroenterology

## 2017-01-22 DIAGNOSIS — I700 Atherosclerosis of aorta: Secondary | ICD-10-CM | POA: Insufficient documentation

## 2017-01-22 DIAGNOSIS — R6881 Early satiety: Secondary | ICD-10-CM | POA: Diagnosis present

## 2017-01-22 DIAGNOSIS — R1084 Generalized abdominal pain: Secondary | ICD-10-CM

## 2017-01-22 DIAGNOSIS — K760 Fatty (change of) liver, not elsewhere classified: Secondary | ICD-10-CM | POA: Insufficient documentation

## 2017-01-22 MED ORDER — IOPAMIDOL (ISOVUE-300) INJECTION 61%
100.0000 mL | Freq: Once | INTRAVENOUS | Status: AC | PRN
  Administered 2017-01-22: 100 mL via INTRAVENOUS

## 2017-01-26 ENCOUNTER — Ambulatory Visit
Admission: RE | Admit: 2017-01-26 | Discharge: 2017-01-26 | Disposition: A | Discharge status: Home / Self Care | Payer: Medicare HMO | Source: Ambulatory Visit | Attending: Internal Medicine | Admitting: Internal Medicine

## 2017-01-26 ENCOUNTER — Ambulatory Visit: Payer: Medicare HMO | Admitting: Anesthesiology

## 2017-01-26 ENCOUNTER — Encounter
Admission: RE | Disposition: A | Discharge status: Home / Self Care | Payer: Self-pay | Source: Ambulatory Visit | Attending: Internal Medicine

## 2017-01-26 ENCOUNTER — Encounter: Payer: Self-pay | Admitting: *Deleted

## 2017-01-26 DIAGNOSIS — Z79899 Other long term (current) drug therapy: Secondary | ICD-10-CM | POA: Insufficient documentation

## 2017-01-26 DIAGNOSIS — F329 Major depressive disorder, single episode, unspecified: Secondary | ICD-10-CM | POA: Diagnosis not present

## 2017-01-26 DIAGNOSIS — R6881 Early satiety: Secondary | ICD-10-CM | POA: Insufficient documentation

## 2017-01-26 DIAGNOSIS — E785 Hyperlipidemia, unspecified: Secondary | ICD-10-CM | POA: Diagnosis not present

## 2017-01-26 DIAGNOSIS — R1310 Dysphagia, unspecified: Secondary | ICD-10-CM | POA: Diagnosis present

## 2017-01-26 DIAGNOSIS — J449 Chronic obstructive pulmonary disease, unspecified: Secondary | ICD-10-CM | POA: Insufficient documentation

## 2017-01-26 DIAGNOSIS — M8580 Other specified disorders of bone density and structure, unspecified site: Secondary | ICD-10-CM | POA: Insufficient documentation

## 2017-01-26 DIAGNOSIS — K219 Gastro-esophageal reflux disease without esophagitis: Secondary | ICD-10-CM | POA: Insufficient documentation

## 2017-01-26 DIAGNOSIS — K589 Irritable bowel syndrome without diarrhea: Secondary | ICD-10-CM | POA: Diagnosis not present

## 2017-01-26 DIAGNOSIS — E559 Vitamin D deficiency, unspecified: Secondary | ICD-10-CM | POA: Diagnosis not present

## 2017-01-26 DIAGNOSIS — K2950 Unspecified chronic gastritis without bleeding: Secondary | ICD-10-CM | POA: Diagnosis not present

## 2017-01-26 DIAGNOSIS — F17200 Nicotine dependence, unspecified, uncomplicated: Secondary | ICD-10-CM | POA: Insufficient documentation

## 2017-01-26 DIAGNOSIS — F419 Anxiety disorder, unspecified: Secondary | ICD-10-CM | POA: Insufficient documentation

## 2017-01-26 DIAGNOSIS — Z7982 Long term (current) use of aspirin: Secondary | ICD-10-CM | POA: Insufficient documentation

## 2017-01-26 DIAGNOSIS — Z8673 Personal history of transient ischemic attack (TIA), and cerebral infarction without residual deficits: Secondary | ICD-10-CM | POA: Insufficient documentation

## 2017-01-26 DIAGNOSIS — K3189 Other diseases of stomach and duodenum: Secondary | ICD-10-CM | POA: Diagnosis not present

## 2017-01-26 DIAGNOSIS — K760 Fatty (change of) liver, not elsewhere classified: Secondary | ICD-10-CM | POA: Insufficient documentation

## 2017-01-26 HISTORY — PX: ESOPHAGOGASTRODUODENOSCOPY (EGD) WITH PROPOFOL: SHX5813

## 2017-01-26 SURGERY — ESOPHAGOGASTRODUODENOSCOPY (EGD) WITH PROPOFOL
Anesthesia: General

## 2017-01-26 MED ORDER — FENTANYL CITRATE (PF) 100 MCG/2ML IJ SOLN
INTRAMUSCULAR | Status: AC
  Filled 2017-01-26: qty 2

## 2017-01-26 MED ORDER — GLYCOPYRROLATE 0.2 MG/ML IJ SOLN
INTRAMUSCULAR | Status: AC
Start: 2017-01-26 — End: ?
  Filled 2017-01-26: qty 1

## 2017-01-26 MED ORDER — FENTANYL CITRATE (PF) 100 MCG/2ML IJ SOLN
INTRAMUSCULAR | Status: DC | PRN
  Administered 2017-01-26: 25 ug via INTRAVENOUS

## 2017-01-26 MED ORDER — IPRATROPIUM-ALBUTEROL 0.5-2.5 (3) MG/3ML IN SOLN
3.0000 mL | Freq: Once | RESPIRATORY_TRACT | Status: AC
  Administered 2017-01-26: 3 mL via RESPIRATORY_TRACT

## 2017-01-26 MED ORDER — PROPOFOL 500 MG/50ML IV EMUL
INTRAVENOUS | Status: DC | PRN
  Administered 2017-01-26: 140 ug/kg/min via INTRAVENOUS

## 2017-01-26 MED ORDER — MIDAZOLAM HCL 2 MG/2ML IJ SOLN
INTRAMUSCULAR | Status: DC | PRN
  Administered 2017-01-26: 1 mg via INTRAVENOUS

## 2017-01-26 MED ORDER — MIDAZOLAM HCL 2 MG/2ML IJ SOLN
INTRAMUSCULAR | Status: AC
  Filled 2017-01-26: qty 2

## 2017-01-26 MED ORDER — SODIUM CHLORIDE 0.9 % IV SOLN
INTRAVENOUS | Status: DC
  Administered 2017-01-26: 1000 mL via INTRAVENOUS

## 2017-01-26 MED ORDER — PROPOFOL 10 MG/ML IV BOLUS
INTRAVENOUS | Status: DC | PRN
  Administered 2017-01-26: 70 mg via INTRAVENOUS

## 2017-01-26 MED ORDER — LIDOCAINE HCL (CARDIAC) 20 MG/ML IV SOLN
INTRAVENOUS | Status: DC | PRN
  Administered 2017-01-26: 40 mg via INTRAVENOUS

## 2017-01-26 MED ORDER — IPRATROPIUM-ALBUTEROL 0.5-2.5 (3) MG/3ML IN SOLN
RESPIRATORY_TRACT | Status: AC
  Administered 2017-01-26: 3 mL via RESPIRATORY_TRACT
  Filled 2017-01-26: qty 3

## 2017-01-26 MED ORDER — PROPOFOL 500 MG/50ML IV EMUL
INTRAVENOUS | Status: AC
  Filled 2017-01-26: qty 50

## 2017-01-26 MED ORDER — GLYCOPYRROLATE 0.2 MG/ML IJ SOLN
INTRAMUSCULAR | Status: DC | PRN
  Administered 2017-01-26: 0.2 mg via INTRAVENOUS

## 2017-01-26 NOTE — Anesthesia Postprocedure Evaluation (Signed)
Anesthesia Post Note  Patient: Molly Matthews  Procedure(s) Performed: Procedure(s) (LRB): ESOPHAGOGASTRODUODENOSCOPY (EGD) WITH PROPOFOL (N/A)  Patient location during evaluation: Endoscopy Anesthesia Type: General Level of consciousness: awake and alert Pain management: pain level controlled Vital Signs Assessment: post-procedure vital signs reviewed and stable Respiratory status: spontaneous breathing and respiratory function stable Cardiovascular status: stable Anesthetic complications: no     Last Vitals:  Vitals:   01/26/17 1530 01/26/17 1531  BP: (!) 115/59 (!) 111/59  Pulse: 93 93  Resp: 20 19  Temp: (!) 36.2 C (!) 36.2 C  SpO2: 95% 95%    Last Pain:  Vitals:   01/26/17 1531  TempSrc: Temporal  PainSc:                  Jaydrian Corpening K

## 2017-01-26 NOTE — Anesthesia Preprocedure Evaluation (Signed)
Anesthesia Evaluation  Patient identified by MRN, date of birth, ID band Patient awake    Reviewed: Allergy & Precautions, NPO status , Patient's Chart, lab work & pertinent test results  History of Anesthesia Complications Negative for: history of anesthetic complications  Airway Mallampati: II  TM Distance: >3 FB Neck ROM: Full    Dental  (+) Poor Dentition   Pulmonary COPD, Current Smoker,    breath sounds clear to auscultation- rhonchi (-) wheezing      Cardiovascular Exercise Tolerance: Good (-) hypertension(-) CAD, (-) Past MI and (-) Cardiac Stents  Rhythm:Regular Rate:Normal - Systolic murmurs and - Diastolic murmurs    Neuro/Psych PSYCHIATRIC DISORDERS Anxiety Depression TIA   GI/Hepatic Neg liver ROS, PUD, GERD  ,  Endo/Other  negative endocrine ROSneg diabetes  Renal/GU negative Renal ROS     Musculoskeletal  (+) Arthritis ,   Abdominal (+) - obese,   Peds  Hematology negative hematology ROS (+)   Anesthesia Other Findings Past Medical History: No date: Anxiety No date: Arthritis No date: Carotid artery stenosis No date: Carotid stenosis, left     Comment:  100 % blockage No date: COPD (chronic obstructive pulmonary disease) (HCC)     Comment:  mild No date: Depression No date: Diverticulosis No date: Esophagitis No date: Fatty liver disease, nonalcoholic No date: GERD (gastroesophageal reflux disease) No date: Herniated lumbar intervertebral disc No date: Hyperlipidemia No date: IBS (irritable bowel syndrome) No date: Osteopenia No date: Peptic ulcer disease 03/14/2016: Personal history of tobacco use, presenting hazards to  health No date: Stroke ([place])     Comment:  TIA's X 2 No date: Vitamin D deficiency   Reproductive/Obstetrics                             Anesthesia Physical Anesthesia Plan  ASA: III  Anesthesia Plan: General   Post-op Pain Management:     Induction: Intravenous  PONV Risk Score and Plan: 1 and Propofol infusion  Airway Management Planned: Natural Airway  Additional Equipment:   Intra-op Plan:   Post-operative Plan:   Informed Consent: I have reviewed the patients History and Physical, chart, labs and discussed the procedure including the risks, benefits and alternatives for the proposed anesthesia with the patient or authorized representative who has indicated his/her understanding and acceptance.   Dental advisory given  Plan Discussed with: CRNA and Anesthesiologist  Anesthesia Plan Comments:         Anesthesia Quick Evaluation

## 2017-01-26 NOTE — Transfer of Care (Signed)
Immediate Anesthesia Transfer of Care Note  Patient: Molly Matthews  Procedure(s) Performed: Procedure(s): ESOPHAGOGASTRODUODENOSCOPY (EGD) WITH PROPOFOL (N/A)  Patient Location: PACU  Anesthesia Type:General  Level of Consciousness: awake, alert  and oriented  Airway & Oxygen Therapy: Patient Spontanous Breathing and Patient connected to face mask oxygen  Post-op Assessment: Report given to RN and Post -op Vital signs reviewed and stable  Post vital signs: Reviewed and stable  Last Vitals:  Vitals:   01/26/17 1530 01/26/17 1531  BP: (!) 115/59 (!) 111/59  Pulse: 93 93  Resp: 20 19  Temp: (!) 36.2 C (!) 36.2 C  SpO2: 95% 95%    Last Pain:  Vitals:   01/26/17 1531  TempSrc: Temporal  PainSc:       Patients Stated Pain Goal: 0 (66/06/00 4599)  Complications: No apparent anesthesia complications

## 2017-01-26 NOTE — Anesthesia Post-op Follow-up Note (Signed)
Anesthesia QCDR form completed.        

## 2017-01-26 NOTE — Op Note (Signed)
[facility] Patient Name: Molly Matthews Procedure Date: 01/26/2017 3:11 PM MRN: [id number] Account #: [id number] Date of Birth: July 23, 1950 Admit Type: Outpatient Age: 66 Room: [facility] ENDO ROOM 4 Gender: Female Note Status: Finalized Procedure:            Upper GI endoscopy Indications:          Odynophagia, Upper abdominal symptoms that persist                        despite an appropriate trial of therapy, Early satiety Providers:            Benay Pike. Alice Reichert MD, MD Referring MD:         Rubbie Battiest. Iona Beard MD, MD (Referring MD) Medicines:            Propofol per Anesthesia Complications:        No immediate complications. Procedure:            Pre-Anesthesia Assessment:                       - ASA Grade Assessment: III - A patient with severe                        systemic disease.                       - After reviewing the risks and benefits, the patient                        was deemed in satisfactory condition to undergo the                        procedure.                       After obtaining informed consent, the endoscope was                        passed under direct vision. Throughout the procedure,                        the patient's blood pressure, pulse, and oxygen                        saturations were monitored continuously. The Endoscope                        was introduced through the mouth, and advanced to the                        third part of duodenum. The upper GI endoscopy was                        accomplished without difficulty. The patient tolerated                        the procedure well. Findings:      No endoscopic abnormality was evident in the esophagus to explain the       patient's complaint of dysphagia. Biopsies were obtained from the       proximal and distal esophagus with cold forceps for  histology of       suspected eosinophilic esophagitis. The lesion was not amenable to       dilation, and this was  not attempted.      Localized mildly erythematous mucosa without bleeding was found in the       stomach. Biopsies were taken with a cold forceps for Helicobacter pylori       testing.      The cardia and gastric fundus were normal on retroflexion.      The examined duodenum was normal. Impression:           - No endoscopic esophageal abnormality to explain                        patient's dysphagia. Biopsied. Lesion not amenable to                        dilation, and not attempted.                       - Erythematous mucosa in the stomach. Biopsied.                       - Normal examined duodenum. Recommendation:       - Await pathology results.                       - Return to GI office in 3 months.                       - Patient has a contact number available for                        emergencies. The signs and symptoms of potential                        delayed complications were discussed with the patient.                        Return to normal activities tomorrow. Written discharge                        instructions were provided to the patient.                       - Resume previous diet.                       - Continue present medications.                       - Discharge patient to home (with spouse).                       - The findings and recommendations were discussed with                        the patient.                       - The findings and recommendations were discussed with  the patient's family. Procedure Code(s):    --- Professional ---                       [phone], Esophagogastroduodenoscopy, flexible, transoral;                        with biopsy, single or multiple Diagnosis Code(s):    --- Professional ---                       R13.10, Dysphagia, unspecified                       K31.89, Other diseases of stomach and duodenum                       R19.8, Other specified symptoms and signs involving the                         digestive system and abdomen                       R68.81, Early satiety CPT copyright 2016 American Medical Association. All rights reserved. The codes documented in this report are preliminary and upon coder review may  be revised to meet current compliance requirements. Efrain Sella MD, MD 01/26/2017 3:29:16 PM This report has been signed electronically. Number of Addenda: 0 Note Initiated On: 01/26/2017 3:11 PM      [facility]

## 2017-01-26 NOTE — H&P (Signed)
Outpatient short stay form Pre-procedure 01/26/2017 3:06 PM Molly Matthews K. Molly Matthews, M.D.  Primary Physician: Salome Holmes, M.D.  Reason for visit:  Epigastric pain, early satiety, Odynophagia, chronic GERD.  History of present illness:  The patient is a pleasant 66 year old female presented for epigastric pain, odynophagia, and RD for several years diet aphasia last severa and claims that sometimes food gets stuck with regurgitation afterward. She has had ight loss, on the contrary, he has had some weight gain. No hematemesis. Patient complains she gets full "after only eating 3 chicken nuggets". She takes Plavix for history of stroke and  Been cleared by her neurologist told lavix for 5     Current Facility-Administered Medications:  .  0.9 %  sodium chloride infusion, , Intravenous, Continuous, [place], Benay Pike, MD, Last Rate: 20 mL/hr at 01/26/17 1426, 1,000 mL at 01/26/17 1426  Prescriptions Prior to Admission  Medication Sig Dispense Refill Last Dose  . albuterol (PROVENTIL HFA;VENTOLIN HFA) 108 (90 Base) MCG/ACT inhaler Inhale 2 puffs into the lungs every 6 (six) hours as needed for wheezing or shortness of breath.   Past Week at Unknown time  . aspirin EC 81 MG tablet Take 81 mg by mouth daily.   01/25/2017 at Unknown time  . citalopram (CELEXA) 40 MG tablet Take 40 mg by mouth at bedtime.    01/25/2017 at Unknown time  . clopidogrel (PLAVIX) 75 MG tablet Take 75 mg by mouth daily.   Past Week at Unknown time  . montelukast (SINGULAIR) 10 MG tablet Take 10 mg by mouth at bedtime.   Past Week at Unknown time  . pantoprazole (PROTONIX) 40 MG tablet Take 40 mg by mouth every morning.    01/25/2017 at Unknown time  . rosuvastatin (CRESTOR) 5 MG tablet Take 2.5 mg by mouth at bedtime.    01/25/2017 at Unknown time  . umeclidinium-vilanterol (ANORO ELLIPTA) 62.5-25 MCG/INH AEPB Inhale 1 puff into the lungs daily as needed (Short of breath).    Past Week at Unknown time  . nicotine (NICODERM CQ -  DOSED IN MG/24 HOURS) 21 mg/24hr patch Place 21 mg onto the skin daily as needed.   Not Taking at Unknown time  . oxyCODONE-acetaminophen (ROXICET) 5-325 MG tablet Take 1-2 tablets by mouth every 4 (four) hours as needed for moderate pain or severe pain. (Patient not taking: Reported on 01/26/2017) 24 tablet 0 Not Taking at Unknown time     Allergies  Allergen Reactions  . Statins Other (See Comments)    Muscle and joint aches - can take in small doses  . Zithromax [Azithromycin] Other (See Comments)    Back Pain  . Sulfur Rash     Past Medical History:  Diagnosis Date  . Anxiety   . Arthritis   . Carotid artery stenosis   . Carotid stenosis, left    100 % blockage  . COPD (chronic obstructive pulmonary disease) (HCC)    mild  . Depression   . Diverticulosis   . Esophagitis   . Fatty liver disease, nonalcoholic   . GERD (gastroesophageal reflux disease)   . Herniated lumbar intervertebral disc   . Hyperlipidemia   . IBS (irritable bowel syndrome)   . Osteopenia   . Peptic ulcer disease   . Personal history of tobacco use, presenting hazards to health 03/14/2016  . Stroke ([place])    TIA's X 2  . Vitamin D deficiency     Review of systems:      Physical Exam  General appearance: alert and cooperative Resp: clear to auscultation bilaterally Cardio: S1, S2 normal GI: abnormal findings:  distended and mildly tender in the eptrium and. Umbiliidity or rebound. Bowel souds positive. Extremities: extremities normal, atraumatic, no cyanosis or edema     Planned procedures: proceed with EGD.The patient understands the nature of the planned procedure, indications, risks, alternatives and potential complications including but not limited to bleeding, infection, perforation, damage to internal organs and possible oversedation/side effects from anesthesia. The patient agrees and gives consent to proceed.  Please refer to procedure notes for findings, recommendations and patient  disposition/instructions.    Bray Vickerman K. Molly Matthews, M.D. Gastroenterology 01/26/2017  3:06 PM

## 2017-01-28 ENCOUNTER — Encounter: Payer: Self-pay | Admitting: Internal Medicine

## 2017-01-29 DIAGNOSIS — C801 Malignant (primary) neoplasm, unspecified: Secondary | ICD-10-CM

## 2017-01-29 HISTORY — DX: Malignant (primary) neoplasm, unspecified: C80.1

## 2017-01-30 LAB — SURGICAL PATHOLOGY

## 2017-03-13 DIAGNOSIS — L989 Disorder of the skin and subcutaneous tissue, unspecified: Secondary | ICD-10-CM | POA: Insufficient documentation

## 2017-03-20 DIAGNOSIS — C4492 Squamous cell carcinoma of skin, unspecified: Secondary | ICD-10-CM | POA: Insufficient documentation

## 2017-04-02 ENCOUNTER — Telehealth: Payer: Self-pay | Admitting: *Deleted

## 2017-04-02 NOTE — Telephone Encounter (Signed)
Left message for patient to notify them that it is time to schedule annual low dose lung cancer screening CT scan. Instructed patient to call back to verify information prior to the scan being scheduled.  

## 2017-04-03 ENCOUNTER — Telehealth: Payer: Self-pay | Admitting: *Deleted

## 2017-04-03 DIAGNOSIS — Z122 Encounter for screening for malignant neoplasm of respiratory organs: Secondary | ICD-10-CM

## 2017-04-03 DIAGNOSIS — Z87891 Personal history of nicotine dependence: Secondary | ICD-10-CM

## 2017-04-03 NOTE — Telephone Encounter (Signed)
Notified patient that annual lung cancer screening low dose CT scan is due currently or will be in near future. Confirmed that patient is within the age range of 55-77, and asymptomatic, (no signs or symptoms of lung cancer). Patient denies illness that would prevent curative treatment for lung cancer if found. Verified smoking history, (current, 46 pack year). The shared decision making visit was done 03/14/16. Patient is agreeable for CT scan being scheduled.

## 2017-04-10 ENCOUNTER — Ambulatory Visit
Admission: RE | Admit: 2017-04-10 | Discharge: 2017-04-10 | Disposition: A | Discharge status: Home / Self Care | Payer: Medicare HMO | Source: Ambulatory Visit | Attending: Nurse Practitioner | Admitting: Nurse Practitioner

## 2017-04-10 DIAGNOSIS — I2510 Atherosclerotic heart disease of native coronary artery without angina pectoris: Secondary | ICD-10-CM | POA: Diagnosis not present

## 2017-04-10 DIAGNOSIS — Z87891 Personal history of nicotine dependence: Secondary | ICD-10-CM | POA: Insufficient documentation

## 2017-04-10 DIAGNOSIS — J439 Emphysema, unspecified: Secondary | ICD-10-CM | POA: Insufficient documentation

## 2017-04-10 DIAGNOSIS — I700 Atherosclerosis of aorta: Secondary | ICD-10-CM | POA: Diagnosis not present

## 2017-04-10 DIAGNOSIS — K760 Fatty (change of) liver, not elsewhere classified: Secondary | ICD-10-CM | POA: Diagnosis not present

## 2017-04-10 DIAGNOSIS — Z122 Encounter for screening for malignant neoplasm of respiratory organs: Secondary | ICD-10-CM

## 2017-04-16 ENCOUNTER — Encounter: Payer: Self-pay | Admitting: *Deleted

## 2017-06-15 ENCOUNTER — Other Ambulatory Visit: Payer: Self-pay | Admitting: Family Medicine

## 2017-06-15 ENCOUNTER — Ambulatory Visit
Admission: RE | Admit: 2017-06-15 | Discharge: 2017-06-15 | Disposition: A | Discharge status: Home / Self Care | Payer: Medicare HMO | Source: Ambulatory Visit | Attending: Family Medicine | Admitting: Family Medicine

## 2017-06-15 DIAGNOSIS — R05 Cough: Secondary | ICD-10-CM

## 2017-06-15 DIAGNOSIS — R058 Other specified cough: Secondary | ICD-10-CM

## 2017-06-29 DIAGNOSIS — F3341 Major depressive disorder, recurrent, in partial remission: Secondary | ICD-10-CM | POA: Insufficient documentation

## 2017-08-11 IMAGING — US US ABDOMEN COMPLETE
1 series · 14 of 25 positions shown · non-contrast
Comparison: None.

CLINICAL DATA: Patient with upper abdominal pain. History of fatty
liver disease. Prior cholecystectomy.

EXAM:
ULTRASOUND ABDOMEN COMPLETE

[Series 1: us abdomen complete · 0.28mm/px · 14 of 111 slices shown]
[im 1/111]
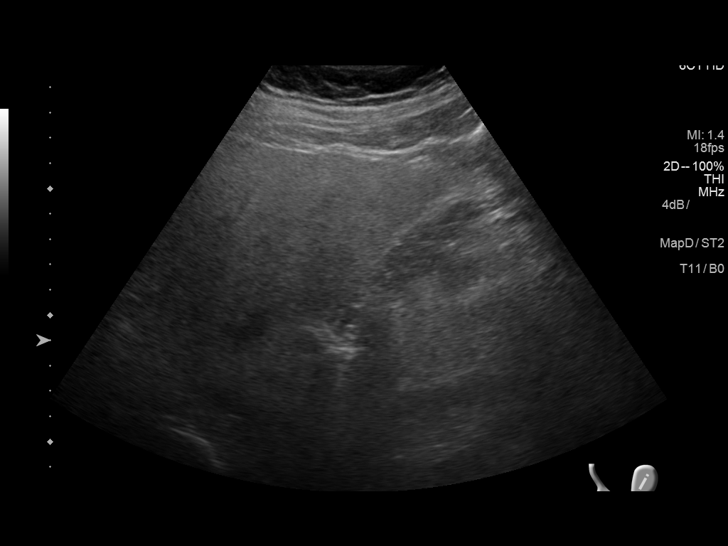
[im 10/111]
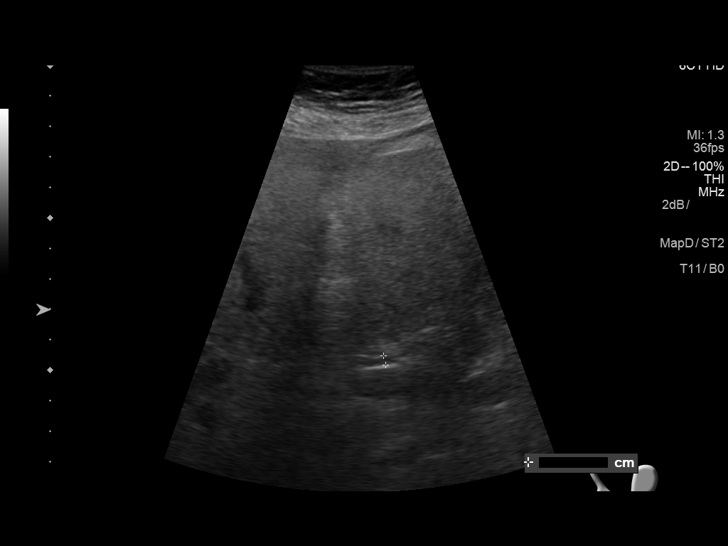
[im 19/111]
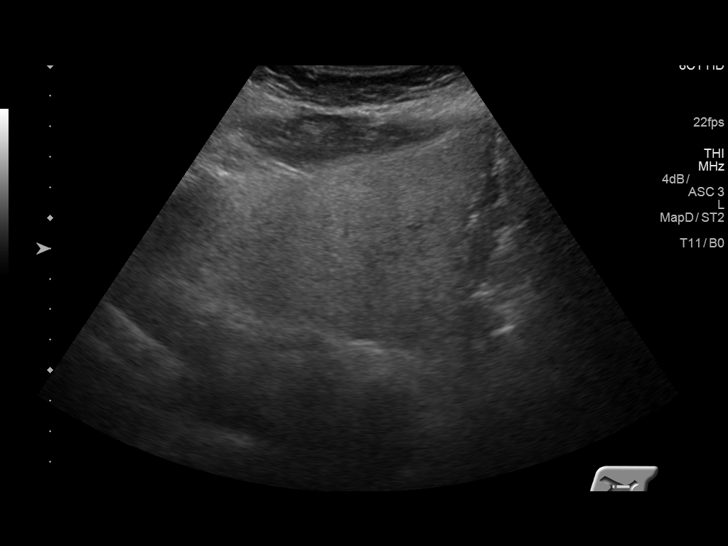
[im 28/111]
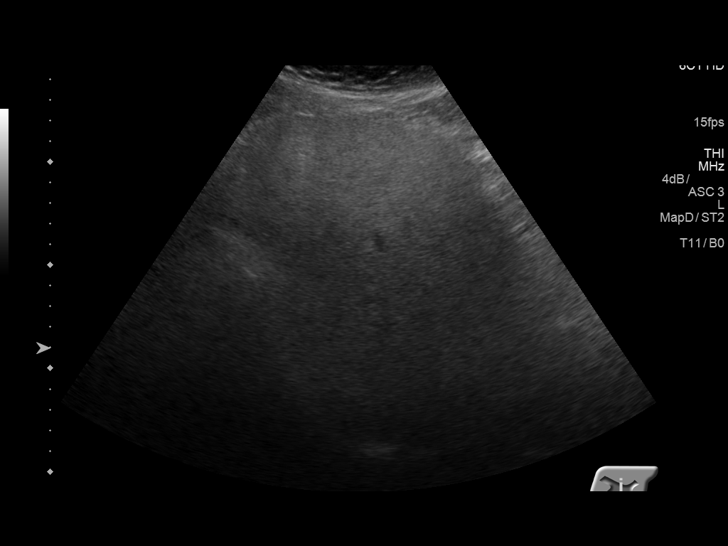
[im 37/111]
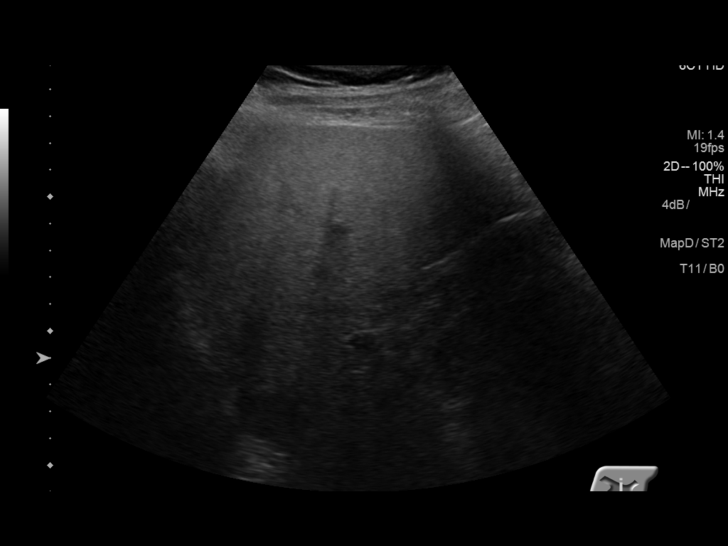
[im 42/111]
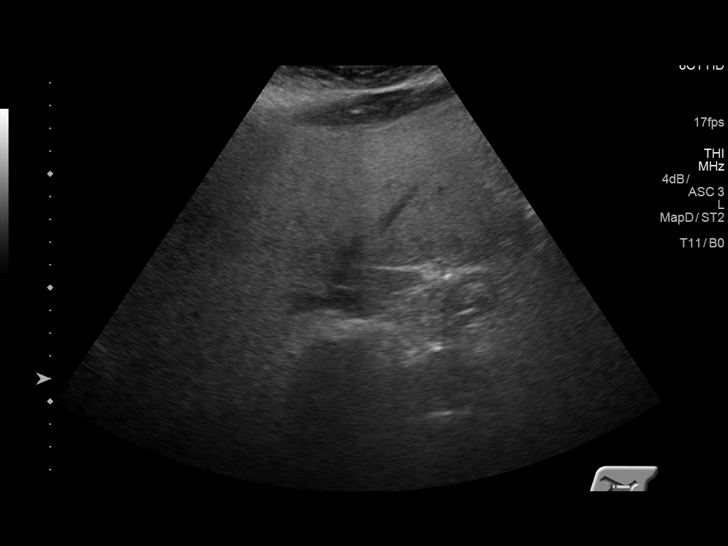
[im 51/111]
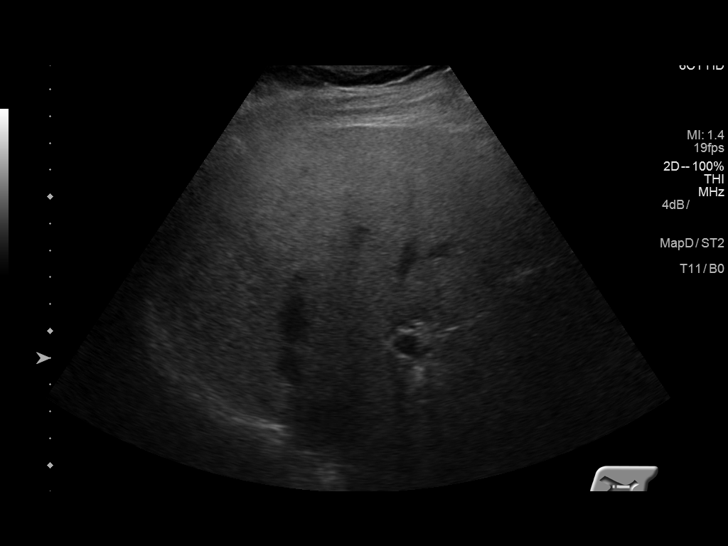
[im 60/111]
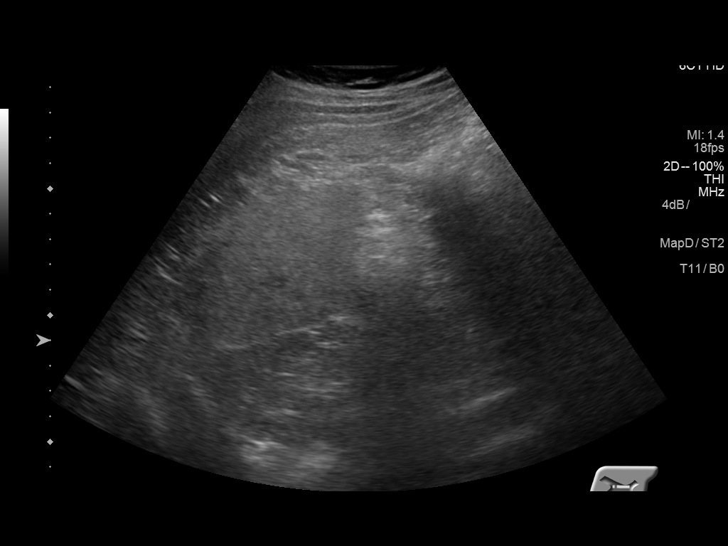
[im 69/111]
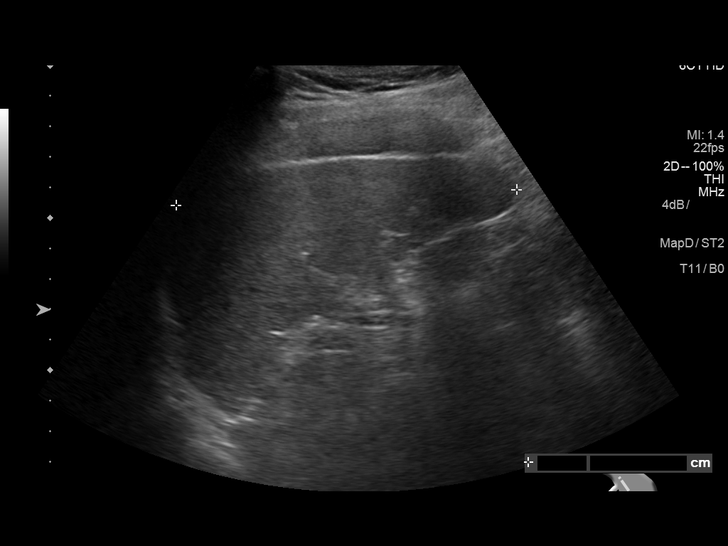
[im 74/111]
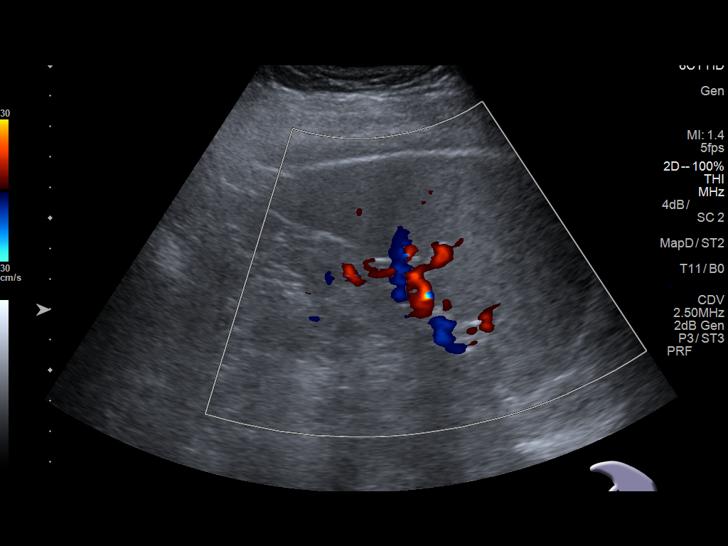
[im 83/111]
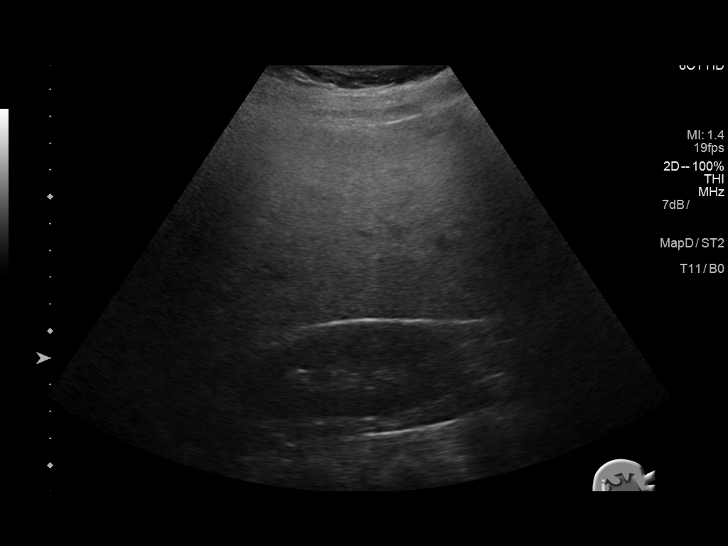
[im 92/111]
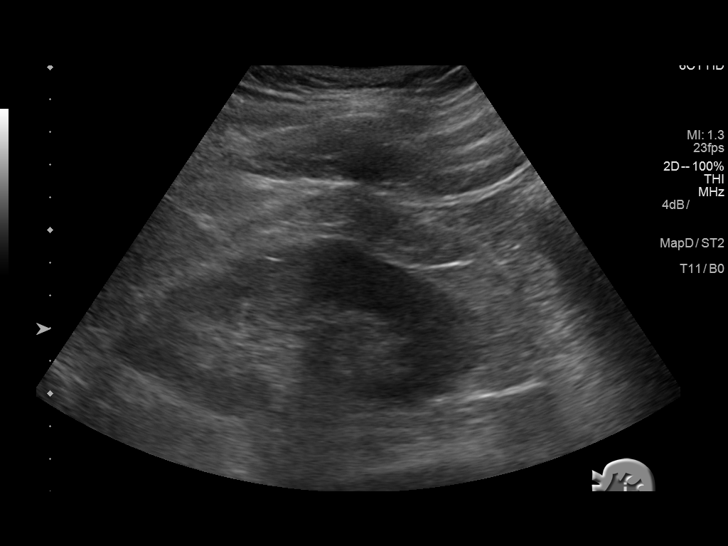
[im 101/111]
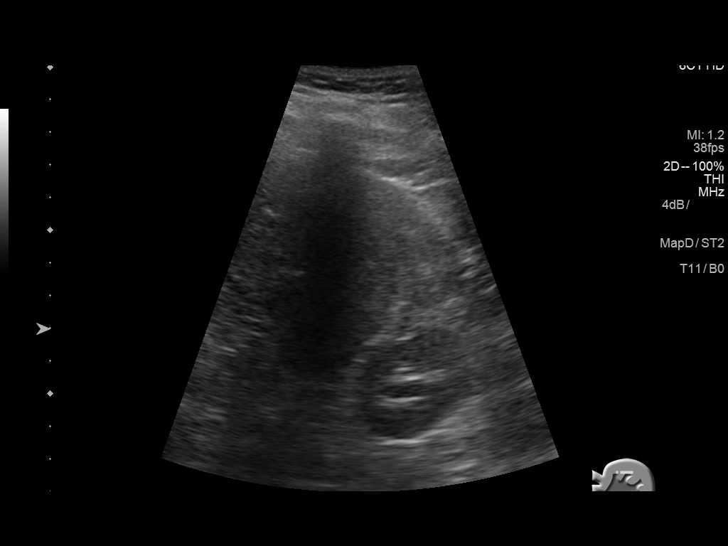
[im 111/111]
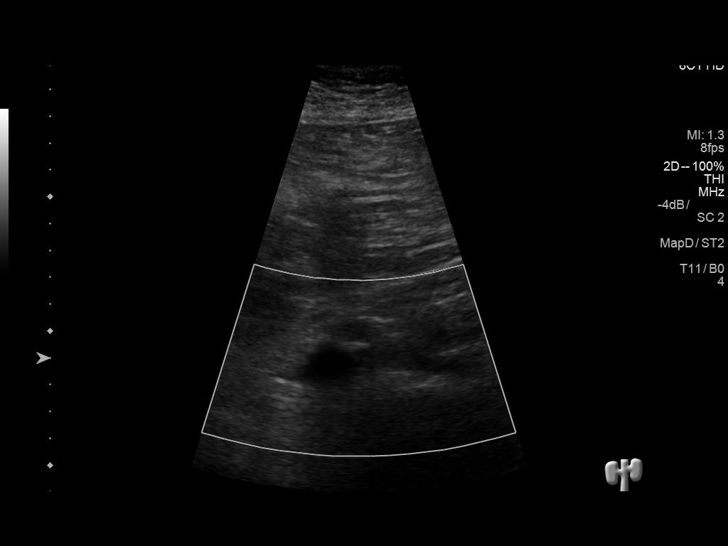

[14 of 25 positions shown; findings below may reference images not displayed]

FINDINGS: Gallbladder: Surgically absent

Common bile duct: Diameter: 3 mm

Liver: Diffusely increased in echogenicity. No focal lesion
identified.

IVC: No abnormality visualized.

Pancreas: Visualized portion unremarkable.

Spleen: Size and appearance within normal limits.

Right Kidney: Length: 10.1 cm. Echogenicity within normal limits. No
mass or hydronephrosis visualized.

Left Kidney: Length: 10.7 cm. Echogenicity within normal limits. No
mass or hydronephrosis visualized.

Abdominal aorta: No aneurysm visualized.

Other findings: None.
IMPRESSION: Hepatic steatosis.

Prior cholecystectomy.  No common bile duct dilatation.

## 2017-08-27 ENCOUNTER — Ambulatory Visit (INDEPENDENT_AMBULATORY_CARE_PROVIDER_SITE_OTHER): Payer: Medicare HMO | Admitting: Vascular Surgery

## 2017-08-27 ENCOUNTER — Encounter (INDEPENDENT_AMBULATORY_CARE_PROVIDER_SITE_OTHER): Payer: Self-pay | Admitting: Vascular Surgery

## 2017-08-27 ENCOUNTER — Ambulatory Visit (INDEPENDENT_AMBULATORY_CARE_PROVIDER_SITE_OTHER): Payer: Medicare HMO

## 2017-08-27 VITALS — BP 155/77 | HR 87 | Resp 16 | Ht 63.0 in | Wt 154.6 lb

## 2017-08-27 DIAGNOSIS — I6523 Occlusion and stenosis of bilateral carotid arteries: Secondary | ICD-10-CM

## 2017-08-27 DIAGNOSIS — E782 Mixed hyperlipidemia: Secondary | ICD-10-CM | POA: Diagnosis not present

## 2017-08-27 DIAGNOSIS — I739 Peripheral vascular disease, unspecified: Secondary | ICD-10-CM | POA: Diagnosis not present

## 2017-08-27 DIAGNOSIS — J449 Chronic obstructive pulmonary disease, unspecified: Secondary | ICD-10-CM

## 2017-08-28 ENCOUNTER — Encounter (INDEPENDENT_AMBULATORY_CARE_PROVIDER_SITE_OTHER): Payer: Self-pay | Admitting: Vascular Surgery

## 2017-08-28 NOTE — Progress Notes (Signed)
MRN : [id number]  Molly Matthews is a 67 y.o. ([birth year]) female who presents with chief complaint of  Chief Complaint  Patient presents with  . Follow-up    21yr abi,carotid  .  History of Present Illness: The patient is seen for follow up evaluation of carotid stenosis. The carotid stenosis followed by ultrasound.   The patient denies amaurosis fugax. There is no recent history of TIA symptoms or focal motor deficits. There is no prior documented CVA.  The patient is taking enteric-coated aspirin 81 mg daily.  There is no history of migraine headaches. There is no history of seizures.  The patient is also here for followup and review of noninvasive studies of the lower extremities. There have been no interval changes in lower extremity symptoms. No interval shortening of the patient's claudication distance or development of rest pain symptoms. No new ulcers or wounds have occurred since the last visit.  There have been no significant changes to the patient's overall health care.  The patient has a history of coronary artery disease, no recent episodes of angina or shortness of breath. There is a history of hyperlipidemia which is being treated with a statin.    Carotid Duplex done today shows right ICA <30% and known occlusion of the left ICA.  No change compared to last study in 08/24/2016  ABI Rt=0.88 and Lt=1.01  (previous ABI's Rt=1.00 and Lt=1.03)   Current Meds  Medication Sig  . albuterol (PROVENTIL HFA;VENTOLIN HFA) 108 (90 Base) MCG/ACT inhaler Inhale 2 puffs into the lungs every 6 (six) hours as needed for wheezing or shortness of breath.  Marland Kitchen aspirin EC 81 MG tablet Take 81 mg by mouth daily.  . citalopram (CELEXA) 40 MG tablet Take 40 mg by mouth at bedtime.   . clopidogrel (PLAVIX) 75 MG tablet Take 75 mg by mouth daily.  . montelukast (SINGULAIR) 10 MG tablet Take 10 mg by mouth at bedtime.  . pantoprazole (PROTONIX) 40 MG tablet Take 40 mg by mouth every morning.    . rosuvastatin (CRESTOR) 5 MG tablet Take 2.5 mg by mouth at bedtime.     Past Medical History:  Diagnosis Date  . Anxiety   . Arthritis   . Carotid artery stenosis   . Carotid stenosis, left    100 % blockage  . COPD (chronic obstructive pulmonary disease) (HCC)    mild  . Depression   . Diverticulosis   . Esophagitis   . Fatty liver disease, nonalcoholic   . GERD (gastroesophageal reflux disease)   . Herniated lumbar intervertebral disc   . Hyperlipidemia   . IBS (irritable bowel syndrome)   . Osteopenia   . Peptic ulcer disease   . Personal history of tobacco use, presenting hazards to health 03/14/2016  . Stroke ([place])    TIA's X 2  . Vitamin D deficiency     Past Surgical History:  Procedure Laterality Date  . ABDOMINAL HYSTERECTOMY    . BREAST BIOPSY Left    negative 2008  . CHOLECYSTECTOMY    . DILATION AND CURETTAGE OF UTERUS    . ESOPHAGOGASTRODUODENOSCOPY (EGD) WITH PROPOFOL N/A 01/26/2017   Procedure: ESOPHAGOGASTRODUODENOSCOPY (EGD) WITH PROPOFOL;  Surgeon: Toledo, Benay Pike, MD;  Location: [facility];  Service: Gastroenterology;  Laterality: N/A;  . HEMORRHOID SURGERY N/A 07/25/2016   Procedure: HEMORRHOIDECTOMY INTERNAL AND EXTERNAL;  Surgeon: Leonie Green, MD;  Location: [facility] ORS;  Service: General;  Laterality: N/A;  . VASCULAR SURGERY Right  Carotid Endarterectomy     Social History Social History   Tobacco Use  . Smoking status: Current Every Day Smoker    Packs/day: 1.00    Years: 45.00    Pack years: 45.00    Types: Cigarettes  . Smokeless tobacco: Never Used  Substance Use Topics  . Alcohol use: No  . Drug use: No    Family History Family History  Problem Relation Age of Onset  . Breast cancer Mother 26  . Atrial fibrillation Mother   . Breast cancer Sister 95  . Heart attack Father     Allergies  Allergen Reactions  . Statins Other (See Comments)    Muscle and joint aches - can take in small doses  . Zithromax  [Azithromycin] Other (See Comments)    Back Pain  . Sulfur Rash     REVIEW OF SYSTEMS (Negative unless checked)  Constitutional: [] Weight loss  [] Fever  [] Chills Cardiac: [] Chest pain   [] Chest pressure   [] Palpitations   [] Shortness of breath when laying flat   [] Shortness of breath with exertion. Vascular:  [x] Pain in legs with walking   [] Pain in legs at rest  [] History of DVT   [] Phlebitis   [] Swelling in legs   [] Varicose veins   [] Non-healing ulcers Pulmonary:   [] Uses home oxygen   [] Productive cough   [] Hemoptysis   [] Wheeze  [] COPD   [] Asthma Neurologic:  [] Dizziness   [] Seizures   [] History of stroke   [] History of TIA  [] Aphasia   [] Vissual changes   [] Weakness or numbness in arm   [] Weakness or numbness in leg Musculoskeletal:   [] Joint swelling   [x] Joint pain   [] Low back pain Hematologic:  [] Easy bruising  [] Easy bleeding   [] Hypercoagulable state   [] Anemic Gastrointestinal:  [] Diarrhea   [] Vomiting  [] Gastroesophageal reflux/heartburn   [] Difficulty swallowing. Genitourinary:  [] Chronic kidney disease   [] Difficult urination  [] Frequent urination   [] Blood in urine Skin:  [] Rashes   [] Ulcers  Psychological:  [] History of anxiety   []  History of major depression.  Physical Examination  Vitals:   08/27/17 1103  BP: (!) 155/77  Pulse: 87  Resp: 16  Weight: 154 lb 9.6 oz (70.1 kg)  Height: 5\' 3"  (1.6 m)   Body mass index is 27.39 kg/m. Gen: WD/WN, NAD Head: [place]/AT, No temporalis wasting.  Ear/Nose/Throat: Hearing grossly intact, nares w/o erythema or drainage Eyes: PER, EOMI, sclera nonicteric.  Neck: Supple, no large masses.   Pulmonary:  Good air movement, no audible wheezing bilaterally, no use of accessory muscles.  Cardiac: RRR, no JVD Vascular:  Bilateral carotid bruits Vessel Right Left  Radial Palpable Palpable  Brachial Palpable Palpable  Carotid Palpable Palpable  PT Not Palpable Trace Palpable  DP Trace Palpable Not Palpable  Gastrointestinal:  Non-distended. No guarding/no peritoneal signs.  Musculoskeletal: M/S 5/5 throughout.  No deformity or atrophy.  Neurologic: CN 2-12 intact. Symmetrical.  Speech is fluent. Motor exam as listed above. Psychiatric: Judgment intact, Mood & affect appropriate for pt's clinical situation. Dermatologic: No rashes or ulcers noted.  No changes consistent with cellulitis. Lymph : No lichenification or skin changes of chronic lymphedema.  CBC Lab Results  Component Value Date   WBC 12.1 (H) 07/19/2016   HGB 16.0 07/19/2016   HCT 46.7 07/19/2016   MCV 94.7 07/19/2016   PLT 260 07/19/2016    BMET    Component Value Date/Time   NA 141 07/19/2016 1143   NA 142 01/29/2014 2124  K 4.1 07/19/2016 1143   K 4.0 01/29/2014 2124   CL 104 07/19/2016 1143   CL 110 (H) 01/29/2014 2124   CO2 29 07/19/2016 1143   CO2 25 01/29/2014 2124   GLUCOSE 105 (H) 07/19/2016 1143   GLUCOSE 137 (H) 01/29/2014 2124   BUN 13 07/19/2016 1143   BUN 10 01/29/2014 2124   CREATININE 0.74 07/19/2016 1143   CREATININE 1.00 01/29/2014 2124   CALCIUM 10.0 07/19/2016 1143   CALCIUM 9.3 01/29/2014 2124   GFRNONAA >60 07/19/2016 1143   GFRNONAA 59 (L) 01/29/2014 2124   GFRNONAA >60 12/22/2013 1159   GFRAA >60 07/19/2016 1143   GFRAA >60 01/29/2014 2124   GFRAA >60 12/22/2013 1159   CrCl cannot be calculated (Patient's most recent lab result is older than the maximum 21 days allowed.).  COAG No results found for: INR, PROTIME  Radiology No results found.   Assessment/Plan 1. Bilateral carotid artery stenosis Recommend:  Given the patient's asymptomatic subcritical stenosis no further invasive testing or surgery at this time.  Duplex ultrasound shows right ICA <30% and known occlusion of the left ICA.   Continue antiplatelet therapy as prescribed Continue management of CAD, HTN and Hyperlipidemia Healthy heart diet,  encouraged exercise at least 4 times per week Follow up in 12 months with duplex  ultrasound and physical exam based on <50% stenosis of the right carotid artery   - VAS US CAROTID; Future  2. PAD (peripheral artery disease) (HCC)  Recommend:  The patient has evidence of atherosclerosis of the lower extremities with claudication.  The patient does not voice lifestyle limiting changes at this point in time.  Noninvasive studies do not suggest clinically significant change.  No invasive studies, angiography or surgery at this time The patient should continue walking and begin a more formal exercise program.  The patient should continue antiplatelet therapy and aggressive treatment of the lipid abnormalities  No changes in the patient's medications at this time  The patient should continue wearing graduated compression socks 10-15 mmHg strength to control the mild edema.   - VAS [place] ABI WITH/WO TBI; Future  3. COPD mixed type ([place]) Continue pulmonary medications and aerosols as already ordered, these medications have been reviewed and there are no changes at this time.    4. Mixed hyperlipidemia Continue statin as ordered and reviewed, no changes at this time     Hortencia Pilar, MD  08/28/2017 9:34 AM

## 2017-09-23 IMAGING — RF DG ESOPHAGUS
1 series · 11 of 11 positions shown · non-contrast
Comparison: None in PACs

CLINICAL DATA: Several month history of dysphagia to solids and
liquids, history of reflux, hiatal hernia.

EXAM:
ESOPHOGRAM / BARIUM SWALLOW / BARIUM TABLET STUDY
TECHNIQUE: Combined double contrast and single contrast examination performed
using effervescent crystals, thick barium liquid, and thin barium
liquid. The patient was observed with fluoroscopy swallowing a 13 mm
barium sulphate tablet.
FLUOROSCOPY TIME:  Fluoroscopy Time:  1 minutes, 6 seconds
Number of Acquired Images:  11

[Series 1: fluoro_barium 2fps_bw · 0.18mm/px · 11 of 11 slices shown]
[im 1/11]
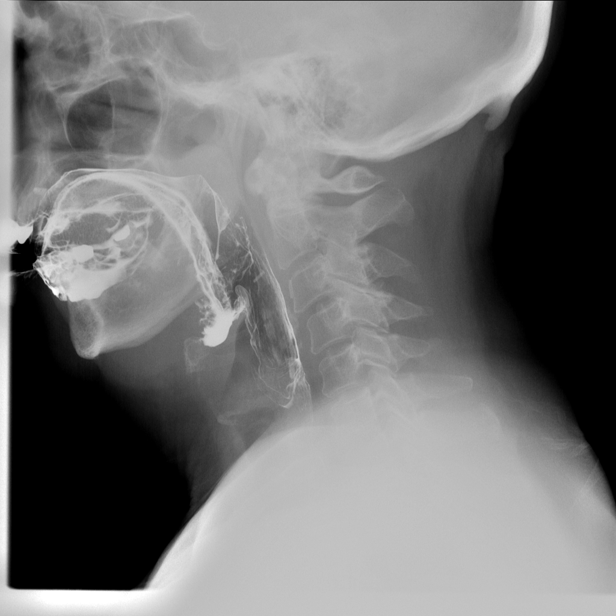
[im 2/11]
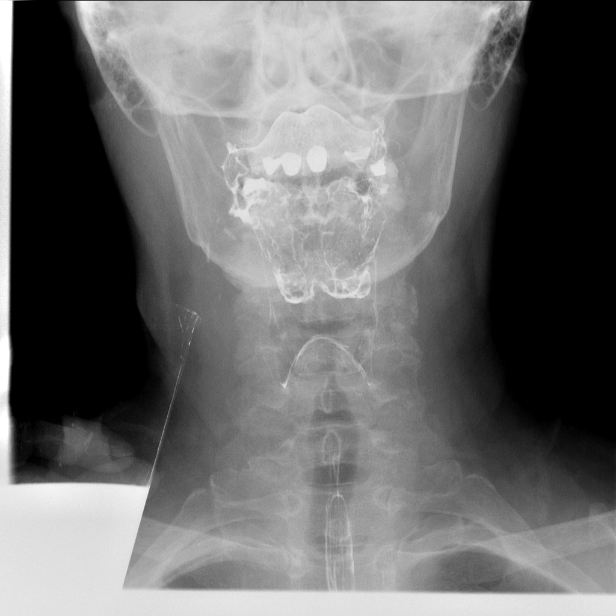
[im 3/11]
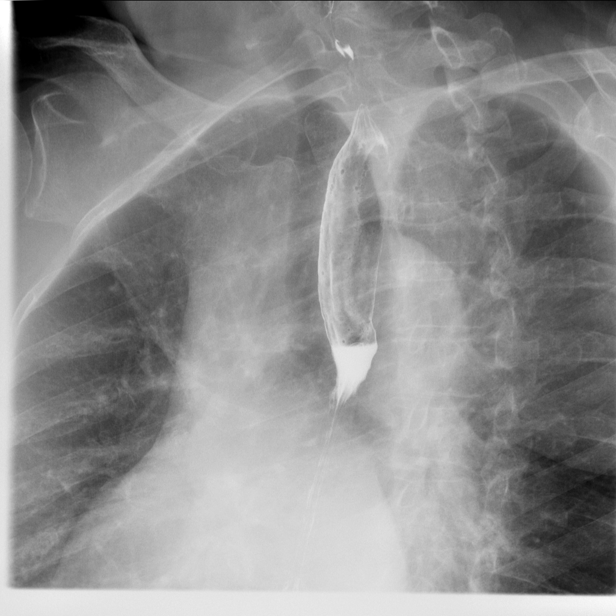
[im 4/11]
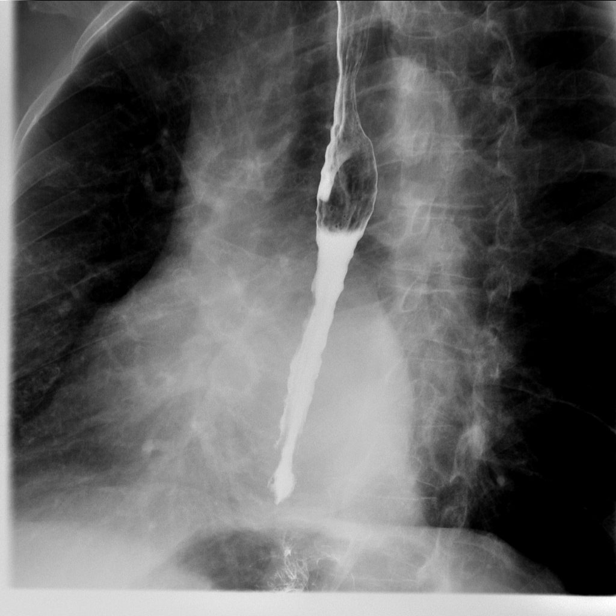
[im 5/11]
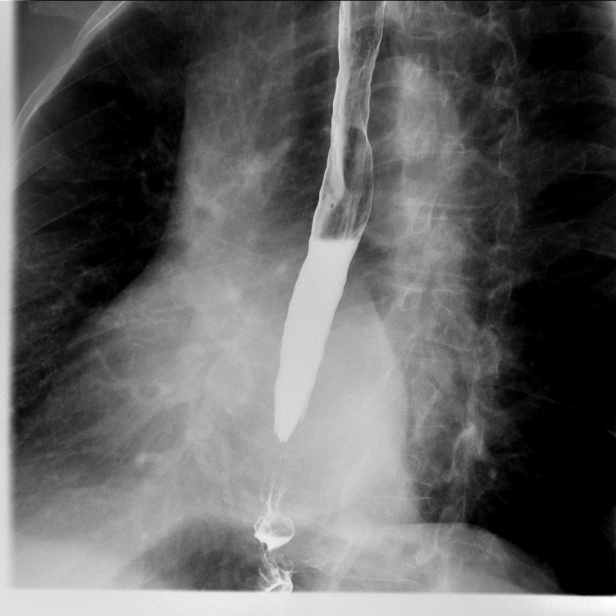
[im 6/11]
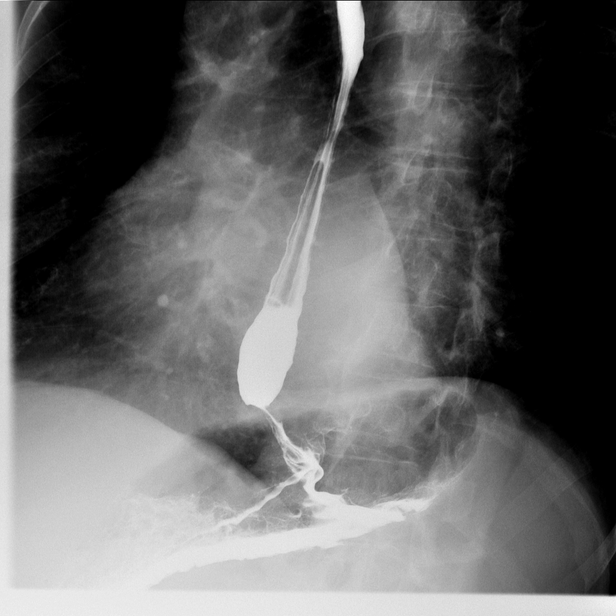
[im 7/11]
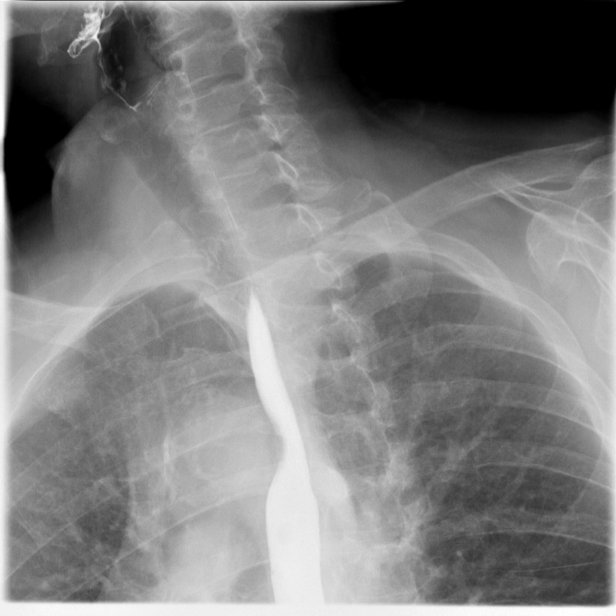
[im 8/11]
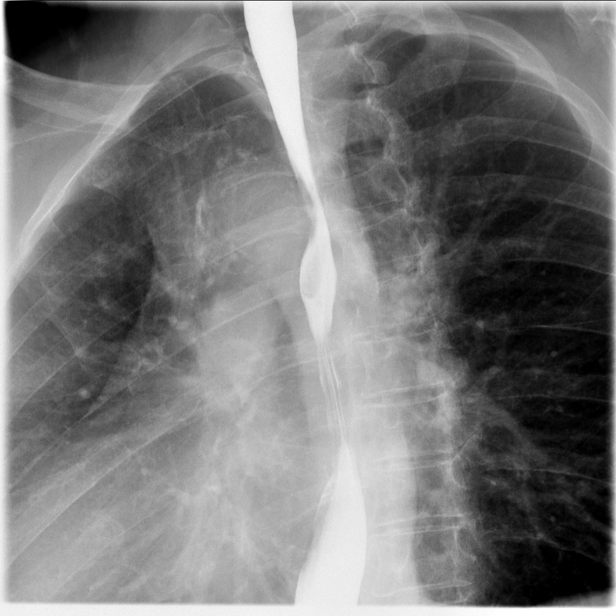
[im 9/11]
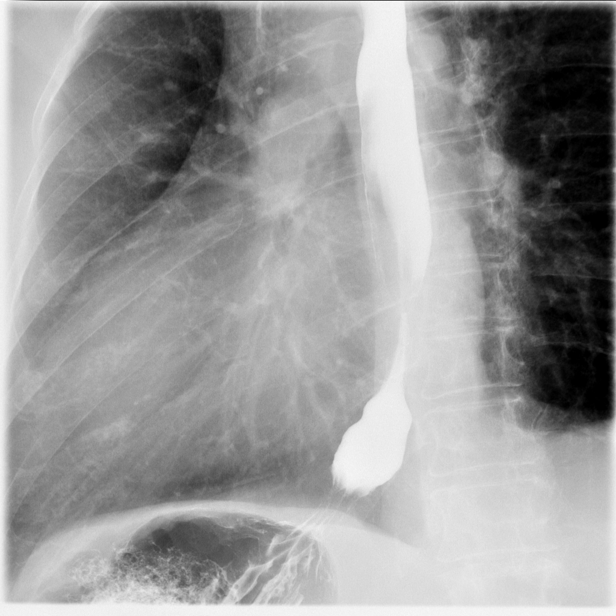
[im 10/11]
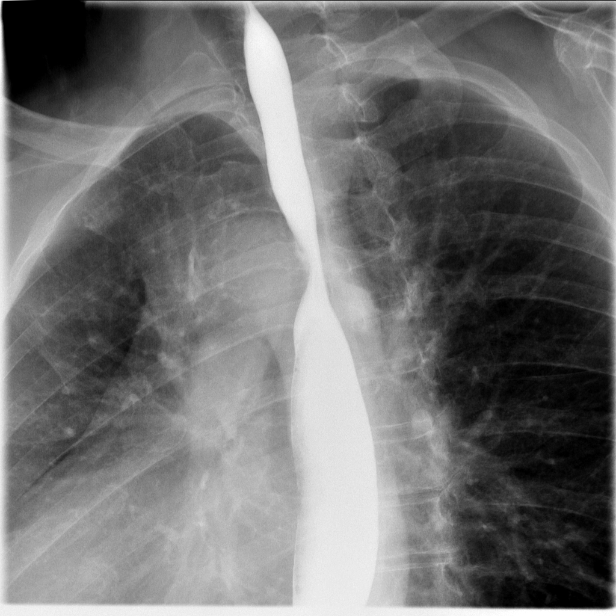
[im 11/11]
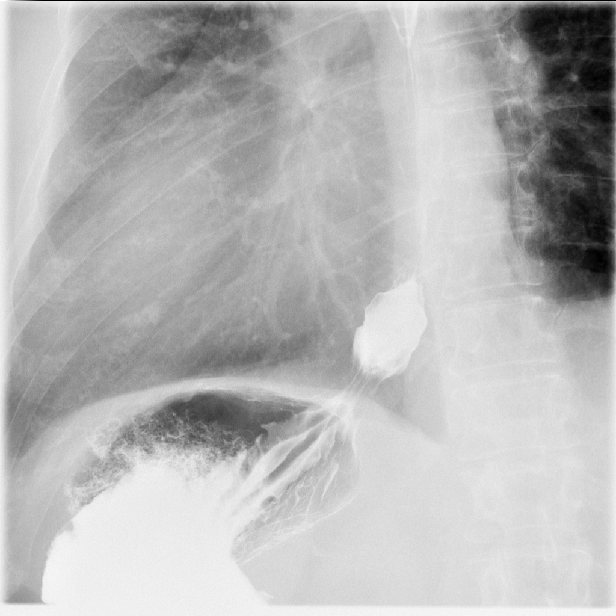

[11 of 11 positions shown; findings below may reference images not displayed]

FINDINGS: The patient ingested the thick and thin barium and the gas-forming
crystals without difficulty. The cervical esophagus distended well.
There was no laryngeal penetration of the barium. The thoracic
esophagus distended well. Tertiary contractions were frequently
observed. There was no evidence of a fixed stricture. There was a
small reducible hiatal hernia. No gastroesophageal reflux was
observed. The barium tablet passed without difficulty.
IMPRESSION: Small reducible hiatal hernia. Changes of presbyesophagus diffusely.
There is no evidence of a stricture nor of esophagitis.

## 2017-10-07 ENCOUNTER — Ambulatory Visit
Admission: EM | Admit: 2017-10-07 | Discharge: 2017-10-07 | Disposition: A | Discharge status: Home / Self Care | Payer: Medicare HMO | Attending: Family Medicine | Admitting: Family Medicine

## 2017-10-07 ENCOUNTER — Ambulatory Visit (INDEPENDENT_AMBULATORY_CARE_PROVIDER_SITE_OTHER): Payer: Medicare HMO

## 2017-10-07 ENCOUNTER — Other Ambulatory Visit: Payer: Self-pay

## 2017-10-07 DIAGNOSIS — R0789 Other chest pain: Secondary | ICD-10-CM | POA: Diagnosis not present

## 2017-10-07 DIAGNOSIS — M546 Pain in thoracic spine: Secondary | ICD-10-CM | POA: Diagnosis not present

## 2017-10-07 DIAGNOSIS — W06XXXA Fall from bed, initial encounter: Secondary | ICD-10-CM

## 2017-10-07 DIAGNOSIS — R0781 Pleurodynia: Secondary | ICD-10-CM

## 2017-10-07 DIAGNOSIS — M791 Myalgia, unspecified site: Secondary | ICD-10-CM

## 2017-10-07 DIAGNOSIS — W19XXXA Unspecified fall, initial encounter: Secondary | ICD-10-CM

## 2017-10-07 MED ORDER — TRAMADOL HCL 50 MG PO TABS: 50.0000 mg | ORAL_TABLET | Freq: Three times a day (TID) | ORAL | 0 refills | Status: DC | PRN

## 2017-10-07 NOTE — ED Triage Notes (Signed)
Pt rolled out of bed Thursday night and landed on her right side. Pain with coughing, sneezing. Pain around right breast, upper right back, and right arm.

## 2017-10-07 NOTE — ED Provider Notes (Signed)
[facility]    CSN: [id number] Arrival date & time: 10/07/17  1150   History   Chief Complaint Chief Complaint  Patient presents with  . Fall   HPI  67 year old female presents following a fall.  Patient states that she accidentally fell out of bed on Thursday.  Since that time she is had pain of her upper back as well as the lateral ribs.  She states that her shoulder also hurts but she is able to move it without difficulty.  No medications tried.  She has used heat without resolution.  Worse with coughing and sneezing.  Severe.  No known relieving factors.  No other associated symptoms.  No other complaints.  Past Medical History:  Diagnosis Date  . Anxiety   . Arthritis   . Carotid artery stenosis   . Carotid stenosis, left    100 % blockage  . COPD (chronic obstructive pulmonary disease) (HCC)    mild  . Depression   . Diverticulosis   . Esophagitis   . Fatty liver disease, nonalcoholic   . GERD (gastroesophageal reflux disease)   . Herniated lumbar intervertebral disc   . Hyperlipidemia   . IBS (irritable bowel syndrome)   . Osteopenia   . Peptic ulcer disease   . Personal history of tobacco use, presenting hazards to health 03/14/2016  . Stroke ([place])    TIA's X 2  . Vitamin D deficiency     Patient Active Problem List   Diagnosis Date Noted  . Carotid stenosis 08/24/2016  . PAD (peripheral artery disease) ([place]) 08/24/2016  . COPD mixed type ([place]) 08/24/2016  . Hyperlipidemia 08/24/2016  . Personal history of tobacco use, presenting hazards to health 03/14/2016    Past Surgical History:  Procedure Laterality Date  . ABDOMINAL HYSTERECTOMY    . BREAST BIOPSY Left    negative 2008  . CHOLECYSTECTOMY    . DILATION AND CURETTAGE OF UTERUS    . ESOPHAGOGASTRODUODENOSCOPY (EGD) WITH PROPOFOL N/A 01/26/2017   Procedure: ESOPHAGOGASTRODUODENOSCOPY (EGD) WITH PROPOFOL;  Surgeon: Toledo, Benay Pike, MD;  Location: [facility];  Service:  Gastroenterology;  Laterality: N/A;  . HEMORRHOID SURGERY N/A 07/25/2016   Procedure: HEMORRHOIDECTOMY INTERNAL AND EXTERNAL;  Surgeon: Leonie Green, MD;  Location: [facility] ORS;  Service: General;  Laterality: N/A;  . VASCULAR SURGERY Right    Carotid Endarterectomy     OB History   None      Home Medications    Prior to Admission medications   Medication Sig Start Date End Date Taking? Authorizing Provider  cetirizine (ZYRTEC) 10 MG tablet Take 10 mg by mouth daily.   Yes [provider]  albuterol (PROVENTIL HFA;VENTOLIN HFA) 108 (90 Base) MCG/ACT inhaler Inhale 2 puffs into the lungs every 6 (six) hours as needed for wheezing or shortness of breath.    [provider]  aspirin EC 81 MG tablet Take 81 mg by mouth daily.    [provider]  citalopram (CELEXA) 40 MG tablet Take 40 mg by mouth at bedtime.     [provider]  clopidogrel (PLAVIX) 75 MG tablet Take 75 mg by mouth daily.    [provider]  pantoprazole (PROTONIX) 40 MG tablet Take 40 mg by mouth every morning.     [provider]  rosuvastatin (CRESTOR) 5 MG tablet Take 2.5 mg by mouth at bedtime.     [provider]  traMADol (ULTRAM) 50 MG tablet Take 1 tablet (50 mg total)  by mouth every 8 (eight) hours as needed. 10/07/17   Undray Allman, Barnie Del, DO  umeclidinium-vilanterol (ANORO ELLIPTA) 62.5-25 MCG/INH AEPB Inhale 1 puff into the lungs daily as needed (Short of breath).  12/28/15   [provider]    Family History Family History  Problem Relation Age of Onset  . Breast cancer Mother 74  . Atrial fibrillation Mother   . Breast cancer Sister 34  . Heart attack Father     Social History Social History   Tobacco Use  . Smoking status: Current Every Day Smoker    Packs/day: 1.00    Years: 45.00    Pack years: 45.00    Types: Cigarettes  . Smokeless tobacco: Never Used  Substance Use Topics  . Alcohol use: No  . Drug use: No      Allergies   Statins; Zithromax [azithromycin]; and Sulfur  Review of Systems Review of Systems  Constitutional: Negative.   Musculoskeletal:       Back pain and rib pain.   Physical Exam Triage Vital Signs ED Triage Vitals  Enc Vitals Group     BP 10/07/17 1206 (!) 173/73     Pulse Rate 10/07/17 1206 80     Resp 10/07/17 1206 20     Temp 10/07/17 1206 97.9 F (36.6 C)     Temp Source 10/07/17 1206 Oral     SpO2 10/07/17 1206 97 %     Weight 10/07/17 1207 156 lb (70.8 kg)     Height 10/07/17 1207 5\' 3"  (1.6 m)     Head Circumference --      Peak Flow --      Pain Score 10/07/17 1207 10     Pain Loc --      Pain Edu? --      Excl. in [place]? --    Updated Vital Signs BP (!) 173/73 (BP Location: Left Arm)   Pulse 80   Temp 97.9 F (36.6 C) (Oral)   Resp 20   Ht 5\' 3"  (1.6 m)   Wt 156 lb (70.8 kg)   SpO2 97%   BMI 27.63 kg/m   Physical Exam  Constitutional: She is oriented to person, place, and time. She appears well-developed. No distress.  Cardiovascular: Normal rate and regular rhythm.  Pulmonary/Chest: Effort normal and breath sounds normal. She has no wheezes. She has no rales.  Musculoskeletal:  Patient with exquisite tenderness of the paraspinal musculature on the right (thoracic spine).  Right mid and lower lateral rib tenderness.  Neurological: She is alert and oriented to person, place, and time.  Psychiatric: She has a normal mood and affect. Her behavior is normal.  Nursing note and vitals reviewed.  UC Treatments / Results  Labs (all labs ordered are listed, but only abnormal results are displayed) Labs Reviewed - No data to display  EKG None  Radiology Dg Ribs Unilateral W/chest Right  Result Date: 10/07/2017 CLINICAL DATA:  Fall. Right-sided chest and rib pain. Initial encounter. EXAM: RIGHT RIBS AND CHEST - 3+ VIEW COMPARISON:  Chest radiograph on 06/15/2017 FINDINGS: No acute right rib fractures identified. Old fracture deformity of the  right posterior 8th rib is seen. There is no evidence of pneumothorax or pleural effusion. Both lungs are clear. Heart size and mediastinal contours are within normal limits. Aortic atherosclerosis. Pulmonary hyperinflation again seen, consistent with COPD. IMPRESSION: No acute right-sided rib fractures. COPD.  No active cardiopulmonary disease. Electronically Signed   By: Sharrie Rothman.D.  On: 10/07/2017 13:40   Dg Thoracic Spine 2 View  Result Date: 10/07/2017 CLINICAL DATA:  Fall. Right-sided thoracic back pain. Initial encounter. EXAM: THORACIC SPINE 2 VIEWS COMPARISON:  CT on 04/10/2017 FINDINGS: There is no evidence of acute thoracic spine fracture. Alignment is normal. Mild superior end plate compression deformity of the L1 vertebral body is unchanged. No focal lytic or sclerotic bone lesions identified. IMPRESSION: No acute findings. Electronically Signed   By: Earle Gell M.D.   On: 10/07/2017 13:42    Procedures Procedures (including critical care time)  Medications Ordered in UC Medications - No data to display  Initial Impression / Assessment and Plan / UC Course  I have reviewed the triage vital signs and the nursing notes.  Pertinent labs & imaging results that were available during my care of the patient were reviewed by me and considered in my medical decision making (see chart for details).    67 year old female presents with musculoskeletal pain after suffering a fall.  X-rays negative.  Treating with tramadol.  Advised rest and heat.  Final Clinical Impressions(s) / UC Diagnoses   Final diagnoses:  Fall, initial encounter     Discharge Instructions     Rest.  Pain medication as directed.  Take care  Dr. Lacinda Axon    ED Prescriptions    Medication Sig Dispense Auth. Provider   traMADol (ULTRAM) 50 MG tablet Take 1 tablet (50 mg total) by mouth every 8 (eight) hours as needed. 15 tablet Coral Spikes, DO     Controlled Substance Prescriptions  Controlled  Substance Registry consulted? Not Applicable   Coral Spikes, DO 10/07/17 1421

## 2017-10-07 NOTE — Discharge Instructions (Signed)
Rest.  Pain medication as directed.  Take care  Dr. Lacinda Axon

## 2017-12-03 ENCOUNTER — Other Ambulatory Visit: Payer: Self-pay | Admitting: Family Medicine

## 2017-12-03 DIAGNOSIS — Z1231 Encounter for screening mammogram for malignant neoplasm of breast: Secondary | ICD-10-CM

## 2018-01-03 ENCOUNTER — Ambulatory Visit
Admission: RE | Admit: 2018-01-03 | Discharge: 2018-01-03 | Disposition: A | Discharge status: Home / Self Care | Payer: Medicare HMO | Source: Ambulatory Visit | Attending: Family Medicine | Admitting: Family Medicine

## 2018-01-03 DIAGNOSIS — Z1231 Encounter for screening mammogram for malignant neoplasm of breast: Secondary | ICD-10-CM | POA: Diagnosis present

## 2018-02-15 ENCOUNTER — Encounter: Payer: Self-pay | Admitting: Obstetrics and Gynecology

## 2018-02-15 ENCOUNTER — Ambulatory Visit: Payer: Medicare HMO | Admitting: Obstetrics and Gynecology

## 2018-02-15 VITALS — BP 100/54 | HR 95 | Ht 63.0 in | Wt 143.0 lb

## 2018-02-15 DIAGNOSIS — N898 Other specified noninflammatory disorders of vagina: Secondary | ICD-10-CM

## 2018-02-15 DIAGNOSIS — N760 Acute vaginitis: Secondary | ICD-10-CM

## 2018-02-15 DIAGNOSIS — B9689 Other specified bacterial agents as the cause of diseases classified elsewhere: Secondary | ICD-10-CM

## 2018-02-15 LAB — POCT WET PREP WITH KOH
CLUE CELLS WET PREP PER HPF POC: POSITIVE
KOH Prep POC: POSITIVE — AB
Trichomonas, UA: NEGATIVE
YEAST WET PREP PER HPF POC: NEGATIVE

## 2018-02-15 MED ORDER — METRONIDAZOLE 0.75 % VA GEL: 1.0000 | Freq: Every day | VAGINAL | 0 refills | Status: AC

## 2018-02-15 NOTE — Progress Notes (Signed)
[name], MD   Chief Complaint  Patient presents with  . Vaginitis    itchiness and odor x a few months, and since tuesday itchiness got really bad    HPI:      Molly Matthews is a 67 y.o. No obstetric history on file. who LMP was No LMP recorded. Patient has had a hysterectomy., presents today for vaginal itching/irritation and non-fishy odor, no increased d/c. Sx for a couple months. No prior abx use. Scratching vag area regularly. No pelvic pain. Uses caresse body wash and dryer sheets. No recent abx use. Is sex active with dryness, using baby oil as lubricant.  S/p TAH for leio. No PMB.   Past Medical History:  Diagnosis Date  . Anxiety   . Arthritis   . Cancer ([place]) 01/2017   Skin cancer on right leg  . Carotid artery stenosis   . Carotid stenosis, left    100 % blockage  . COPD (chronic obstructive pulmonary disease) (HCC)    mild  . Depression   . Diverticulosis   . Esophagitis   . Fatty liver disease, nonalcoholic   . GERD (gastroesophageal reflux disease)   . Herniated lumbar intervertebral disc   . Hyperlipidemia   . IBS (irritable bowel syndrome)   . Osteopenia   . Peptic ulcer disease   . Personal history of tobacco use, presenting hazards to health 03/14/2016  . Stroke ([place])    TIA's X 2  . Vitamin D deficiency     Past Surgical History:  Procedure Laterality Date  . ABDOMINAL HYSTERECTOMY    . BREAST BIOPSY Left    negative 2008  . CHOLECYSTECTOMY    . DILATION AND CURETTAGE OF UTERUS    . ESOPHAGOGASTRODUODENOSCOPY (EGD) WITH PROPOFOL N/A 01/26/2017   Procedure: ESOPHAGOGASTRODUODENOSCOPY (EGD) WITH PROPOFOL;  Surgeon: Toledo, Benay Pike, MD;  Location: [facility] ENDOSCOPY;  Service: Gastroenterology;  Laterality: N/A;  . HEMORRHOID SURGERY N/A 07/25/2016   Procedure: HEMORRHOIDECTOMY INTERNAL AND EXTERNAL;  Surgeon: Leonie Green, MD;  Location: [facility] ORS;  Service: General;  Laterality: N/A;  . VASCULAR SURGERY Right    Carotid  Endarterectomy     Family History  Problem Relation Age of Onset  . Breast cancer Mother 24  . Atrial fibrillation Mother   . Breast cancer Sister 70  . Heart attack Father     Social History   Socioeconomic History  . Marital status: Married    Spouse name: Not on file  . Number of children: Not on file  . Years of education: Not on file  . Highest education level: Not on file  Occupational History  . Not on file  Social Needs  . Financial resource strain: Not on file  . Food insecurity:    Worry: Not on file    Inability: Not on file  . Transportation needs:    Medical: Not on file    Non-medical: Not on file  Tobacco Use  . Smoking status: Current Every Day Smoker    Packs/day: 1.00    Years: 45.00    Pack years: 45.00    Types: Cigarettes  . Smokeless tobacco: Never Used  Substance and Sexual Activity  . Alcohol use: No  . Drug use: No  . Sexual activity: Yes    Birth control/protection: Surgical    Comment: Hysterectomy  Lifestyle  . Physical activity:    Days per week: Not on file    Minutes per session: Not  on file  . Stress: Not on file  Relationships  . Social connections:    Talks on phone: Not on file    Gets together: Not on file    Attends religious service: Not on file    Active member of club or organization: Not on file    Attends meetings of clubs or organizations: Not on file    Relationship status: Not on file  . Intimate partner violence:    Fear of current or ex partner: Not on file    Emotionally abused: Not on file    Physically abused: Not on file    Forced sexual activity: Not on file  Other Topics Concern  . Not on file  Social History Narrative  . Not on file    Outpatient Medications Prior to Visit  Medication Sig Dispense Refill  . albuterol (PROVENTIL HFA;VENTOLIN HFA) 108 (90 Base) MCG/ACT inhaler Inhale 2 puffs into the lungs every 6 (six) hours as needed for wheezing or shortness of breath.    . busPIRone (BUSPAR)  7.5 MG tablet Take by mouth.    . cetirizine (ZYRTEC) 10 MG tablet Take 10 mg by mouth daily.    . citalopram (CELEXA) 40 MG tablet Take 40 mg by mouth at bedtime.     . clopidogrel (PLAVIX) 75 MG tablet Take 75 mg by mouth daily.    . fluticasone (FLONASE) 50 MCG/ACT nasal spray Place into the nose.    . pantoprazole (PROTONIX) 40 MG tablet Take 40 mg by mouth every morning.     . Vitamin D, Ergocalciferol, (DRISDOL) 50000 units CAPS capsule Take by mouth.    Marland Kitchen aspirin EC 81 MG tablet Take 81 mg by mouth daily.    . rosuvastatin (CRESTOR) 5 MG tablet Take 2.5 mg by mouth at bedtime.     . traMADol (ULTRAM) 50 MG tablet Take 1 tablet (50 mg total) by mouth every 8 (eight) hours as needed. 15 tablet 0  . umeclidinium-vilanterol (ANORO ELLIPTA) 62.5-25 MCG/INH AEPB Inhale 1 puff into the lungs daily as needed (Short of breath).      No facility-administered medications prior to visit.     ROS:  Review of Systems  Constitutional: Negative for fever.  Gastrointestinal: Negative for blood in stool, constipation, diarrhea, nausea and vomiting.  Genitourinary: Positive for dyspareunia and vaginal discharge. Negative for dysuria, flank pain, frequency, hematuria, urgency, vaginal bleeding and vaginal pain.  Musculoskeletal: Negative for back pain.  Skin: Negative for rash.   BREAST: No symptoms   OBJECTIVE:   Vitals:  BP (!) 100/54   Pulse 95   Ht 5\' 3"  (1.6 m)   Wt 143 lb (64.9 kg)   BMI 25.33 kg/m   Physical Exam  Constitutional: She is oriented to person, place, and time. Vital signs are normal. She appears well-developed.  Pulmonary/Chest: Effort normal.  Genitourinary: Vagina normal. There is no rash, tenderness or lesion on the right labia. There is no rash, tenderness or lesion on the left labia. Uterus is not tender. Right adnexum displays no mass and no tenderness. Left adnexum displays no mass and no tenderness. No erythema or tenderness in the vagina. No vaginal discharge  found.  Genitourinary Comments: EXT EXCORIATIONS; NO ERYTHEMA/LESIONS; UTERUS/CX SURG ABSENT  Musculoskeletal: Normal range of motion.  Neurological: She is alert and oriented to person, place, and time.  Psychiatric: She has a normal mood and affect. Her behavior is normal. Thought content normal.  Vitals reviewed.   Results: Results for  orders placed or performed in visit on 02/15/18 (from the past 24 hour(s))  POCT Wet Prep with KOH     Status: Abnormal   Collection Time: 02/15/18 10:47 AM  Result Value Ref Range   Trichomonas, UA Negative    Clue Cells Wet Prep HPF POC POS    Epithelial Wet Prep HPF POC     Yeast Wet Prep HPF POC NEG    Bacteria Wet Prep HPF POC     RBC Wet Prep HPF POC     WBC Wet Prep HPF POC     KOH Prep POC Positive (A) Negative     Assessment/Plan: Bacterial vaginosis - Pos sx/wet prep. Rx metrogel. F/u prn. If sx persist, will treat for AV given sx and hx.  - Plan: POCT Wet Prep with KOH, metroNIDAZOLE (METROGEL) 0.75 % vaginal gel  Vaginal itching - Dove sens skin soap/line dry underwear. OTC Cortisone crm BID prn sx. F/u prn.     Meds ordered this encounter  Medications  . metroNIDAZOLE (METROGEL) 0.75 % vaginal gel    Sig: Place 1 Applicatorful vaginally at bedtime for 5 days.    Dispense:  50 g    Refill:  0    Order Specific Question:   Supervising Provider    Answer:   Gae Dry [[id number]]      Return if symptoms worsen or fail to improve.  Alicia B. Copland, PA-C 02/15/2018 10:47 AM

## 2018-02-15 NOTE — Patient Instructions (Signed)
I value your feedback and entrusting us with your care. If you get a [facility] patient survey, I would appreciate you taking the time to let us know about your experience today. Thank you! 

## 2018-03-19 ENCOUNTER — Telehealth: Payer: Self-pay | Admitting: *Deleted

## 2018-03-19 DIAGNOSIS — Z122 Encounter for screening for malignant neoplasm of respiratory organs: Secondary | ICD-10-CM

## 2018-03-19 DIAGNOSIS — Z87891 Personal history of nicotine dependence: Secondary | ICD-10-CM

## 2018-03-19 NOTE — Telephone Encounter (Signed)
Patient has been notified that annual lung cancer screening low dose CT scan is due currently or will be in near future. Confirmed that patient is within the age range of 55-77, and asymptomatic, (no signs or symptoms of lung cancer). Patient denies illness that would prevent curative treatment for lung cancer if found. Verified smoking history, (current, 47 pack year). The shared decision making visit was done11/14/17. Patient is agreeable for CT scan being scheduled.

## 2018-04-10 ENCOUNTER — Ambulatory Visit
Admission: RE | Admit: 2018-04-10 | Discharge: 2018-04-10 | Disposition: A | Discharge status: Home / Self Care | Payer: Medicare HMO | Source: Ambulatory Visit | Attending: Oncology | Admitting: Oncology

## 2018-04-10 DIAGNOSIS — Z122 Encounter for screening for malignant neoplasm of respiratory organs: Secondary | ICD-10-CM | POA: Diagnosis present

## 2018-04-10 DIAGNOSIS — Z87891 Personal history of nicotine dependence: Secondary | ICD-10-CM | POA: Insufficient documentation

## 2018-04-15 ENCOUNTER — Encounter: Payer: Self-pay | Admitting: *Deleted

## 2018-04-19 DIAGNOSIS — I712 Thoracic aortic aneurysm, without rupture, unspecified: Secondary | ICD-10-CM | POA: Insufficient documentation

## 2018-04-19 DIAGNOSIS — E559 Vitamin D deficiency, unspecified: Secondary | ICD-10-CM | POA: Insufficient documentation

## 2018-04-25 ENCOUNTER — Encounter: Payer: Self-pay | Admitting: Emergency Medicine

## 2018-04-25 ENCOUNTER — Emergency Department
Admission: EM | Admit: 2018-04-25 | Discharge: 2018-04-25 | Discharge status: Against Medical Advice | Payer: Medicare HMO | Attending: Emergency Medicine | Admitting: Emergency Medicine

## 2018-04-25 ENCOUNTER — Other Ambulatory Visit: Payer: Self-pay

## 2018-04-25 DIAGNOSIS — R1010 Upper abdominal pain, unspecified: Secondary | ICD-10-CM | POA: Diagnosis present

## 2018-04-25 DIAGNOSIS — M549 Dorsalgia, unspecified: Secondary | ICD-10-CM | POA: Diagnosis not present

## 2018-04-25 DIAGNOSIS — Z5321 Procedure and treatment not carried out due to patient leaving prior to being seen by health care provider: Secondary | ICD-10-CM | POA: Diagnosis not present

## 2018-04-25 LAB — URINALYSIS, COMPLETE (UACMP) WITH MICROSCOPIC
Bacteria, UA: NONE SEEN
Bilirubin Urine: NEGATIVE
GLUCOSE, UA: NEGATIVE mg/dL
HGB URINE DIPSTICK: NEGATIVE
KETONES UR: NEGATIVE mg/dL
LEUKOCYTES UA: NEGATIVE
NITRITE: NEGATIVE
PROTEIN: NEGATIVE mg/dL
Specific Gravity, Urine: 1.004 — ABNORMAL LOW (ref 1.005–1.030)
pH: 6 (ref 5.0–8.0)

## 2018-04-25 LAB — COMPREHENSIVE METABOLIC PANEL
ALK PHOS: 71 U/L (ref 38–126)
ALT: 12 U/L (ref 0–44)
AST: 16 U/L (ref 15–41)
Albumin: 4.1 g/dL (ref 3.5–5.0)
Anion gap: 6 (ref 5–15)
BUN: 10 mg/dL (ref 8–23)
CALCIUM: 9.8 mg/dL (ref 8.9–10.3)
CO2: 28 mmol/L (ref 22–32)
Chloride: 108 mmol/L (ref 98–111)
Creatinine, Ser: 0.75 mg/dL (ref 0.44–1.00)
GFR calc non Af Amer: >60 mL/min (ref >60)
Glucose, Bld: 101 mg/dL — ABNORMAL HIGH (ref 70–99)
Potassium: 4.6 mmol/L (ref 3.5–5.1)
SODIUM: 142 mmol/L (ref 135–145)
TOTAL PROTEIN: 7.1 g/dL (ref 6.5–8.1)
Total Bilirubin: 0.8 mg/dL (ref 0.3–1.2)

## 2018-04-25 LAB — CBC
HCT: 43.5 % (ref 36.0–46.0)
Hemoglobin: 15 g/dL (ref 12.0–15.0)
MCH: 32.8 pg (ref 26.0–34.0)
MCHC: 34.5 g/dL (ref 30.0–36.0)
MCV: 95 fL (ref 80.0–100.0)
NRBC: 0 % (ref 0.0–0.2)
PLATELETS: 228 10*3/uL (ref 150–400)
RBC: 4.58 MIL/uL (ref 3.87–5.11)
RDW: 14.8 % (ref 11.5–15.5)
WBC: 10.8 10*3/uL — ABNORMAL HIGH (ref 4.0–10.5)

## 2018-04-25 LAB — TROPONIN I: Troponin I: <0.03 ng/mL (ref <0.03)

## 2018-04-25 LAB — LIPASE, BLOOD: Lipase: 35 U/L (ref 11–51)

## 2018-04-25 NOTE — ED Notes (Signed)
Pt walked out. Would not acknowledge me when questioned, but husband states she wanted to leave.

## 2018-04-25 NOTE — ED Notes (Signed)
Pt states back and abd pain. Has her gallbladder removed. Upset that she had to wait in the lobby so long. Advised the dr will look at labs and see what other tests he needs to add on.

## 2018-04-25 NOTE — ED Notes (Signed)
EKG appears different compared to old. Discussed with dr Joni Fears. Troponin added.

## 2018-04-25 NOTE — ED Triage Notes (Signed)
Here for upper mid abdominal pain for 6 days that comes in the afternoons and through the evening.  Pain is also in back as well.  Pt reports if lays on heating bad sometimes pain gets better.  Denies NVD.  Denies urinary sx.  No known fevers.  Unsure if worse after eating.  Pain is better in AM per pt.

## 2018-04-30 ENCOUNTER — Telehealth: Payer: Self-pay | Admitting: Emergency Medicine

## 2018-04-30 NOTE — Telephone Encounter (Signed)
Called patient due to lwot to inquire about condition and follow up plans.  No answer and no voicemail  

## 2018-05-06 ENCOUNTER — Encounter: Payer: Self-pay | Admitting: Obstetrics and Gynecology

## 2018-05-06 ENCOUNTER — Ambulatory Visit (INDEPENDENT_AMBULATORY_CARE_PROVIDER_SITE_OTHER): Payer: Medicare HMO | Admitting: Obstetrics and Gynecology

## 2018-05-06 VITALS — BP 116/64 | HR 77 | Ht 63.0 in | Wt 136.0 lb

## 2018-05-06 DIAGNOSIS — B9689 Other specified bacterial agents as the cause of diseases classified elsewhere: Secondary | ICD-10-CM | POA: Diagnosis not present

## 2018-05-06 DIAGNOSIS — N760 Acute vaginitis: Secondary | ICD-10-CM | POA: Diagnosis not present

## 2018-05-06 DIAGNOSIS — N898 Other specified noninflammatory disorders of vagina: Secondary | ICD-10-CM | POA: Diagnosis not present

## 2018-05-06 LAB — POCT WET PREP WITH KOH
KOH PREP POC: POSITIVE — AB
Trichomonas, UA: NEGATIVE
Yeast Wet Prep HPF POC: NEGATIVE

## 2018-05-06 MED ORDER — METRONIDAZOLE 0.75 % VA GEL: 1.0000 | Freq: Every day | VAGINAL | 0 refills | Status: AC

## 2018-05-06 NOTE — Progress Notes (Signed)
Molly Peach, MD   Chief Complaint  Patient presents with  . Vaginitis    itching, discharge, not sure about odor or irritation, its been on and off since last visit    HPI:      [name] is a 68 y.o. No obstetric history on file. who LMP was No LMP recorded. Patient has had a hysterectomy., presents today for increased vag d/c, possible odor, and significant itching for the past 3-4 days. Had similar sx 02/15/18 and was found to have BV. Treated with metrogel and sx resolved until recently. No recent abx use. Pt changed to dove sens skin soap.    Past Medical History:  Diagnosis Date  . Anxiety   . Arthritis   . Cancer ([place]) 01/2017   Skin cancer on right leg  . Carotid artery stenosis   . Carotid stenosis, left    100 % blockage  . COPD (chronic obstructive pulmonary disease) (HCC)    mild  . Depression   . Diverticulosis   . Esophagitis   . Fatty liver disease, nonalcoholic   . GERD (gastroesophageal reflux disease)   . Herniated lumbar intervertebral disc   . Hyperlipidemia   . IBS (irritable bowel syndrome)   . Osteopenia   . Peptic ulcer disease   . Personal history of tobacco use, presenting hazards to health 03/14/2016  . Stroke ([place])    TIA's X 2  . Vitamin D deficiency     Past Surgical History:  Procedure Laterality Date  . ABDOMINAL HYSTERECTOMY    . BREAST BIOPSY Left    negative 2008  . CHOLECYSTECTOMY    . DILATION AND CURETTAGE OF UTERUS    . ESOPHAGOGASTRODUODENOSCOPY (EGD) WITH PROPOFOL N/A 01/26/2017   Procedure: ESOPHAGOGASTRODUODENOSCOPY (EGD) WITH PROPOFOL;  Surgeon: Toledo, Benay Pike, MD;  Location: [facility] END[facility]COPY;  Service: Gastroenterology;  Laterality: N/A;  . HEMORRHOID SURGERY N/A 07/25/2016   Procedure: HEMORRHOIDECTOMY INTERNAL AND EXTERNAL;  Surgeon: Leonie Green, MD;  Location: [facility] ORS;  Service: General;  Laterality: N/A;  . VASCULAR SURGERY Right    Carotid Endarterectomy     Family History  Problem  Relation Age of Onset  . Breast cancer Mother 36  . Atrial fibrillation Mother   . Breast cancer Sister 18  . Heart attack Father   . Colon cancer Brother   . Lung cancer Brother     Social History   Socioeconomic History  . Marital status: Married    Spouse name: Not on file  . Number of children: Not on file  . Years of education: Not on file  . Highest education level: Not on file  Occupational History  . Not on file  Social Needs  . Financial resource strain: Not on file  . Food insecurity:    Worry: Not on file    Inability: Not on file  . Transportation needs:    Medical: Not on file    Non-medical: Not on file  Tobacco Use  . Smoking status: Current Every Day Smoker    Packs/day: 1.00    Years: 45.00    Pack years: 45.00    Types: Cigarettes  . Smokeless tobacco: Never Used  Substance and Sexual Activity  . Alcohol use: No  . Drug use: No  . Sexual activity: Yes    Birth control/protection: Surgical    Comment: Hysterectomy  Lifestyle  . Physical activity:    Days per week: Not on file  Minutes per session: Not on file  . Stress: Not on file  Relationships  . Social connections:    Talks on phone: Not on file    Gets together: Not on file    Attends religious service: Not on file    Active member of club or organization: Not on file    Attends meetings of clubs or organizations: Not on file    Relationship status: Not on file  . Intimate partner violence:    Fear of current or ex partner: Not on file    Emotionally abused: Not on file    Physically abused: Not on file    Forced sexual activity: Not on file  Other Topics Concern  . Not on file  Social History Narrative  . Not on file    Outpatient Medications Prior to Visit  Medication Sig Dispense Refill  . albuterol (PROVENTIL HFA;VENTOLIN HFA) 108 (90 Base) MCG/ACT inhaler Inhale 2 puffs into the lungs every 6 (six) hours as needed for wheezing or shortness of breath.    . busPIRone  (BUSPAR) 7.5 MG tablet Take by mouth.    . citalopram (CELEXA) 40 MG tablet Take 40 mg by mouth at bedtime.     . clopidogrel (PLAVIX) 75 MG tablet Take 75 mg by mouth daily.    . fluticasone (FLONASE) 50 MCG/ACT nasal spray Place into the nose.    . pantoprazole (PROTONIX) 40 MG tablet Take 40 mg by mouth every morning.     . rosuvastatin (CRESTOR) 5 MG tablet Take 1/2  tab by mouth every other day M, W, F    . cetirizine (ZYRTEC) 10 MG tablet Take 10 mg by mouth daily.    . Vitamin D, Ergocalciferol, (DRISDOL) 50000 units CAPS capsule Take by mouth.     No facility-administered medications prior to visit.       ROS:  Review of Systems  Constitutional: Negative for fever.  Gastrointestinal: Negative for blood in stool, constipation, diarrhea, nausea and vomiting.  Genitourinary: Positive for vaginal discharge. Negative for dyspareunia, dysuria, flank pain, frequency, hematuria, urgency, vaginal bleeding and vaginal pain.  Musculoskeletal: Negative for back pain.  Skin: Negative for rash.    OBJECTIVE:   Vitals:  BP 116/64   Pulse 77   Ht 5\' 3"  (1.6 m)   Wt 136 lb (61.7 kg)   BMI 24.09 kg/m   Physical Exam Vitals signs reviewed.  Constitutional:      Appearance: She is well-developed.  Pulmonary:     Effort: Pulmonary effort is normal.  Genitourinary:    Pubic Area: No rash.      Labia:        Right: No rash, tenderness or lesion.        Left: No rash, tenderness or lesion.      Vagina: Normal. No vaginal discharge, erythema or tenderness.     Cervix: Normal.     Uterus: Normal. Not enlarged and not tender.      Adnexa: Right adnexa normal and left adnexa normal.       Right: No mass or tenderness.         Left: No mass or tenderness.    Musculoskeletal: Normal range of motion.  Neurological:     Mental Status: She is alert and oriented to person, place, and time.  Psychiatric:        Behavior: Behavior normal.        Thought Content: Thought content normal.      Results:  Results for orders placed or performed in visit on 05/06/18 (from the past 24 hour(s))  POCT Wet Prep with KOH     Status: Abnormal   Collection Time: 05/06/18  4:04 PM  Result Value Ref Range   Trichomonas, UA Negative    Clue Cells Wet Prep HPF POC few    Epithelial Wet Prep HPF POC     Yeast Wet Prep HPF POC neg    Bacteria Wet Prep HPF POC     RBC Wet Prep HPF POC     WBC Wet Prep HPF POC     KOH Prep POC Positive (A) Negative     Assessment/Plan: Bacterial vaginosis - Pos wet prep. [place] with Rx metrogel (sent to [facility] with [email or web address] coupon). Check One Swab culture. Will call with results.  - Plan: metroNIDAZOLE (METROGEL) 0.75 % vaginal gel, POCT Wet Prep with KOH, Other/Misc lab test  Vaginal itching - Neg exam but sx are significant. pt with BV but that doesn't explain severity of sx. Check culture. Will call with results.  - Plan: Other/Misc lab test    Meds ordered this encounter  Medications  . metroNIDAZOLE (METROGEL) 0.75 % vaginal gel    Sig: Place 1 Applicatorful vaginally at bedtime for 5 days.    Dispense:  50 g    Refill:  0    Order Specific Question:   Supervising Provider    Answer:   Gae Dry [[id number]]      Return if symptoms worsen or fail to improve.  Gissella Niblack B. Arah Aro, PA-C 05/06/2018 4:06 PM

## 2018-05-06 NOTE — Patient Instructions (Signed)
I value your feedback and entrusting us with your care. If you get a [facility] patient survey, I would appreciate you taking the time to let us know about your experience today. Thank you! 

## 2018-05-14 ENCOUNTER — Telehealth: Payer: Self-pay | Admitting: Obstetrics and Gynecology

## 2018-05-14 NOTE — Telephone Encounter (Signed)
  One Swab results showed atopobium (BV) but neg candida/AV. Pt treated with metrogel 05/06/18. Will treat with clindamycin if sx recur.

## 2018-05-23 ENCOUNTER — Ambulatory Visit (INDEPENDENT_AMBULATORY_CARE_PROVIDER_SITE_OTHER): Payer: Medicare HMO

## 2018-05-23 ENCOUNTER — Other Ambulatory Visit: Payer: Self-pay

## 2018-05-23 ENCOUNTER — Encounter: Payer: Self-pay | Admitting: Emergency Medicine

## 2018-05-23 ENCOUNTER — Ambulatory Visit
Admission: EM | Admit: 2018-05-23 | Discharge: 2018-05-23 | Disposition: A | Discharge status: Home / Self Care | Payer: Medicare HMO | Attending: Family Medicine | Admitting: Family Medicine

## 2018-05-23 DIAGNOSIS — W19XXXA Unspecified fall, initial encounter: Secondary | ICD-10-CM

## 2018-05-23 DIAGNOSIS — S9031XA Contusion of right foot, initial encounter: Secondary | ICD-10-CM

## 2018-05-23 DIAGNOSIS — S93601A Unspecified sprain of right foot, initial encounter: Secondary | ICD-10-CM | POA: Diagnosis not present

## 2018-05-23 NOTE — ED Provider Notes (Signed)
[facility]    CSN: [id number] Arrival date & time: 05/23/18  1155     History   Chief Complaint Chief Complaint  Patient presents with  . Fall  . Ankle Pain    HPI Molly Matthews is a 68 y.o. female.   HPI  68 year old female accompanied by her husband states that she fell this morning while taking the trash out.  Does not know the exact mechanism of injury but does suspect that the trash can fell on her foot.  No ecchymosis but does have some swelling.  There is no break in the skin integrity.  Her tenderness appears to be over the tarsometatarsal junction 2, 3 and 4.  Limping into the facility applying pressure on her heel only.         Past Medical History:  Diagnosis Date  . Anxiety   . Arthritis   . Cancer ([place]) 01/2017   Skin cancer on right leg  . Carotid artery stenosis   . Carotid stenosis, left    100 % blockage  . COPD (chronic obstructive pulmonary disease) (HCC)    mild  . Depression   . Diverticulosis   . Esophagitis   . Fatty liver disease, nonalcoholic   . GERD (gastroesophageal reflux disease)   . Herniated lumbar intervertebral disc   . Hyperlipidemia   . IBS (irritable bowel syndrome)   . Osteopenia   . Peptic ulcer disease   . Personal history of tobacco use, presenting hazards to health 03/14/2016  . 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 Vitamin D deficiency     Patient Active Problem List   Diagnosis Date Noted  . Carotid stenosis 08/24/2016  . PAD (peripheral artery disease) ([place]) 08/24/2016  . COPD mixed type ([place]) 08/24/2016  . Hyperlipidemia 08/24/2016  . Personal history of tobacco use, presenting hazards to health 03/14/2016    Past Surgical History:  Procedure Laterality Date  . ABDOMINAL HYSTERECTOMY    . BREAST BIOPSY Left    negative 2008  . CHOLECYSTECTOMY    . DILATION AND CURETTAGE OF UTERUS    . ESOPHAGOGASTRODUODENOSCOPY (EGD) WITH PROPOFOL N/A 01/26/2017   Procedure: ESOPHAGOGASTRODUODENOSCOPY (EGD)  WITH PROPOFOL;  Surgeon: Toledo, Benay Pike, MD;  Location: [facility];  Service: Gastroenterology;  Laterality: N/A;  . HEMORRHOID SURGERY N/A 07/25/2016   Procedure: HEMORRHOIDECTOMY INTERNAL AND EXTERNAL;  Surgeon: Leonie Green, MD;  Location: [facility];  Service: General;  Laterality: N/A;  . VASCULAR SURGERY Right    Carotid Endarterectomy     OB History   No obstetric history on file.      Home Medications    Prior to Admission medications   Medication Sig Start Date End Date Taking? Authorizing Provider  albuterol (PROVENTIL HFA;VENTOLIN HFA) 108 (90 Base) MCG/ACT inhaler Inhale 2 puffs into the lungs every 6 (six) hours as needed for wheezing or shortness of breath.   Yes [provider]  busPIRone (BUSPAR) 7.5 MG tablet Take by mouth. 12/17/17 12/17/18 Yes [provider]  citalopram (CELEXA) 40 MG tablet Take 40 mg by mouth at bedtime.    Yes [provider]  clopidogrel (PLAVIX) 75 MG tablet Take 75 mg by mouth daily.   Yes [provider]  fluticasone (FLONASE) 50 MCG/ACT nasal spray Place into the nose. 09/26/17 09/26/18 Yes [provider]  pantoprazole (PROTONIX) 40 MG tablet Take 40 mg by mouth every morning.    Yes [provider]  rosuvastatin (  CRESTOR) 5 MG tablet Take 1/2  tab by mouth every other day M, W, F 04/19/18  Yes [provider]    Family History Family History  Problem Relation Age of Onset  . Breast cancer Mother 77  . Atrial fibrillation Mother   . Breast cancer Sister 1  . Heart attack Father   . Colon cancer Brother   . Lung cancer Brother     Social History Social History   Tobacco Use  . Smoking status: Current Every Day Smoker    Packs/day: 1.00    Years: 45.00    Pack years: 45.00    Types: Cigarettes  . Smokeless tobacco: Never Used  Substance Use Topics  . Alcohol use: No  . Drug use: No     Allergies   Statins; Zithromax [azithromycin]; and  Sulfur   Review of Systems Review of Systems  Constitutional: Positive for activity change. Negative for appetite change, chills, fatigue and fever.  Musculoskeletal: Positive for arthralgias, gait problem and joint swelling.  All other systems reviewed and are negative.    Physical Exam Triage Vital Signs ED Triage Vitals  Enc Vitals Group     BP 05/23/18 1215 (!) 110/49     Pulse Rate 05/23/18 1215 65     Resp 05/23/18 1215 18     Temp 05/23/18 1215 98.7 F (37.1 C)     Temp Source 05/23/18 1215 Oral     SpO2 05/23/18 1215 98 %     Weight 05/23/18 1213 134 lb (60.8 kg)     Height 05/23/18 1213 5\' 3"  (1.6 m)     Head Circumference --      Peak Flow --      Pain Score 05/23/18 1213 10     Pain Loc --      Pain Edu? --      Excl. in [place]? --    No data found.  Updated Vital Signs BP (!) 110/49 (BP Location: Left Arm)   Pulse 65   Temp 98.7 F (37.1 C) (Oral)   Resp 18   Ht 5\' 3"  (1.6 m)   Wt 134 lb (60.8 kg)   SpO2 98%   BMI 23.74 kg/m   Visual Acuity Right Eye Distance:   Left Eye Distance:   Bilateral Distance:    Right Eye Near:   Left Eye Near:    Bilateral Near:     Physical Exam Vitals signs and nursing note reviewed.  Constitutional:      General: She is not in acute distress.    Appearance: Normal appearance. She is normal weight. She is not ill-appearing, toxic-appearing or diaphoretic.  HENT:     Head: Normocephalic and atraumatic.  Eyes:     General:        Right eye: No discharge.        Left eye: No discharge.     Conjunctiva/sclera: Conjunctivae normal.  Neck:     Musculoskeletal: Normal range of motion and neck supple.  Musculoskeletal:        General: Swelling and tenderness present.     Comments: She has decreased range of motion to dorsiflexion lacking approximately 10 degrees plantar flexion to 25 degrees.  Subtalar motion is intact.  Swelling over the dorsum of her foot around the metatarsal tarsal junction.  No tenderness with  compression of the fibula and tibia distally.  He has no tenderness over either malleolus.  Heel is nontender.  She has no tenderness along  the first metatarsal.  There is no tenderness along the fifth metatarsal from the base to the MTP junction. Tenderness appears to be over the tarsometatarsal junction of to 3 and 4.  Where maximum swelling appears to be.  Neurological:     Mental Status: She is alert.      UC Treatments / Results  Labs (all labs ordered are listed, but only abnormal results are displayed) Labs Reviewed - No data to display  EKG None  Radiology Dg Foot Complete Right  Result Date: 05/23/2018 CLINICAL DATA:  Fall injuring right foot. EXAM: RIGHT FOOT COMPLETE - 3+ VIEW COMPARISON:  01/23/2012 FINDINGS: No evidence of acute fracture or dislocation. Moderate size calcaneal spur. No significant degenerative changes. IMPRESSION: No acute findings. Electronically Signed   By: Marin Olp M.D.   On: 05/23/2018 13:48    Procedures Procedures (including critical care time)  Medications Ordered in UC Medications - No data to display  Initial Impression / Assessment and Plan / UC Course  I have reviewed the triage vital signs and the nursing notes.  Pertinent labs & imaging results that were available during my care of the patient were reviewed by me and considered in my medical decision making (see chart for details).           Elevate your foot above the level of your heart sufficiently to control swelling and pain.  Avoid symptoms much as possible by not standing for prolonged periods or walking for a long distance.  Wear the post op shoe for activities ;may remove the boot for quiet times and personal care. If you are not improving follow-up at [facility].    Final Clinical Impressions(s) / UC Diagnoses   Final diagnoses:  Right foot sprain, initial encounter  Contusion of right foot, initial encounter     Discharge Instructions     Elevate  your foot above the level of your heart sufficiently to control swelling and pain.  Avoid symptoms much as possible by not standing for prolonged periods or walking for a long distance.  Wear the boot for activities ;may remove the boot for quiet times and personal care. If you are not improving follow-up at [facility].    ED Prescriptions    None     Controlled Substance Prescriptions [place] Controlled Substance Registry consulted? Not Applicable   Lorin Picket, PA-C 05/23/18 1456

## 2018-05-23 NOTE — ED Triage Notes (Signed)
Patient stated she fell this morning when she took the trash out. She stated she twisted her right foot when she fell.

## 2018-05-23 NOTE — Discharge Instructions (Addendum)
Elevate your foot above the level of your heart sufficiently to control swelling and pain.  Avoid symptoms much as possible by not standing for prolonged periods or walking for a long distance.  Wear the boot for activities ;may remove the boot for quiet times and personal care. If you are not improving follow-up at [facility].

## 2018-08-29 ENCOUNTER — Encounter (INDEPENDENT_AMBULATORY_CARE_PROVIDER_SITE_OTHER): Payer: Medicare HMO

## 2018-08-29 ENCOUNTER — Ambulatory Visit (INDEPENDENT_AMBULATORY_CARE_PROVIDER_SITE_OTHER): Payer: Medicare HMO | Admitting: Vascular Surgery

## 2018-09-02 ENCOUNTER — Encounter (INDEPENDENT_AMBULATORY_CARE_PROVIDER_SITE_OTHER): Payer: Self-pay | Admitting: Vascular Surgery

## 2018-09-02 ENCOUNTER — Ambulatory Visit (INDEPENDENT_AMBULATORY_CARE_PROVIDER_SITE_OTHER): Payer: Medicare Other

## 2018-09-02 ENCOUNTER — Ambulatory Visit (INDEPENDENT_AMBULATORY_CARE_PROVIDER_SITE_OTHER): Payer: Medicare Other | Admitting: Vascular Surgery

## 2018-09-02 ENCOUNTER — Other Ambulatory Visit: Payer: Self-pay

## 2018-09-02 VITALS — BP 125/74 | HR 81 | Resp 16 | Ht 63.0 in | Wt 140.0 lb

## 2018-09-02 DIAGNOSIS — I6523 Occlusion and stenosis of bilateral carotid arteries: Secondary | ICD-10-CM

## 2018-09-02 DIAGNOSIS — J449 Chronic obstructive pulmonary disease, unspecified: Secondary | ICD-10-CM | POA: Diagnosis not present

## 2018-09-02 DIAGNOSIS — I739 Peripheral vascular disease, unspecified: Secondary | ICD-10-CM

## 2018-09-02 DIAGNOSIS — Z7982 Long term (current) use of aspirin: Secondary | ICD-10-CM

## 2018-09-02 DIAGNOSIS — F17210 Nicotine dependence, cigarettes, uncomplicated: Secondary | ICD-10-CM

## 2018-09-02 DIAGNOSIS — E782 Mixed hyperlipidemia: Secondary | ICD-10-CM | POA: Diagnosis not present

## 2018-09-02 NOTE — Progress Notes (Signed)
MRN : [id number]  Molly Matthews is a 68 y.o. ([birth year]) female who presents with chief complaint of No chief complaint on file. Marland Kitchen  History of Present Illness:   The patient is seen for follow up evaluation of carotid stenosis. The carotid stenosis followed by ultrasound.   The patient denies amaurosis fugax. There is no recent history of TIA symptoms or focal motor deficits. There is no prior documented CVA.  The patient is taking enteric-coated aspirin 81 mg daily.  There is no history of migraine headaches. There is no history of seizures.  The patient is also here for followup and review of noninvasive studies of the lower extremities. There have been no interval changes in lower extremity symptoms. No interval shortening of the patient's claudication distance or development of rest pain symptoms. No new ulcers or wounds have occurred since the last visit.  There have been no significant changes to the patient's overall health care.  The patient has a history of coronary artery disease, no recent episodes of angina or shortness of breath. There is a history of hyperlipidemia which is being treated with a statin.   Carotid Duplex done today shows right ICA <30% and known occlusion of the left ICA.  No change compared to last study in 08/11/2017  ABI Rt=0.87 and Lt=1.01  (previous ABI's Rt=0.88 and Lt=1.01)  No outpatient medications have been marked as taking for the 09/02/18 encounter (Appointment) with Delana Meyer, Dolores Lory, MD.    Past Medical History:  Diagnosis Date  . Anxiety   . Arthritis   . Cancer ([place]) 01/2017   Skin cancer on right leg  . Carotid artery stenosis   . Carotid stenosis, left    100 % blockage  . COPD (chronic obstructive pulmonary disease) (HCC)    mild  . Depression   . Diverticulosis   . Esophagitis   . Fatty liver disease, nonalcoholic   . GERD (gastroesophageal reflux disease)   . Herniated lumbar intervertebral disc   .  Hyperlipidemia   . IBS (irritable bowel syndrome)   . Osteopenia   . Peptic ulcer disease   . Personal history of tobacco use, presenting hazards to health 03/14/2016  . 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 Vitamin D deficiency     Past Surgical History:  Procedure Laterality Date  . ABDOMINAL HYSTERECTOMY    . BREAST BIOPSY Left    negative 2008  . CHOLECYSTECTOMY    . DILATION AND CURETTAGE OF UTERUS    . ESOPHAGOGASTRODUODENOSCOPY (EGD) WITH PROPOFOL N/A 01/26/2017   Procedure: ESOPHAGOGASTRODUODENOSCOPY (EGD) WITH PROPOFOL;  Surgeon: Toledo, Benay Pike, MD;  Location: [facility];  Service: Gastroenterology;  Laterality: N/A;  . HEMORRHOID SURGERY N/A 07/25/2016   Procedure: HEMORRHOIDECTOMY INTERNAL AND EXTERNAL;  Surgeon: Leonie Green, MD;  Location: [facility];  Service: General;  Laterality: N/A;  . VASCULAR SURGERY Right    Carotid Endarterectomy     Social History Social History   Tobacco Use  . Smoking status: Current Every Day Smoker    Packs/day: 1.00    Years: 45.00    Pack years: 45.00    Types: Cigarettes  . Smokeless tobacco: Never Used  Substance Use Topics  . Alcohol use: No  . Drug use: No    Family History Family History  Problem Relation Age of Onset  . Breast cancer Mother 72  . Atrial fibrillation Mother   . Breast cancer Sister 82  . Heart attack  Father   . Colon cancer Brother   . Lung cancer Brother     Allergies  Allergen Reactions  . Statins Other (See Comments)    Muscle and joint aches - can take in small doses  . Zithromax [Azithromycin] Other (See Comments)    Back Pain  . Sulfur Rash     REVIEW OF SYSTEMS (Negative unless checked)  Constitutional: [] Weight loss  [] Fever  [] Chills Cardiac: [] Chest pain   [] Chest pressure   [] Palpitations   [] Shortness of breath when laying flat   [] Shortness of breath with exertion. Vascular:  [x] Pain in legs with walking   [] Pain in legs at rest  [] History of DVT   [] Phlebitis    [] Swelling in legs   [] Varicose veins   [] Non-healing ulcers Pulmonary:   [] Uses home oxygen   [] Productive cough   [] Hemoptysis   [] Wheeze  [x] COPD   [] Asthma Neurologic:  [] Dizziness   [] Seizures   [] History of stroke   [] History of TIA  [] Aphasia   [] Vissual changes   [] Weakness or numbness in arm   [] Weakness or numbness in leg Musculoskeletal:   [] Joint swelling   [x] Joint pain   [] Low back pain Hematologic:  [] Easy bruising  [] Easy bleeding   [] Hypercoagulable state   [] Anemic Gastrointestinal:  [] Diarrhea   [] Vomiting  [] Gastroesophageal reflux/heartburn   [] Difficulty swallowing. Genitourinary:  [] Chronic kidney disease   [] Difficult urination  [] Frequent urination   [] Blood in urine Skin:  [] Rashes   [] Ulcers  Psychological:  [] History of anxiety   []  History of major depression.  Physical Examination  There were no vitals filed for this visit. There is no height or weight on file to calculate BMI. Gen: WD/WN, NAD Head: [place]/AT, No temporalis wasting.  Ear/Nose/Throat: Hearing grossly intact, nares w/o erythema or drainage Eyes: PER, EOMI, sclera nonicteric.  Neck: Supple, no large masses.   Pulmonary:  Good air movement, no audible wheezing bilaterally, no use of accessory muscles.  Cardiac: RRR, no JVD Vascular:  Well healed right CEA incisional scar, bilateral bruits Vessel Right Left  Radial Palpable Palpable  Carotid Palpable Palpable  PT Not Palpable Palpable  DP Trace Palpable Palpable  Gastrointestinal: Non-distended. No guarding/no peritoneal signs.  Musculoskeletal: M/S 5/5 throughout.  No deformity or atrophy.  Neurologic: CN 2-12 intact. Symmetrical.  Speech is fluent. Motor exam as listed above. Psychiatric: Judgment intact, Mood & affect appropriate for pt's clinical situation. Dermatologic: No rashes or ulcers noted.  No changes consistent with cellulitis. Lymph : No lichenification or skin changes of chronic lymphedema.  CBC Lab Results  Component Value Date    WBC 10.8 (H) 04/25/2018   HGB 15.0 04/25/2018   HCT 43.5 04/25/2018   MCV 95.0 04/25/2018   PLT 228 04/25/2018    BMET    Component Value Date/Time   NA 142 04/25/2018 1713   NA 142 01/29/2014 2124   K 4.6 04/25/2018 1713   K 4.0 01/29/2014 2124   CL 108 04/25/2018 1713   CL 110 (H) 01/29/2014 2124   CO2 28 04/25/2018 1713   CO2 25 01/29/2014 2124   GLUCOSE 101 (H) 04/25/2018 1713   GLUCOSE 137 (H) 01/29/2014 2124   BUN 10 04/25/2018 1713   BUN 10 01/29/2014 2124   CREATININE 0.75 04/25/2018 1713   CREATININE 1.00 01/29/2014 2124   CALCIUM 9.8 04/25/2018 1713   CALCIUM 9.3 01/29/2014 2124   GFRNONAA >60 04/25/2018 1713   GFRNONAA 59 (L) 01/29/2014 2124   GFRNONAA >60 12/22/2013 1159  GFRAA >60 04/25/2018 1713   GFRAA >60 01/29/2014 2124   GFRAA >60 12/22/2013 1159   CrCl cannot be calculated (Patient's most recent lab result is older than the maximum 21 days allowed.).  COAG No results found for: INR, PROTIME  Radiology No results found.    Assessment/Plan 1. Bilateral carotid artery stenosis Recommend:  Given the patient's asymptomatic subcritical stenosis no further invasive testing or surgery at this time.  Duplex ultrasound shows right ICA <30% and known occlusion of the left ICA.   Continue antiplatelet therapy as prescribed Continue management of CAD, HTN and Hyperlipidemia Healthy heart diet,  encouraged exercise at least 4 times per week Follow up in 12 months with duplex ultrasound and physical exam based on <50% stenosis of the right carotid artery   - VAS US CAROTID; Future  2. PAD (peripheral artery disease) (HCC) Recommend:  The patient has evidence of atherosclerosis of the lower extremities with claudication.  The patient does not voice lifestyle limiting changes at this point in time.  Noninvasive studies do not suggest clinically significant change.  No invasive studies, angiography or surgery at this time The patient should  continue walking and begin a more formal exercise program.  The patient should continue antiplatelet therapy and aggressive treatment of the lipid abnormalities  No changes in the patient's medications at this time  The patient should continue wearing graduated compression socks 10-15 mmHg strength to control the mild edema.  - VAS [place] ABI WITH/WO TBI; Future   3. COPD mixed type ([place]) Continue pulmonary medications and aerosols as already ordered, these medications have been reviewed and there are no changes at this time.   4. Mixed hyperlipidemia Continue statin as ordered and reviewed, no changes at this time      Hortencia Pilar, MD  09/02/2018 8:40 AM

## 2018-10-03 ENCOUNTER — Other Ambulatory Visit: Payer: Self-pay

## 2018-10-03 ENCOUNTER — Ambulatory Visit (INDEPENDENT_AMBULATORY_CARE_PROVIDER_SITE_OTHER): Payer: Medicare Other

## 2018-10-03 ENCOUNTER — Encounter: Payer: Self-pay | Admitting: Emergency Medicine

## 2018-10-03 ENCOUNTER — Ambulatory Visit
Admission: EM | Admit: 2018-10-03 | Discharge: 2018-10-03 | Disposition: A | Discharge status: Home / Self Care | Payer: Medicare Other | Attending: Emergency Medicine | Admitting: Emergency Medicine

## 2018-10-03 DIAGNOSIS — M25421 Effusion, right elbow: Secondary | ICD-10-CM

## 2018-10-03 DIAGNOSIS — M79601 Pain in right arm: Secondary | ICD-10-CM | POA: Diagnosis not present

## 2018-10-03 DIAGNOSIS — M25521 Pain in right elbow: Secondary | ICD-10-CM

## 2018-10-03 DIAGNOSIS — M79631 Pain in right forearm: Secondary | ICD-10-CM

## 2018-10-03 DIAGNOSIS — S52124A Nondisplaced fracture of head of right radius, initial encounter for closed fracture: Secondary | ICD-10-CM

## 2018-10-03 MED ORDER — OXYCODONE-ACETAMINOPHEN 5-325 MG PO TABS: 1.0000 | ORAL_TABLET | Freq: Four times a day (QID) | ORAL | 0 refills | Status: DC | PRN

## 2018-10-03 NOTE — ED Provider Notes (Signed)
[facility] ____________________________________________  Time seen: Approximately 5:39 PM  I have reviewed the triage vital signs and the nursing notes.   HISTORY  Chief Complaint Arm Pain   HPI Molly Matthews is a 68 y.o. female presenting for evaluation of right arm pain post fall that occurred this afternoon.  Patient states that she was standing in a chair hanging curtains and tripped over the chair causing her to fall directly onto right arm.  States that she does feel sore all over but states she really only hurt her right arm.  States pain is present in the lower arm.  Denies pain radiation, paresthesias.  Denies head injury or loss of conscious.  Reports right-hand-dominant.  No alleviating measures attempted.  Denies aggravating factors.  [name], MD: PCP   Past Medical History:  Diagnosis Date  . Anxiety   . Arthritis   . Cancer ([place]) 01/2017   Skin cancer on right leg  . Carotid artery stenosis   . Carotid stenosis, left    100 % blockage  . COPD (chronic obstructive pulmonary disease) (HCC)    mild  . Depression   . Diverticulosis   . Esophagitis   . Fatty liver disease, nonalcoholic   . GERD (gastroesophageal reflux disease)   . Herniated lumbar intervertebral disc   . Hyperlipidemia   . IBS (irritable bowel syndrome)   . Osteopenia   . Peptic ulcer disease   . Personal history of tobacco use, presenting hazards to health 03/14/2016  . Stroke ([place])    TIA's X 2  . Vitamin D deficiency     Patient Active Problem List   Diagnosis Date Noted  . Carotid stenosis 08/24/2016  . PAD (peripheral artery disease) ([place]) 08/24/2016  . COPD mixed type ([place]) 08/24/2016  . Hyperlipidemia 08/24/2016  . Personal history of tobacco use, presenting hazards to health 03/14/2016    Past Surgical History:  Procedure Laterality Date  . ABDOMINAL HYSTERECTOMY    . BREAST BIOPSY Left    negative 2008  . CHOLECYSTECTOMY    . DILATION AND  CURETTAGE OF UTERUS    . ESOPHAGOGASTRODUODENOSCOPY (EGD) WITH PROPOFOL N/A 01/26/2017   Procedure: ESOPHAGOGASTRODUODENOSCOPY (EGD) WITH PROPOFOL;  Surgeon: Toledo, Benay Pike, MD;  Location: [facility] ENDOSCOPY;  Service: Gastroenterology;  Laterality: N/A;  . HEMORRHOID SURGERY N/A 07/25/2016   Procedure: HEMORRHOIDECTOMY INTERNAL AND EXTERNAL;  Surgeon: Leonie Green, MD;  Location: [facility] ORS;  Service: General;  Laterality: N/A;  . VASCULAR SURGERY Right    Carotid Endarterectomy      No current facility-administered medications for this encounter.   Current Outpatient Medications:  .  albuterol (PROVENTIL HFA;VENTOLIN HFA) 108 (90 Base) MCG/ACT inhaler, Inhale 2 puffs into the lungs every 6 (six) hours as needed for wheezing or shortness of breath., Disp: , Rfl:  .  citalopram (CELEXA) 40 MG tablet, Take 40 mg by mouth at bedtime. , Disp: , Rfl:  .  clopidogrel (PLAVIX) 75 MG tablet, Take 75 mg by mouth daily., Disp: , Rfl:  .  fluticasone (FLONASE) 50 MCG/ACT nasal spray, Place 2 sprays into the nose as needed. , Disp: , Rfl:  .  pantoprazole (PROTONIX) 40 MG tablet, Take 40 mg by mouth every morning. , Disp: , Rfl:  .  rosuvastatin (CRESTOR) 5 MG tablet, Take 1/2  tab by mouth every other day M, W, F, Disp: , Rfl:  .  oxyCODONE-acetaminophen (PERCOCET/ROXICET) 5-325 MG tablet, Take 1 tablet by mouth every 6 (six)  hours as needed for severe pain. Do not drive while taking as can cause drowsiness., Disp: 10 tablet, Rfl: 0  Allergies Statins; Zithromax [azithromycin]; and Sulfur  Family History  Problem Relation Age of Onset  . Breast cancer Mother 81  . Atrial fibrillation Mother   . Breast cancer Sister 21  . Heart attack Father   . Colon cancer Brother   . Lung cancer Brother     Social History Social History   Tobacco Use  . Smoking status: Current Every Day Smoker    Packs/day: 1.00    Years: 45.00    Pack years: 45.00    Types: Cigarettes  . Smokeless tobacco: Never  Used  Substance Use Topics  . Alcohol use: No  . Drug use: No    Review of Systems Constitutional: No fever Cardiovascular: Denies chest pain. Respiratory: Denies shortness of breath. Gastrointestinal: No abdominal pain.   Musculoskeletal: Positive right arm pain. Skin: Negative for rash. Neurological: Negative for headaches, focal weakness or numbness.   ____________________________________________   PHYSICAL EXAM:  VITAL SIGNS: ED Triage Vitals  Enc Vitals Group     BP 10/03/18 1637 107/60     Pulse Rate 10/03/18 1637 66     Resp 10/03/18 1637 18     Temp 10/03/18 1637 99.1 F (37.3 C)     Temp Source 10/03/18 1637 Oral     SpO2 10/03/18 1637 96 %     Weight 10/03/18 1638 140 lb (63.5 kg)     Height 10/03/18 1638 5\' 3"  (1.6 m)     Head Circumference --      Peak Flow --      Pain Score 10/03/18 1637 10     Pain Loc --      Pain Edu? --      Excl. in [place]? --     Constitutional: Alert and oriented. Well appearing and in no acute distress. ENT      Head: Normocephalic and atraumatic. Cardiovascular: Normal rate, regular rhythm. Grossly normal heart sounds.  Good peripheral circulation. Respiratory: Normal respiratory effort without tachypnea nor retractions. Breath sounds are clear and equal bilaterally. No wheezes, rales, rhonchi. Gastrointestinal: Soft and nontender. Musculoskeletal:  No midline cervical, thoracic or lumbar tenderness to palpation. Bilateral distal radial pulses equal and easily palpated.  Bilateral hand grip strong, left greater than right.  Steady gait.  No rib torso tenderness.   Except: Tenderness noted to distal humerus and diffusely to right forearm, no pain to right hand, significant pain to right proximal elbow, able to flex and extend at wrist as well as abducted shoulder, unable to fully extend right elbow and unable to pronate supinate right hand. Neurologic:  Normal speech and language. No gross focal neurologic deficits are appreciated.  Speech is normal. No gait instability.  Skin:  Skin is warm, dry and intact. No rash noted. Psychiatric: Mood and affect are normal. Speech and behavior are normal. Patient exhibits appropriate insight and judgment   ___________________________________________   LABS (all labs ordered are listed, but only abnormal results are displayed)  Labs Reviewed - No data to display ____________________________________________  RADIOLOGY  Dg Elbow Complete Right  Result Date: 10/03/2018 CLINICAL DATA:  Right elbow pain following a fall today. EXAM: RIGHT ELBOW - COMPLETE 3+ VIEW COMPARISON:  Right humerus and forearm radiographs obtained at the same time. FINDINGS: An elbow joint effusion is demonstrated. No fracture or dislocation seen. IMPRESSION: Elbow joint effusion, most likely due to an occult radial  head fracture. Electronically Signed   By: Claudie Revering M.D.   On: 10/03/2018 18:33   Dg Forearm Right  Result Date: 10/03/2018 CLINICAL DATA:  Right forearm pain following a fall today. EXAM: RIGHT FOREARM - 2 VIEW COMPARISON:  Right elbow radiographs obtained at the same time. FINDINGS: An elbow joint effusion is demonstrated. Questionable minimal cortical irregularity of the radial head. Otherwise, no fracture or dislocation seen. IMPRESSION: Elbow joint effusion, most CLINICAL DATA:  Right forearm pain following a fall today. EXAM: RIGHT FOREARM - 2 VIEW COMPARISON:  Right elbow radiographs obtained at the same time. FINDINGS: An elbow joint effusion is demonstrated. Questionable minimal cortical irregularity of the radial head. Otherwise, no fracture or dislocation seen. IMPRESSION: Elbow joint effusion, most likely due to a nondisplaced radial head fracture. Electronically Signed   By: Claudie Revering M.D.   On: 10/03/2018 18:32   Dg Humerus Right  Result Date: 10/03/2018 CLINICAL DATA:  Right arm pain following a fall. EXAM: RIGHT HUMERUS - 2+ VIEW COMPARISON:  Right elbow obtained today. FINDINGS:  There is no evidence of fracture or other focal bone lesions. Soft tissues are unremarkable. IMPRESSION: No fracture. Electronically Signed   By: Claudie Revering M.D.   On: 10/03/2018 18:30   ____________________________________________   PROCEDURES Procedures  Right arm posterior OCL splint applied by RN and sling given INITIAL IMPRESSION / [place] / ED COURSE  Pertinent labs & imaging results that were available during my care of the patient were reviewed by me and considered in my medical decision making (see chart for details).  Well-appearing patient.  No acute distress.  Right arm pain post mechanical fall that occurred prior to arrival.  Pain mostly at right forearm and elbow.  X-rays obtained as above, per radiologist right elbow joint effusion most likely due to a nondisplaced radial head fracture.  Discussed this with patient.  OCL splint posterior long-arm applied and sling given.  Recommend orthopedic follow-up in 3 to 4 days.  Information given.  Ice, elevate and supportive care.  PRN Percocet as needed for breakthrough pain.Discussed indication, risks and benefits of medications with patient.  Discussed follow up with Primary care physician this week. Discussed follow up and return parameters including no resolution or any worsening concerns. Patient verbalized understanding and agreed to plan.   [place] controlled substance database reviewed, no recent controlled substances noted to interfere with prescription.  ____________________________________________   FINAL CLINICAL IMPRESSION(S) / ED DIAGNOSES  Final diagnoses:  Right arm pain  Effusion, right elbow  Closed nondisplaced fracture of head of right radius, initial encounter     ED Discharge Orders         Ordered    oxyCODONE-acetaminophen (PERCOCET/ROXICET) 5-325 MG tablet  Every 6 hours PRN     10/03/18 1857           Note: This dictation was prepared with Dragon dictation along with  smaller phrase technology. Any transcriptional errors that result from this process are unintentional.         Marylene Land, NP 10/03/18 1942

## 2018-10-03 NOTE — Discharge Instructions (Signed)
Take medication as prescribed. Rest. Drink plenty of fluids. Ice. Elevate.   Follow-up with orthopedic in 3 to 4 days.  See above to call to schedule.  Follow up with your primary care physician this week as needed. Return to Urgent care for new or worsening concerns.

## 2018-10-03 NOTE — ED Triage Notes (Signed)
Patient states she was hanging curtains and the chair she was standing on fell over injuring her right arm

## 2018-10-05 ENCOUNTER — Ambulatory Visit (INDEPENDENT_AMBULATORY_CARE_PROVIDER_SITE_OTHER): Payer: Medicare Other

## 2018-10-05 ENCOUNTER — Ambulatory Visit
Admission: EM | Admit: 2018-10-05 | Discharge: 2018-10-05 | Disposition: A | Discharge status: Home / Self Care | Payer: Medicare Other | Attending: Urgent Care | Admitting: Urgent Care

## 2018-10-05 ENCOUNTER — Other Ambulatory Visit: Payer: Self-pay

## 2018-10-05 DIAGNOSIS — S20211A Contusion of right front wall of thorax, initial encounter: Secondary | ICD-10-CM

## 2018-10-05 DIAGNOSIS — W07XXXA Fall from chair, initial encounter: Secondary | ICD-10-CM | POA: Diagnosis not present

## 2018-10-05 DIAGNOSIS — R0789 Other chest pain: Secondary | ICD-10-CM

## 2018-10-05 NOTE — ED Provider Notes (Signed)
[address] [place], [place] [address] [phone]   Name: Molly Matthews DOB: 1951-04-26 MRN: [id number] CSN: [id number] PCP: [name], MD  Arrival date and time:  10/05/18 1001  Chief Complaint:  Chest Pain (Ribs)  NOTE: Prior to seeing the patient today, I have reviewed the triage nursing documentation and vital signs. Clinical staff has updated patient's PMH/PSHx, current medication list, and drug allergies/intolerances to ensure comprehensive history available to assist in medical decision making.   History:   HPI: Molly Matthews is a 68 y.o. female who presents today with complaints of pain in ribs on the RIGHT side. Pain started with acute onset last night (10/04/2018) when patient got OOB to go to the bathroom. Patient denies associated shortness of breath. She states, "it hurt so bad that I could not even sit on the commode". She notes that she was unable to sleep due to the pain in her ribs. Patient denies any bruising or crepitus. Her husband placed an ACE wrap around her upper torso to help splint the pain, which patient notes helps when it is first applied, but becomes irritating as it rolls down. Pain is exacerbated by movement and deep inspiration.   Patient presents to clinic today with a long-arm posterior OCL and sling in place. Patient is status post a traumatic fall that occurred on 10/03/2018. She notes that she was standing in a chair hanging curtains when she feel directly onto her RIGHT side. Patient was seen here at [facility] following her fall and diagnosed with a closed non-displaced radial head fracture with an associated elbow effusion. Patient was seen by Sabra Heck, NP in clinic. Patient states, "She asked me if I was hurting anywhere else, and I told her no, but I did have a little pain in my ribs then". Of note, patient is scheduled to see orthopedics Mikle Bosworth, [place]) on 10/10/2018 to discuss further management of the fracture in her RIGHT upper  extremity. Patient taking Percocet 5/325 mg for her pain.   Past Medical History:  Diagnosis Date   Anxiety    Arthritis    Cancer ([place]) 01/2017   Skin cancer on right leg   Carotid artery stenosis    Carotid stenosis, left    100 % blockage   COPD (chronic obstructive pulmonary disease) (HCC)    mild   Depression    Diverticulosis    Esophagitis    Fatty liver disease, nonalcoholic    GERD (gastroesophageal reflux disease)    Herniated lumbar intervertebral disc    Hyperlipidemia    IBS (irritable bowel syndrome)    Osteopenia    Peptic ulcer disease    Personal history of tobacco use, presenting hazards to health 03/14/2016   Stroke ([place])    TIA's X 2   Vitamin D deficiency     Past Surgical History:  Procedure Laterality Date   ABDOMINAL HYSTERECTOMY     BREAST BIOPSY Left    negative 2008   CHOLECYSTECTOMY     DILATION AND CURETTAGE OF UTERUS     ESOPHAGOGASTRODUODENOSCOPY (EGD) WITH PROPOFOL N/A 01/26/2017   Procedure: ESOPHAGOGASTRODUODENOSCOPY (EGD) WITH PROPOFOL;  Surgeon: Toledo, Benay Pike, MD;  Location: [facility] ENDOSCOPY;  Service: Gastroenterology;  Laterality: N/A;   HEMORRHOID SURGERY N/A 07/25/2016   Procedure: HEMORRHOIDECTOMY INTERNAL AND EXTERNAL;  Surgeon: Leonie Green, MD;  Location: [facility] ORS;  Service: General;  Laterality: N/A;   VASCULAR SURGERY Right    Carotid Endarterectomy     Family History  Problem Relation Age of Onset   Breast cancer Mother 40   Atrial fibrillation Mother    Breast cancer Sister 44   Heart attack Father    Colon cancer Brother    Lung cancer Brother     Social History   Socioeconomic History   Marital status: Married    Spouse name: Not on file   Number of children: Not on file   Years of education: Not on file   Highest education level: Not on file  Occupational History   Not on file  Social Needs   Financial resource strain: Not on file   Food insecurity:     Worry: Not on file    Inability: Not on file   Transportation needs:    Medical: Not on file    Non-medical: Not on file  Tobacco Use   Smoking status: Current Every Day Smoker    Packs/day: 1.00    Years: 45.00    Pack years: 45.00    Types: Cigarettes   Smokeless tobacco: Never Used  Substance and Sexual Activity   Alcohol use: No   Drug use: No   Sexual activity: Yes    Birth control/protection: Surgical    Comment: Hysterectomy  Lifestyle   Physical activity:    Days per week: Not on file    Minutes per session: Not on file   Stress: Not on file  Relationships   Social connections:    Talks on phone: Not on file    Gets together: Not on file    Attends religious service: Not on file    Active member of club or organization: Not on file    Attends meetings of clubs or organizations: Not on file    Relationship status: Not on file   Intimate partner violence:    Fear of current or ex partner: Not on file    Emotionally abused: Not on file    Physically abused: Not on file    Forced sexual activity: Not on file  Other Topics Concern   Not on file  Social History Narrative   Not on file    Patient Active Problem List   Diagnosis Date Noted   Carotid stenosis 08/24/2016   PAD (peripheral artery disease) ([place]) 08/24/2016   COPD mixed type ([place]) 08/24/2016   Hyperlipidemia 08/24/2016   Personal history of tobacco use, presenting hazards to health 03/14/2016    Home Medications:    Current Meds  Medication Sig   albuterol (PROVENTIL HFA;VENTOLIN HFA) 108 (90 Base) MCG/ACT inhaler Inhale 2 puffs into the lungs every 6 (six) hours as needed for wheezing or shortness of breath.   clopidogrel (PLAVIX) 75 MG tablet Take 75 mg by mouth daily.   fluticasone (FLONASE) 50 MCG/ACT nasal spray Place 2 sprays into the nose as needed.    oxyCODONE-acetaminophen (PERCOCET/ROXICET) 5-325 MG tablet Take 1 tablet by mouth every 6 (six) hours as needed for  severe pain. Do not drive while taking as can cause drowsiness.   pantoprazole (PROTONIX) 40 MG tablet Take 40 mg by mouth every morning.    rosuvastatin (CRESTOR) 5 MG tablet Take 1/2  tab by mouth every other day M, W, F    Allergies:   Statins; Zithromax [azithromycin]; and Sulfur  Review of Systems (ROS): Review of Systems  Constitutional: Positive for activity change (decreased s/p fall 2/2 acute pain). Negative for chills and fever.  Respiratory: Negative for cough and shortness of breath.   Cardiovascular: Negative  for chest pain and palpitations.       Acute pain in RIGHT ribs  Musculoskeletal: Negative for back pain and neck pain.       Pain and swelling in RIGHT upper extremity; OCL in place s/p fall resulting in acute radial head fracture with elbow effusion diagnosed on 10/03/2018.  Skin: Negative.   Psychiatric/Behavioral: The patient is nervous/anxious.      Physical Exam:  Triage Vital Signs ED Triage Vitals  Enc Vitals Group     BP 10/05/18 1011 117/71     Pulse Rate 10/05/18 1011 88     Resp 10/05/18 1011 18     Temp 10/05/18 1011 98.3 F (36.8 C)     Temp Source 10/05/18 1011 Oral     SpO2 10/05/18 1011 93 %     Weight 10/05/18 1009 140 lb (63.5 kg)     Height 10/05/18 1009 5\' 3"  (1.6 m)     Head Circumference --      Peak Flow --      Pain Score 10/05/18 1009 10     Pain Loc --      Pain Edu? --      Excl. in [place]? --     Physical Exam  Constitutional: She is oriented to person, place, and time and well-developed, well-nourished, and in no distress.  HENT:  Head: Normocephalic and atraumatic.  Mouth/Throat: Oropharynx is clear and moist and mucous membranes are normal.  Eyes: Pupils are equal, round, and reactive to light. EOM are normal.  Cardiovascular: Normal rate, regular rhythm, normal heart sounds and intact distal pulses. Exam reveals no gallop and no friction rub.  No murmur heard. Pulmonary/Chest: Effort normal and breath sounds normal. No  respiratory distress. She has no wheezes. She has no rales. She exhibits tenderness (RIGHT lateral (ribs); minimal tenderness with palpation. Patient splinting with movement; "there is a catch in there when I move".). She exhibits no laceration, no crepitus, no deformity and no swelling.  No ecchymosis noted overlying anterior/lateral RIGHT chest wall. Increased pain with deep inspiration; attempts shallow breathing on exam. (+) splinting.   Musculoskeletal:     Right elbow: She exhibits decreased range of motion, swelling and effusion. Tenderness found. Radial head tenderness noted.     Comments: Long arm posterior OCL in place; extremity supported by a sling. No numbness or distal paraesthesias reported by the patient. Hand is warm and dry; color and capillary refill normal. Pulses intact and equal when compared contralaterally.   Neurological: She is alert and oriented to person, place, and time.  Skin: Skin is warm and dry. No rash noted. No erythema.  Psychiatric: Mood, affect and judgment normal.  Nursing note and vitals reviewed.    Urgent Care Treatments / Results:   LABS: PLEASE NOTE: all labs that were ordered this encounter are listed, however only abnormal results are displayed. Labs Reviewed - No data to display  EKG: -None  RADIOLOGY: Dg Ribs Unilateral W/chest Right  Result Date: 10/05/2018 CLINICAL DATA:  Pain following recent fall EXAM: RIGHT RIBS AND CHEST - 3+ VIEW COMPARISON:  October 07, 2017 chest and rib; chest CT April 10, 2018 FINDINGS: Frontal chest as well as oblique and cone-down rib images were obtained. Lungs are mildly hyperexpanded. No edema or consolidation. Heart size and pulmonary vascularity are normal. No adenopathy. There is aortic atherosclerosis. No evident pneumothorax or pleural effusion. No evident rib fracture. IMPRESSION: No demonstrable rib fracture. Lungs mildly hyperexpanded without edema or consolidation. Heart  size normal. Aortic Atherosclerosis  (ICD10-I70.0). Electronically Signed   By: Lowella Grip III M.D.   On: 10/05/2018 10:44    PRODEDURES: Procedures  MEDICATIONS RECEIVED THIS VISIT: Medications - No data to display  PERTINENT CLINICAL COURSE NOTES/UPDATES:   Initial Impression / Assessment and Plan / Urgent Care Course:    JEMEKA WAGLER is a 68 y.o. female who presents to [facility] today with complaints of Chest Pain (Ribs)  Pertinent labs & imaging results that were available during my care of the patient were personally reviewed by me and considered in my medical decision making (see lab/imaging section of note for values and interpretations).  Exam reassuring. Minimal tenderness with palpation over RIGHT ribcage. Pain exacerbated by movement and deep inspiration.  Diagnostic plain films of the chest and right ribs revealed no acute fractures.  Presenting symptoms and exam consistent with rib contusion.  Patient notes some improvement in her pain when her husband wrapped the ACE bandage around her chest wall.  Patient notes discomfort as wrap "rolls down".  Discussed use of an actual rib belt to help splint her pain, as rib contusions can be very painful.  Patient in agreement.  Discussed and demonstrated splinting techniques with position changes and coughing.  Reviewed deep breathing, pulmonary hygiene, and maintaining physical activity at length. Patient is an everyday smoker.  6 we discussed that pain could cause her not to take deep breaths and move around as she would normally. This unfortunately increases her risk for  developing a pneumonia.  Patient encouraged to utilize the Percocet that she has on hand for pain; #10 dispensed on 10/04/2018.  Patient is scheduled to see orthopedics on 10/10/2018 to discuss further management of her acute radial head fracture. No acute changes notes from previously documented assessment performed on 10/03/2018. Splint intact and properly placed at time of clinic visit today.  Patient exhibits grossly intact neurovascular status in her RUE. She was advised to keep long-arm posterior OCL in place and to utilize sling as previously prescribed.  May continue to use ice to help with the swelling.  I have reviewed the follow up and strict return precautions for any new or worsening symptoms. Patient is aware of symptoms that would be deemed urgent/emergent, and would thus require further evaluation either here or in the emergency department. At the time of discharge, she verbalized understanding and consent with the discharge plan as it was reviewed with her. All questions were fielded by provider and/or clinic staff prior to patient discharge.    Final Clinical Impressions(s) / Urgent Care Diagnoses:   Final diagnoses:  Contusion of rib on right side, initial encounter    New Prescriptions:  No orders of the defined types were placed in this encounter.   Controlled Substance Prescriptions:  [place] Controlled Substance Registry consulted? Yes - I have consulted the [place] Controlled Substances Registry for this patient.  Patient filled prescription for Percocet 5/325 mg (#10) on 10/04/2018.  No further indication for additional controlled substance prescription at this time.  NOTE: This note was prepared using Lobbyist along with smaller Company secretary. Despite my best ability to proofread, there is the potential that transcriptional errors may still occur from this process, and are completely unintentional.     Karen Kitchens, NP 10/06/18 1422

## 2018-10-05 NOTE — Discharge Instructions (Addendum)
It was very nice meeting you today in clinic. Thank you for entrusting me with your care.   As discussed, your ribs are not broken. Based on you pain, I would call them bruised, which can be VERY painful. Use the pain medications that you already have. Make efforts to take deep breaths every so often and to move around to prevent yourself from developing a pneumonia. You may wear the rib belt to help splint your pain.   Follow up with orthopedics as already advised for your arm. Follow up with your PCP regarding your rib pain if not improving in the next week. If your symptoms/condition worsens, please seek follow up care either here or in the ER. Please remember, our [place] providers are "right here with you" when you need [place].   Again, it was my pleasure to take care of you today. Thank you for choosing our clinic. I hope that you start to feel better quickly.   Honor Loh, MSN, APRN, FNP-C, CEN Advanced Practice Provider [facility]

## 2018-10-05 NOTE — ED Triage Notes (Signed)
Patient complains of right side rib pain that she noticed last night when she got up to go to the bathroom. Patient states that pain is worse with moving.

## 2018-12-02 ENCOUNTER — Other Ambulatory Visit: Payer: Self-pay | Admitting: Family Medicine

## 2018-12-02 DIAGNOSIS — Z1231 Encounter for screening mammogram for malignant neoplasm of breast: Secondary | ICD-10-CM

## 2019-01-07 ENCOUNTER — Ambulatory Visit
Admission: RE | Admit: 2019-01-07 | Discharge: 2019-01-07 | Disposition: A | Discharge status: Home / Self Care | Payer: Medicare Other | Source: Ambulatory Visit | Attending: Family Medicine | Admitting: Family Medicine

## 2019-01-07 DIAGNOSIS — Z1231 Encounter for screening mammogram for malignant neoplasm of breast: Secondary | ICD-10-CM | POA: Diagnosis present

## 2019-01-19 DIAGNOSIS — R490 Dysphonia: Secondary | ICD-10-CM | POA: Insufficient documentation

## 2019-01-23 ENCOUNTER — Other Ambulatory Visit: Payer: Self-pay

## 2019-01-23 ENCOUNTER — Encounter
Admission: RE | Admit: 2019-01-23 | Discharge: 2019-01-23 | Disposition: A | Discharge status: Home / Self Care | Payer: Medicare Other | Source: Ambulatory Visit | Attending: Otolaryngology | Admitting: Otolaryngology

## 2019-01-23 HISTORY — DX: Headache, unspecified: R51.9

## 2019-01-23 NOTE — Patient Instructions (Signed)
Your procedure is scheduled on: Wed 9/30 Report to Day Surgery. To find out your arrival time please call [phone] between 1PM - 3PM on Tues 9/29.  Remember: Instructions that are not followed completely may result in serious medical risk,  up to and including death, or upon the discretion of your surgeon and anesthesiologist your  surgery may need to be rescheduled.     _X__ 1. Do not eat food after midnight the night before your procedure.                 No gum chewing or hard candies. You may drink clear liquids up to 2 hours                 before you are scheduled to arrive for your surgery- DO not drink clear                 liquids within 2 hours of the start of your surgery.                 Clear Liquids include:  water, apple juice without pulp, clear carbohydrate                 drink such as Clearfast of Gatorade, Black Coffee or Tea (Do not add                 anything to coffee or tea).  __X__2.  On the morning of surgery brush your teeth with toothpaste and water, you                may rinse your mouth with mouthwash if you wish.  Do not swallow any toothpaste of mouthwash.     _X__ 3.  No Alcohol for 24 hours before or after surgery.   _X__ 4.  Do Not Smoke or use e-cigarettes For 24 Hours Prior to Your Surgery.                 Do not use any chewable tobacco products for at least 6 hours prior to                 surgery.  ____  5.  Bring all medications with you on the day of surgery if instructed.   __x__  6.  Notify your doctor if there is any change in your medical condition      (cold, fever, infections).     Do not wear jewelry, make-up, hairpins, clips or nail polish. Do not wear lotions, powders, or perfumes. You may wear deodorant. Do not shave 48 hours prior to surgery. Men may shave face and neck. Do not bring valuables to the hospital.    [facility] is not responsible for any belongings or valuables.  Contacts, dentures  or bridgework may not be worn into surgery. Leave your suitcase in the car. After surgery it may be brought to your room. For patients admitted to the hospital, discharge time is determined by your treatment team.   Patients discharged the day of surgery will not be allowed to drive home.   Please read over the following fact sheets that you were given:    __x__ Take these medicines the morning of surgery with A SIP OF WATER:    1. pantoprazole (PROTONIX) 40 MG tablet  Take dose the night before and one the morning of surgery  2.   3.   4.  5.  6.  ____ Fleet Enema (as directed)  ____ Use CHG Soap as directed  _x___ Use inhalers on the day of surgeryalbuterol (PROVENTIL HFA;VENTOLIN HFA) 108 (90 Base) MCG/ACT inhaler and bring it with you  ____ Stop metformin 2 days prior to surgery    ____ Take 1/2 of usual insulin dose the night before surgery. No insulin the morning          of surgery.   __x__ Stopped Plavix on 9/20  ____ Stop Anti-inflammatories on    ____ Stop supplements until after surgery.    ____ Bring C-Pap to the hospital.

## 2019-01-24 ENCOUNTER — Other Ambulatory Visit
Admission: RE | Admit: 2019-01-24 | Discharge: 2019-01-24 | Disposition: A | Discharge status: Home / Self Care | Payer: Medicare Other | Source: Ambulatory Visit | Attending: Otolaryngology | Admitting: Otolaryngology

## 2019-01-24 ENCOUNTER — Other Ambulatory Visit: Admission: RE | Admit: 2019-01-24 | Payer: Medicare Other | Source: Ambulatory Visit

## 2019-01-24 DIAGNOSIS — Z01812 Encounter for preprocedural laboratory examination: Secondary | ICD-10-CM | POA: Insufficient documentation

## 2019-01-24 DIAGNOSIS — Z20828 Contact with and (suspected) exposure to other viral communicable diseases: Secondary | ICD-10-CM | POA: Diagnosis not present

## 2019-01-24 LAB — SARS CORONAVIRUS 2 (TAT 6-24 HRS): SARS Coronavirus 2: NEGATIVE

## 2019-01-28 ENCOUNTER — Encounter: Payer: Self-pay | Admitting: Anesthesiology

## 2019-01-29 ENCOUNTER — Encounter: Payer: Self-pay | Admitting: *Deleted

## 2019-01-29 ENCOUNTER — Encounter
Admission: RE | Disposition: A | Discharge status: Home / Self Care | Payer: Self-pay | Source: Home / Self Care | Attending: Otolaryngology

## 2019-01-29 ENCOUNTER — Ambulatory Visit
Admission: RE | Admit: 2019-01-29 | Discharge: 2019-01-29 | Disposition: A | Discharge status: Home / Self Care | Payer: Medicare Other | Attending: Otolaryngology | Admitting: Otolaryngology

## 2019-01-29 ENCOUNTER — Ambulatory Visit: Payer: Medicare Other | Admitting: Anesthesiology

## 2019-01-29 ENCOUNTER — Other Ambulatory Visit: Payer: Self-pay

## 2019-01-29 DIAGNOSIS — J449 Chronic obstructive pulmonary disease, unspecified: Secondary | ICD-10-CM | POA: Diagnosis not present

## 2019-01-29 DIAGNOSIS — M8580 Other specified disorders of bone density and structure, unspecified site: Secondary | ICD-10-CM | POA: Diagnosis not present

## 2019-01-29 DIAGNOSIS — K760 Fatty (change of) liver, not elsewhere classified: Secondary | ICD-10-CM | POA: Diagnosis not present

## 2019-01-29 DIAGNOSIS — Z86010 Personal history of colonic polyps: Secondary | ICD-10-CM | POA: Diagnosis not present

## 2019-01-29 DIAGNOSIS — Z888 Allergy status to other drugs, medicaments and biological substances status: Secondary | ICD-10-CM | POA: Insufficient documentation

## 2019-01-29 DIAGNOSIS — Z8249 Family history of ischemic heart disease and other diseases of the circulatory system: Secondary | ICD-10-CM | POA: Diagnosis not present

## 2019-01-29 DIAGNOSIS — K279 Peptic ulcer, site unspecified, unspecified as acute or chronic, without hemorrhage or perforation: Secondary | ICD-10-CM | POA: Diagnosis not present

## 2019-01-29 DIAGNOSIS — J381 Polyp of vocal cord and larynx: Secondary | ICD-10-CM | POA: Diagnosis not present

## 2019-01-29 DIAGNOSIS — Z8673 Personal history of transient ischemic attack (TIA), and cerebral infarction without residual deficits: Secondary | ICD-10-CM | POA: Insufficient documentation

## 2019-01-29 DIAGNOSIS — Z85828 Personal history of other malignant neoplasm of skin: Secondary | ICD-10-CM | POA: Insufficient documentation

## 2019-01-29 DIAGNOSIS — I739 Peripheral vascular disease, unspecified: Secondary | ICD-10-CM | POA: Diagnosis not present

## 2019-01-29 DIAGNOSIS — Z881 Allergy status to other antibiotic agents status: Secondary | ICD-10-CM | POA: Diagnosis not present

## 2019-01-29 DIAGNOSIS — Z833 Family history of diabetes mellitus: Secondary | ICD-10-CM | POA: Insufficient documentation

## 2019-01-29 DIAGNOSIS — Z809 Family history of malignant neoplasm, unspecified: Secondary | ICD-10-CM | POA: Insufficient documentation

## 2019-01-29 DIAGNOSIS — K219 Gastro-esophageal reflux disease without esophagitis: Secondary | ICD-10-CM | POA: Insufficient documentation

## 2019-01-29 DIAGNOSIS — Z818 Family history of other mental and behavioral disorders: Secondary | ICD-10-CM | POA: Insufficient documentation

## 2019-01-29 DIAGNOSIS — M1990 Unspecified osteoarthritis, unspecified site: Secondary | ICD-10-CM | POA: Insufficient documentation

## 2019-01-29 DIAGNOSIS — F329 Major depressive disorder, single episode, unspecified: Secondary | ICD-10-CM | POA: Insufficient documentation

## 2019-01-29 DIAGNOSIS — F419 Anxiety disorder, unspecified: Secondary | ICD-10-CM | POA: Insufficient documentation

## 2019-01-29 DIAGNOSIS — Z79899 Other long term (current) drug therapy: Secondary | ICD-10-CM | POA: Diagnosis not present

## 2019-01-29 DIAGNOSIS — J387 Other diseases of larynx: Secondary | ICD-10-CM | POA: Diagnosis not present

## 2019-01-29 DIAGNOSIS — R51 Headache: Secondary | ICD-10-CM | POA: Diagnosis not present

## 2019-01-29 DIAGNOSIS — Z7951 Long term (current) use of inhaled steroids: Secondary | ICD-10-CM | POA: Diagnosis not present

## 2019-01-29 DIAGNOSIS — Z87891 Personal history of nicotine dependence: Secondary | ICD-10-CM | POA: Insufficient documentation

## 2019-01-29 DIAGNOSIS — E785 Hyperlipidemia, unspecified: Secondary | ICD-10-CM | POA: Insufficient documentation

## 2019-01-29 DIAGNOSIS — Z820 Family history of epilepsy and other diseases of the nervous system: Secondary | ICD-10-CM | POA: Insufficient documentation

## 2019-01-29 DIAGNOSIS — I2510 Atherosclerotic heart disease of native coronary artery without angina pectoris: Secondary | ICD-10-CM | POA: Insufficient documentation

## 2019-01-29 HISTORY — PX: MICROLARYNGOSCOPY: SHX5208

## 2019-01-29 SURGERY — MICROLARYNGOSCOPY
Anesthesia: General

## 2019-01-29 MED ORDER — MIDAZOLAM HCL 2 MG/2ML IJ SOLN
INTRAMUSCULAR | Status: DC | PRN
  Administered 2019-01-29: 1 mg via INTRAVENOUS

## 2019-01-29 MED ORDER — SUGAMMADEX SODIUM 200 MG/2ML IV SOLN
INTRAVENOUS | Status: AC
  Filled 2019-01-29: qty 2

## 2019-01-29 MED ORDER — ROCURONIUM BROMIDE 50 MG/5ML IV SOLN
INTRAVENOUS | Status: AC
  Filled 2019-01-29: qty 1

## 2019-01-29 MED ORDER — LIDOCAINE HCL (CARDIAC) PF 100 MG/5ML IV SOSY
PREFILLED_SYRINGE | INTRAVENOUS | Status: DC | PRN
  Administered 2019-01-29: 100 mg via INTRAVENOUS

## 2019-01-29 MED ORDER — FENTANYL CITRATE (PF) 100 MCG/2ML IJ SOLN
25.0000 ug | INTRAMUSCULAR | Status: DC | PRN
  Administered 2019-01-29 (×4): 25 ug via INTRAVENOUS

## 2019-01-29 MED ORDER — SUCCINYLCHOLINE CHLORIDE 20 MG/ML IJ SOLN
INTRAMUSCULAR | Status: AC
  Filled 2019-01-29: qty 1

## 2019-01-29 MED ORDER — EPHEDRINE SULFATE 50 MG/ML IJ SOLN
INTRAMUSCULAR | Status: DC | PRN
  Administered 2019-01-29 (×2): 10 mg via INTRAVENOUS

## 2019-01-29 MED ORDER — ONDANSETRON HCL 4 MG/2ML IJ SOLN
INTRAMUSCULAR | Status: AC
  Filled 2019-01-29: qty 2

## 2019-01-29 MED ORDER — FENTANYL CITRATE (PF) 100 MCG/2ML IJ SOLN
INTRAMUSCULAR | Status: AC
  Administered 2019-01-29: 12:00:00 25 ug via INTRAVENOUS
  Filled 2019-01-29: qty 2

## 2019-01-29 MED ORDER — DEXAMETHASONE SODIUM PHOSPHATE 10 MG/ML IJ SOLN
INTRAMUSCULAR | Status: DC | PRN
  Administered 2019-01-29: 10 mg via INTRAVENOUS

## 2019-01-29 MED ORDER — ROCURONIUM BROMIDE 100 MG/10ML IV SOLN
INTRAVENOUS | Status: DC | PRN
  Administered 2019-01-29 (×2): 10 mg via INTRAVENOUS
  Administered 2019-01-29: 20 mg via INTRAVENOUS

## 2019-01-29 MED ORDER — ONDANSETRON HCL 4 MG/2ML IJ SOLN: 4.0000 mg | Freq: Once | INTRAMUSCULAR | Status: DC | PRN

## 2019-01-29 MED ORDER — REMIFENTANIL HCL 1 MG IV SOLR
INTRAVENOUS | Status: DC | PRN
  Administered 2019-01-29: .2 ug/kg/min via INTRAVENOUS

## 2019-01-29 MED ORDER — REMIFENTANIL HCL 1 MG IV SOLR
INTRAVENOUS | Status: AC
  Filled 2019-01-29: qty 1000

## 2019-01-29 MED ORDER — FENTANYL CITRATE (PF) 100 MCG/2ML IJ SOLN
INTRAMUSCULAR | Status: DC | PRN
  Administered 2019-01-29 (×2): 50 ug via INTRAVENOUS

## 2019-01-29 MED ORDER — PHENYLEPHRINE HCL (PRESSORS) 10 MG/ML IV SOLN
INTRAVENOUS | Status: DC | PRN
  Administered 2019-01-29: 150 ug via INTRAVENOUS
  Administered 2019-01-29: 200 ug via INTRAVENOUS
  Administered 2019-01-29: 50 ug via INTRAVENOUS
  Administered 2019-01-29: 150 ug via INTRAVENOUS

## 2019-01-29 MED ORDER — LACTATED RINGERS IV SOLN
INTRAVENOUS | Status: DC
  Administered 2019-01-29 (×2): via INTRAVENOUS

## 2019-01-29 MED ORDER — OXYMETAZOLINE HCL 0.05 % NA SOLN
NASAL | Status: AC
  Filled 2019-01-29: qty 30

## 2019-01-29 MED ORDER — PROPOFOL 10 MG/ML IV BOLUS
INTRAVENOUS | Status: DC | PRN
  Administered 2019-01-29: 50 mg via INTRAVENOUS
  Administered 2019-01-29: 150 mg via INTRAVENOUS

## 2019-01-29 MED ORDER — MIDAZOLAM HCL 2 MG/2ML IJ SOLN
INTRAMUSCULAR | Status: AC
  Filled 2019-01-29: qty 2

## 2019-01-29 MED ORDER — DEXAMETHASONE SODIUM PHOSPHATE 10 MG/ML IJ SOLN
INTRAMUSCULAR | Status: AC
  Filled 2019-01-29: qty 1

## 2019-01-29 MED ORDER — ONDANSETRON HCL 4 MG/2ML IJ SOLN
INTRAMUSCULAR | Status: DC | PRN
  Administered 2019-01-29: 4 mg via INTRAVENOUS

## 2019-01-29 MED ORDER — SUGAMMADEX SODIUM 200 MG/2ML IV SOLN
INTRAVENOUS | Status: DC | PRN
  Administered 2019-01-29: 200 mg via INTRAVENOUS

## 2019-01-29 MED ORDER — SUCCINYLCHOLINE CHLORIDE 20 MG/ML IJ SOLN
INTRAMUSCULAR | Status: DC | PRN
  Administered 2019-01-29: 90 mg via INTRAVENOUS

## 2019-01-29 MED ORDER — LIDOCAINE HCL (PF) 2 % IJ SOLN
INTRAMUSCULAR | Status: AC
  Filled 2019-01-29: qty 10

## 2019-01-29 MED ORDER — DEXMEDETOMIDINE HCL IN NACL 200 MCG/50ML IV SOLN
INTRAVENOUS | Status: DC | PRN
  Administered 2019-01-29: 8 ug via INTRAVENOUS

## 2019-01-29 MED ORDER — FENTANYL CITRATE (PF) 100 MCG/2ML IJ SOLN
INTRAMUSCULAR | Status: AC
  Filled 2019-01-29: qty 2

## 2019-01-29 MED ORDER — HYDROCODONE-ACETAMINOPHEN 5-325 MG PO TABS: 1.0000 | ORAL_TABLET | Freq: Four times a day (QID) | ORAL | 0 refills | Status: DC | PRN

## 2019-01-29 SURGICAL SUPPLY — 16 items
COVER WAND RF STERILE (DRAPES) ×3 IMPLANT
DRSG TELFA 4X3 1S NADH ST (GAUZE/BANDAGES/DRESSINGS) ×3 IMPLANT
GLOVE BIO SURGEON STRL SZ7.5 (GLOVE) ×3 IMPLANT
GOWN STRL REUS W/ TWL LRG LVL3 (GOWN DISPOSABLE) ×2 IMPLANT
GOWN STRL REUS W/TWL LRG LVL3 (GOWN DISPOSABLE) ×4
IV SET EXTENSION MINI BORE EPI (IV SETS) ×3 IMPLANT
KIT TURNOVER KIT A (KITS) ×3 IMPLANT
LABEL OR SOLS (LABEL) ×1 IMPLANT
NDL ENDOSCOPIC URO 20G (NEEDLE) ×3 IMPLANT
NDL FILTER BLUNT 18X1 1/2 (NEEDLE) ×1 IMPLANT
NEEDLE FILTER BLUNT 18X 1/2SAF (NEEDLE) ×2
NEEDLE FILTER BLUNT 18X1 1/2 (NEEDLE) ×1 IMPLANT
PACK HEAD/NECK (MISCELLANEOUS) ×3 IMPLANT
PATTIES SURGICAL .5 X.5 (GAUZE/BANDAGES/DRESSINGS) ×3 IMPLANT
SOL ANTI-FOG 6CC FOG-OUT (MISCELLANEOUS) ×1 IMPLANT
SOL FOG-OUT ANTI-FOG 6CC (MISCELLANEOUS) ×2

## 2019-01-29 NOTE — Transfer of Care (Signed)
Immediate Anesthesia Transfer of Care Note  Patient: Molly Matthews  Procedure(s) Performed: MICROLARYNGOSCOPY with Vocal Cord polyp removal.   No laser is needed. (N/A )  Patient Location: PACU  Anesthesia Type:General  Level of Consciousness: awake  Airway & Oxygen Therapy: Patient Spontanous Breathing and Patient connected to face mask oxygen  Post-op Assessment: Report given to RN and Post -op Vital signs reviewed and stable  Post vital signs: stable  Last Vitals:  Vitals Value Taken Time  BP 141/72 01/29/19 1119  Temp 36.3 C 01/29/19 1119  Pulse 100 01/29/19 1122  Resp 18 01/29/19 1122  SpO2 100 % 01/29/19 1122  Vitals shown include unvalidated device data.  Last Pain:  Vitals:   01/29/19 1119  TempSrc:   PainSc: 0-No pain         Complications: No apparent anesthesia complications

## 2019-01-29 NOTE — Anesthesia Postprocedure Evaluation (Signed)
Anesthesia Post Note  Patient: DEOVEON SPINOLA  Procedure(s) Performed: MICROLARYNGOSCOPY with Vocal Cord polyp removal.   No laser is needed. (N/A )  Patient location during evaluation: PACU Anesthesia Type: General Level of consciousness: awake and alert Pain management: pain level controlled Vital Signs Assessment: post-procedure vital signs reviewed and stable Respiratory status: spontaneous breathing, nonlabored ventilation, respiratory function stable and patient connected to nasal cannula oxygen Cardiovascular status: blood pressure returned to baseline and stable Postop Assessment: no apparent nausea or vomiting Anesthetic complications: no     Last Vitals:  Vitals:   01/29/19 1222 01/29/19 1249  BP: (!) 120/50 (!) 121/54  Pulse: 92 91  Resp: 16 16  Temp: 36.4 C   SpO2: 95% 94%    Last Pain:  Vitals:   01/29/19 1249  TempSrc:   PainSc: [place]

## 2019-01-29 NOTE — Anesthesia Post-op Follow-up Note (Signed)
Anesthesia QCDR form completed.        

## 2019-01-29 NOTE — Op Note (Signed)
01/29/2019  10:54 AM    Otila Kluver  TQ:[id number]    Pre-Op Diagnosis:  vocal cord polyps  Post-op Diagnosis: vocal cord polyps  Procedure:  Microlaryngoscopy with excision of bilateral laryngeal polyps  Surgeon:  Riley Nearing., MD  Anesthesia:  General Endotracheal  EBL:  Minimal  Complications:  None  Findings:  Broad based polyp of left TVC. Multilobulated polyp coming out of the right FVC/ventricle region (saccular polyp) with underlying right TVC edema  Procedure: With the patient in a comfortable supine position, general endotracheal anesthesia was induced without difficulty.  At an appropriate level, the table was turned 90 degrees away from Anesthesia.  A clean preparation and draping was performed in the standard fashion.   A tooth guard was placed. Using the Dedo laryngoscope, the oropharynx, hypopharynx and larynx were carefully inspected. Visualization was limited as the larynx was anterior, so the anterior commissure scope was used. The scope was placed into position and the patient suspended with the Lewy arm.  The left cord polyp was excised using the microlaryngeal scissors, using a combination of the microscope and the 0 degree fiberoptic scope for visualization. There was a larger multilobulated more firm polyp coming from under the false cord ventricle region. This was excised with a combination of the microlaryngeal scissors and repeated debridement with cup forceps. The right TVC had some polypoid edema noted, but no formed polyp. Bleeding was minimal.  The findings were as described above.  The laryngoscope was removed.  At this point the procedure was completed.  The tooth guard was removed and dental status was intact.  The patient was returned to Anesthesia, awakened, extubated, and transferred to PACU in satisfactory condition.   Disposition: To PACU, then discharge home  Plan: Soft, bland diet, advance as tolerated. Take pain medications as  prescribed. Voice rest. Follow-up in 3 weeks.  Riley Nearing 01/29/2019 10:54 AM

## 2019-01-29 NOTE — Discharge Instructions (Signed)

## 2019-01-29 NOTE — Anesthesia Preprocedure Evaluation (Signed)
Anesthesia Evaluation  Patient identified by MRN, date of birth, ID band Patient awake    Reviewed: Allergy & Precautions, NPO status , Patient's Chart, lab work & pertinent test results, reviewed documented beta blocker date and time   Airway Mallampati: II  TM Distance: >3 FB     Dental  (+) Chipped   Pulmonary COPD, Current Smoker and Patient abstained from smoking.,           Cardiovascular + Peripheral Vascular Disease       Neuro/Psych  Headaches, PSYCHIATRIC DISORDERS Anxiety Depression CVA    GI/Hepatic PUD, GERD  Controlled,  Endo/Other    Renal/GU      Musculoskeletal  (+) Arthritis ,   Abdominal   Peds  Hematology   Anesthesia Other Findings   Reproductive/Obstetrics                             Anesthesia Physical Anesthesia Plan  ASA: III  Anesthesia Plan: General   Post-op Pain Management:    Induction: Intravenous  PONV Risk Score and Plan:   Airway Management Planned: Oral ETT  Additional Equipment:   Intra-op Plan:   Post-operative Plan:   Informed Consent: I have reviewed the patients History and Physical, chart, labs and discussed the procedure including the risks, benefits and alternatives for the proposed anesthesia with the patient or authorized representative who has indicated his/her understanding and acceptance.       Plan Discussed with: CRNA  Anesthesia Plan Comments:         Anesthesia Quick Evaluation

## 2019-01-29 NOTE — H&P (Signed)
History and physical reviewed and will be scanned in later. No change in medical status reported by the patient or family, appears stable for surgery. All questions regarding the procedure answered, and patient (or family if a child) expressed understanding of the procedure. ? ?Molly Matthews S Lilac Hoff ?@TODAY@ ?

## 2019-01-29 NOTE — Anesthesia Procedure Notes (Signed)
Procedure Name: Intubation Date/Time: 01/29/2019 9:55 AM Performed by: Lavone Orn, CRNA Pre-anesthesia Checklist: Patient identified, Emergency Drugs available, Suction available, Patient being monitored and Timeout performed Patient Re-evaluated:Patient Re-evaluated prior to induction Oxygen Delivery Method: Circle system utilized Preoxygenation: Pre-oxygenation with 100% oxygen Induction Type: IV induction Ventilation: Oral airway inserted - appropriate to patient size and Mask ventilation without difficulty Laryngoscope Size: McGraph and 3 Grade View: Grade II Tube type: Oral Tube size: 6.0 mm Number of attempts: 2 Airway Equipment and Method: Stylet and Video-laryngoscopy Placement Confirmation: ETT inserted through vocal cords under direct vision,  positive ETCO2 and breath sounds checked- equal and bilateral Dental Injury: Teeth and Oropharynx as per pre-operative assessment  Difficulty Due To: Difficult Airway- due to anterior larynx

## 2019-01-30 LAB — SURGICAL PATHOLOGY

## 2019-03-26 ENCOUNTER — Telehealth: Payer: Self-pay

## 2019-03-26 DIAGNOSIS — Z87891 Personal history of nicotine dependence: Secondary | ICD-10-CM

## 2019-03-26 DIAGNOSIS — Z122 Encounter for screening for malignant neoplasm of respiratory organs: Secondary | ICD-10-CM

## 2019-03-26 NOTE — Telephone Encounter (Signed)
Patient has been notified that annual lung cancer screening low dose CT scan is due currently or will be in near future. Confirmed that patient is within the age range of 55-77, and asymptomatic, (no signs or symptoms of lung cancer). Patient denies illness that would prevent curative treatment for lung cancer if found. Verified smoking history, (current, 48 pack year). The shared decision making visit was done 03/14/16. Patient is agreeable for CT scan being scheduled.

## 2019-03-26 NOTE — Addendum Note (Signed)
Addended by: Lieutenant Diego on: 03/26/2019 01:33 PM   Modules accepted: Orders

## 2019-03-26 NOTE — Telephone Encounter (Signed)
Left voicemail for pt to inform her that it is time for her annual lung cancer screening. Instructed pt to call back and confirm information prior to CT scan being scheduled.

## 2019-04-11 ENCOUNTER — Other Ambulatory Visit: Payer: Self-pay

## 2019-04-11 ENCOUNTER — Ambulatory Visit
Admission: RE | Admit: 2019-04-11 | Discharge: 2019-04-11 | Disposition: A | Discharge status: Home / Self Care | Payer: Medicare Other | Source: Ambulatory Visit | Attending: Oncology | Admitting: Oncology

## 2019-04-11 DIAGNOSIS — Z87891 Personal history of nicotine dependence: Secondary | ICD-10-CM | POA: Insufficient documentation

## 2019-04-11 DIAGNOSIS — Z122 Encounter for screening for malignant neoplasm of respiratory organs: Secondary | ICD-10-CM | POA: Insufficient documentation

## 2019-04-15 ENCOUNTER — Encounter: Payer: Self-pay | Admitting: *Deleted

## 2019-06-02 DEATH — deceased

## 2019-07-01 ENCOUNTER — Other Ambulatory Visit: Payer: Self-pay

## 2019-07-01 ENCOUNTER — Encounter: Payer: Self-pay | Admitting: Obstetrics and Gynecology

## 2019-07-01 ENCOUNTER — Ambulatory Visit: Payer: Medicare Other | Admitting: Obstetrics and Gynecology

## 2019-07-01 VITALS — BP 100/70 | Ht 63.0 in | Wt 153.0 lb

## 2019-07-01 DIAGNOSIS — N760 Acute vaginitis: Secondary | ICD-10-CM | POA: Diagnosis not present

## 2019-07-01 DIAGNOSIS — N7689 Other specified inflammation of vagina and vulva: Secondary | ICD-10-CM | POA: Diagnosis not present

## 2019-07-01 DIAGNOSIS — R8290 Unspecified abnormal findings in urine: Secondary | ICD-10-CM | POA: Diagnosis not present

## 2019-07-01 LAB — POCT WET PREP WITH KOH
Clue Cells Wet Prep HPF POC: NEGATIVE
KOH Prep POC: NEGATIVE
Trichomonas, UA: NEGATIVE
Yeast Wet Prep HPF POC: NEGATIVE

## 2019-07-01 NOTE — Progress Notes (Signed)
Molly Peach, MD   Chief Complaint  Patient presents with  . Vaginal Itching    discharge, no odor x 3 weeks  . Urinary Tract Infection    urine is very yellow, no burning or frequency    HPI:      [name] is a 69 y.o. No obstetric history on file. who LMP was No LMP recorded. Patient has had a hysterectomy., presents today for vaginal itching and increased d/c, no odor, for several months. Treated with monistat-3 about 2-3 wks ago without sx change. No urin sx, no pelvic pain, LBP, fevers. She uses oil of olay soap to wash vaginally, no dryer sheets, using scented wipes to clean. Hx of BV 2019 and 2020 but sx feel different.  She is sex active, using baby oil as lubricant.  Pt notes urin is yellow, no UTI sx. Drinks very little water. Not taking any vits.   Past Medical History:  Diagnosis Date  . Anxiety   . Arthritis   . Cancer ([place] [place]) 01/2017   Skin cancer on right leg  . Carotid artery stenosis   . Carotid stenosis, left    100 % blockage  . COPD (chronic obstructive pulmonary disease) ([facility])    mild  . Depression   . Diverticulosis   . Esophagitis   . Fatty liver disease, nonalcoholic   . GERD (gastroesophageal reflux disease)   . Headache   . Herniated lumbar intervertebral disc   . Hyperlipidemia   . IBS (irritable bowel syndrome)   . Osteopenia   . Peptic ulcer disease   . Personal history of tobacco use, presenting hazards to health 03/14/2016  . Stroke ([place])    TIA's X 2  . Vitamin D deficiency     Past Surgical History:  Procedure Laterality Date  . ABDOMINAL HYSTERECTOMY    . BREAST BIOPSY Left    negative 2008  . CHOLECYSTECTOMY    . COLONOSCOPY W/ POLYPECTOMY    . DILATION AND CURETTAGE OF UTERUS    . ESOPHAGOGASTRODUODENOSCOPY (EGD) WITH PROPOFOL N/A 01/26/2017   Procedure: ESOPHAGOGASTRODUODENOSCOPY (EGD) WITH PROPOFOL;  Surgeon: Toledo, Benay Pike, MD;  Location: [facility] ENDOSCOPY;  Service: Gastroenterology;  Laterality: N/A;  .  HEMORRHOID SURGERY N/A 07/25/2016   Procedure: HEMORRHOIDECTOMY INTERNAL AND EXTERNAL;  Surgeon: Leonie Green, MD;  Location: [facility] ORS;  Service: General;  Laterality: N/A;  . MICROLARYNGOSCOPY N/A 01/29/2019   Procedure: MICROLARYNGOSCOPY with Vocal Cord polyp removal.   No laser is needed.;  Surgeon: Clyde Canterbury, MD;  Location: [facility] ORS;  Service: ENT;  Laterality: N/A;  . VASCULAR SURGERY Right    Carotid Endarterectomy     Family History  Problem Relation Age of Onset  . Breast cancer Mother 54  . Atrial fibrillation Mother   . Breast cancer Sister 71  . Heart attack Father   . Colon cancer Brother   . Lung cancer Brother     Social History   Socioeconomic History  . Marital status: Married    Spouse name: Not on file  . Number of children: Not on file  . Years of education: Not on file  . Highest education level: Not on file  Occupational History  . Not on file  Tobacco Use  . Smoking status: Current Every Day Smoker    Packs/day: 0.25    Years: 45.00    Pack years: 11.25    Types: Cigarettes  . Smokeless tobacco: Never Used  Substance and  Sexual Activity  . Alcohol use: No  . Drug use: No  . Sexual activity: Yes    Birth control/protection: Surgical    Comment: Hysterectomy  Other Topics Concern  . Not on file  Social History Narrative  . Not on file   Social Determinants of Health   Financial Resource Strain:   . Difficulty of Paying Living Expenses: Not on file  Food Insecurity:   . Worried About Charity fundraiser in the Last Year: Not on file  . Ran Out of Food in the Last Year: Not on file  Transportation Needs:   . Lack of Transportation (Medical): Not on file  . Lack of Transportation (Non-Medical): Not on file  Physical Activity:   . Days of Exercise per Week: Not on file  . Minutes of Exercise per Session: Not on file  Stress:   . Feeling of Stress : Not on file  Social Connections:   . Frequency of Communication with Friends and  Family: Not on file  . Frequency of Social Gatherings with Friends and Family: Not on file  . Attends Religious Services: Not on file  . Active Member of Clubs or Organizations: Not on file  . Attends Archivist Meetings: Not on file  . Marital Status: Not on file  Intimate Partner Violence:   . Fear of Current or Ex-Partner: Not on file  . Emotionally Abused: Not on file  . Physically Abused: Not on file  . Sexually Abused: Not on file    Outpatient Medications Prior to Visit  Medication Sig Dispense Refill  . acetaminophen (TYLENOL) 500 MG tablet Take 1,000 mg by mouth every 6 (six) hours as needed.    Marland Kitchen albuterol (PROVENTIL HFA;VENTOLIN HFA) 108 (90 Base) MCG/ACT inhaler Inhale 2 puffs into the lungs every 6 (six) hours as needed for wheezing or shortness of breath.    . citalopram (CELEXA) 40 MG tablet Take 40 mg by mouth at bedtime.     . clopidogrel (PLAVIX) 75 MG tablet Take by mouth.    . rosuvastatin (CRESTOR) 5 MG tablet Take 1/2  tab by mouth every other day M, W, F    . ergocalciferol (VITAMIN D2) 1.25 MG (50000 UT) capsule Take 50,000 Units by mouth once a week. tues    . fluticasone (FLONASE) 50 MCG/ACT nasal spray Place 2 sprays into the nose as needed.     Marland Kitchen HYDROcodone-acetaminophen (NORCO/VICODIN) 5-325 MG tablet Take 1-2 tablets by mouth every 6 (six) hours as needed for moderate pain. 30 tablet 0  . pantoprazole (PROTONIX) 40 MG tablet Take 40 mg by mouth every morning.      No facility-administered medications prior to visit.      ROS:  Review of Systems  Constitutional: Negative for fever.  Gastrointestinal: Negative for blood in stool, constipation, diarrhea, nausea and vomiting.  Genitourinary: Positive for vaginal discharge. Negative for dyspareunia, dysuria, flank pain, frequency, hematuria, urgency, vaginal bleeding and vaginal pain.  Musculoskeletal: Negative for back pain.  Skin: Negative for rash.   BREAST: No symptoms   OBJECTIVE:    Vitals:  BP 100/70   Ht 5\' 3"  (1.6 m)   Wt 153 lb (69.4 kg)   BMI 27.10 kg/m   Physical Exam Vitals reviewed.  Constitutional:      Appearance: She is well-developed.  Pulmonary:     Effort: Pulmonary effort is normal.  Genitourinary:    General: Normal vulva.     Pubic Area: No rash.  Labia:        Right: Tenderness present. No rash or lesion.        Left: Tenderness present. No rash or lesion.      Vagina: Normal. No vaginal discharge, erythema or tenderness.     Uterus: Absent. Not tender.      Adnexa: Right adnexa normal and left adnexa normal.       Right: No mass or tenderness.         Left: No mass or tenderness.       Comments: ERYTHEMA/IRRITATION BILAT LABIA MINORA; NO D/C, NO ULCERATIVE LESIONS Musculoskeletal:        General: Normal range of motion.     Cervical back: Normal range of motion.  Skin:    General: Skin is warm and dry.  Neurological:     General: No focal deficit present.     Mental Status: She is alert and oriented to person, place, and time.  Psychiatric:        Mood and Affect: Mood normal.        Behavior: Behavior normal.        Thought Content: Thought content normal.        Judgment: Judgment normal.     Results: Results for orders placed or performed in visit on 07/01/19 (from the past 24 hour(s))  POCT Wet Prep with KOH     Status: Normal   Collection Time: 07/01/19  2:30 PM  Result Value Ref Range   Trichomonas, UA Negative    Clue Cells Wet Prep HPF POC neg    Epithelial Wet Prep HPF POC     Yeast Wet Prep HPF POC neg    Bacteria Wet Prep HPF POC     RBC Wet Prep HPF POC     WBC Wet Prep HPF POC     KOH Prep POC Negative Negative     Assessment/Plan: Acute vaginitis - Plan: POCT Wet Prep with KOH; Pos exam, neg wet prep. Most likely chemical irritation since no relief with monistat. Treat itch with OTC hydrocortisone crm BID prn sx. Dove sens skin soap, unscented baby wipes prn. May need to change to sens skin  detergent if sx persist. F/u prn.     Return if symptoms worsen or fail to improve.  Erico Stan B. Kemyah Buser, PA-C 07/01/2019 2:31 PM

## 2019-07-01 NOTE — Patient Instructions (Signed)
I value your feedback and entrusting us with your care. If you get a [facility] patient survey, I would appreciate you taking the time to let us know about your experience today. Thank you!  As of April 10, 2019, your lab results will be released to your MyChart immediately, before I even have a chance to see them. Please give me time to review them and contact you if there are any abnormalities. Thank you for your patience.  

## 2019-09-04 ENCOUNTER — Ambulatory Visit (INDEPENDENT_AMBULATORY_CARE_PROVIDER_SITE_OTHER): Payer: Medicare Other | Admitting: Vascular Surgery

## 2019-09-04 ENCOUNTER — Ambulatory Visit (INDEPENDENT_AMBULATORY_CARE_PROVIDER_SITE_OTHER): Payer: Medicare Other

## 2019-09-04 ENCOUNTER — Other Ambulatory Visit: Payer: Self-pay

## 2019-09-04 ENCOUNTER — Encounter (INDEPENDENT_AMBULATORY_CARE_PROVIDER_SITE_OTHER): Payer: Self-pay | Admitting: Vascular Surgery

## 2019-09-04 VITALS — BP 143/72 | HR 94 | Ht 63.0 in | Wt 154.0 lb

## 2019-09-04 DIAGNOSIS — I872 Venous insufficiency (chronic) (peripheral): Secondary | ICD-10-CM

## 2019-09-04 DIAGNOSIS — I739 Peripheral vascular disease, unspecified: Secondary | ICD-10-CM

## 2019-09-04 DIAGNOSIS — E782 Mixed hyperlipidemia: Secondary | ICD-10-CM

## 2019-09-04 DIAGNOSIS — I6523 Occlusion and stenosis of bilateral carotid arteries: Secondary | ICD-10-CM

## 2019-09-04 DIAGNOSIS — J449 Chronic obstructive pulmonary disease, unspecified: Secondary | ICD-10-CM

## 2019-09-04 DIAGNOSIS — I712 Thoracic aortic aneurysm, without rupture, unspecified: Secondary | ICD-10-CM

## 2019-09-04 NOTE — Progress Notes (Signed)
MRN : [id number]  Molly Matthews is a 69 y.o. (May 06, 1950) female who presents with chief complaint of  Chief Complaint  Patient presents with  . Follow-up    U/S Follow up  .  History of Present Illness:   The patient is seen for follow up evaluation of carotid stenosis. The carotid stenosis followed by ultrasound.   The patient denies amaurosis fugax. There is no recent history of TIA symptoms or focal motor deficits. There is no prior documented CVA.  The patient is taking enteric-coated aspirin 81 mg daily.  There is no history of migraine headaches. There is no history of seizures.  The patientis also here forfollowup and review of noninvasive studies of the lower extremities. There have been no interval changes in lower extremity symptoms. No interval shortening of the patient's claudication distance or development of rest pain symptoms. No new ulcers or wounds have occurred since the last visit.  There have been no significant changes to the patient's overall health care.  The patient has a history of coronary artery disease, no recent episodes of angina or shortness of breath. There is a history of hyperlipidemia which is being treated with a statin.   Carotid Duplex done today showsright ICA <30% (s/p right CEA) and known occlusion of the left ICA. No change compared to last study in 09/02/2018  ABI Rt=1.02and Lt=1.33(previous ABI's Rt=0.87and Lt=1.01)  Current Meds  Medication Sig  . acetaminophen (TYLENOL) 500 MG tablet Take 1,000 mg by mouth every 6 (six) hours as needed.  Marland Kitchen albuterol (PROVENTIL HFA;VENTOLIN HFA) 108 (90 Base) MCG/ACT inhaler Inhale 2 puffs into the lungs every 6 (six) hours as needed for wheezing or shortness of breath.  . citalopram (CELEXA) 40 MG tablet Take 40 mg by mouth at bedtime.   . clopidogrel (PLAVIX) 75 MG tablet Take by mouth.  . rosuvastatin (CRESTOR) 5 MG tablet Take 1/2  tab by mouth every other day M, W, F    Past  Medical History:  Diagnosis Date  . Anxiety   . Arthritis   . Cancer ([place]) 01/2017   Skin cancer on right leg  . Carotid artery stenosis   . Carotid stenosis, left    100 % blockage  . COPD (chronic obstructive pulmonary disease) (HCC)    mild  . Depression   . Diverticulosis   . Esophagitis   . Fatty liver disease, nonalcoholic   . GERD (gastroesophageal reflux disease)   . Headache   . Herniated lumbar intervertebral disc   . Hyperlipidemia   . IBS (irritable bowel syndrome)   . Osteopenia   . Peptic ulcer disease   . Personal history of tobacco use, presenting hazards to health 03/14/2016  . Stroke ([place])    TIA's X 2  . Vitamin D deficiency     Past Surgical History:  Procedure Laterality Date  . ABDOMINAL HYSTERECTOMY    . BREAST BIOPSY Left    negative 2008  . CHOLECYSTECTOMY    . COLONOSCOPY W/ POLYPECTOMY    . DILATION AND CURETTAGE OF UTERUS    . ESOPHAGOGASTRODUODENOSCOPY (EGD) WITH PROPOFOL N/A 01/26/2017   Procedure: ESOPHAGOGASTRODUODENOSCOPY (EGD) WITH PROPOFOL;  Surgeon: Toledo, Benay Pike, MD;  Location: [facility] END[facility];  Service: Gastroenterology;  Laterality: N/A;  . HEMORRHOID SURGERY N/A 07/25/2016   Procedure: HEMORRHOIDECTOMY INTERNAL AND EXTERNAL;  Surgeon: Leonie Green, MD;  Location: [facility] ORS;  Service: General;  Laterality: N/A;  . MICROLARYNGOSCOPY N/A 01/29/2019   Procedure: MICROLARYNGOSCOPY with  Vocal Cord polyp removal.   No laser is needed.;  Surgeon: Clyde Canterbury, MD;  Location: [facility] ORS;  Service: ENT;  Laterality: N/A;  . VASCULAR SURGERY Right    Carotid Endarterectomy     Social History Social History   Tobacco Use  . Smoking status: Current Every Day Smoker    Packs/day: 0.25    Years: 45.00    Pack years: 11.25    Types: Cigarettes  . Smokeless tobacco: Never Used  Substance Use Topics  . Alcohol use: No  . Drug use: No    Family History Family History  Problem Relation Age of Onset  . Breast cancer Mother 29  .  Atrial fibrillation Mother   . Breast cancer Sister 46  . Heart attack Father   . Colon cancer Brother   . Lung cancer Brother     Allergies  Allergen Reactions  . Chantix [Varenicline Tartrate] Other (See Comments)    Jittery muscle movements  . Statins Other (See Comments)    Muscle and joint aches - can take in small doses  . Zithromax [Azithromycin] Other (See Comments)    Back Pain  . Sulfur Rash     REVIEW OF SYSTEMS (Negative unless checked)  Constitutional: [] Weight loss  [] Fever  [] Chills Cardiac: [] Chest pain   [] Chest pressure   [] Palpitations   [] Shortness of breath when laying flat   [] Shortness of breath with exertion. Vascular:  [] Pain in legs with walking   [x] Pain in legs at rest  [] History of DVT   [] Phlebitis   [] Swelling in legs   [] Varicose veins   [] Non-healing ulcers Pulmonary:   [] Uses home oxygen   [] Productive cough   [] Hemoptysis   [] Wheeze  [] COPD   [] Asthma Neurologic:  [] Dizziness   [] Seizures   [] History of stroke   [] History of TIA  [] Aphasia   [] Vissual changes   [] Weakness or numbness in arm   [] Weakness or numbness in leg Musculoskeletal:   [] Joint swelling   [x] Joint pain   [x] Low back pain Hematologic:  [] Easy bruising  [] Easy bleeding   [] Hypercoagulable state   [] Anemic Gastrointestinal:  [] Diarrhea   [] Vomiting  [] Gastroesophageal reflux/heartburn   [] Difficulty swallowing. Genitourinary:  [] Chronic kidney disease   [] Difficult urination  [] Frequent urination   [] Blood in urine Skin:  [] Rashes   [] Ulcers  Psychological:  [] History of anxiety   []  History of major depression.  Physical Examination  Vitals:   09/04/19 1041  BP: (!) 143/72  Pulse: 94  Weight: 154 lb (69.9 kg)  Height: 5\' 3"  (1.6 m)   Body mass index is 27.28 kg/m. Gen: WD/WN, NAD Head: [place]/AT, No temporalis wasting.  Ear/Nose/Throat: Hearing grossly intact, nares w/o erythema or drainage Eyes: PER, EOMI, sclera nonicteric.  Neck: Supple, no large masses.     Pulmonary:  Good air movement, no audible wheezing bilaterally, no use of accessory muscles.  Cardiac: RRR, no JVD Vascular: many varicosities present bilaterally.  Moderate venous stasis changes to the legs bilaterally.  2+ soft pitting edema Vessel Right Left  Radial Palpable Palpable  Carotid Palpable Palpable  PT Not Palpable Palpable  DP Not Palpable Palpable  Gastrointestinal: Non-distended. No guarding/no peritoneal signs.  Musculoskeletal: M/S 5/5 throughout.  No deformity or atrophy.  Neurologic: CN 2-12 intact. Symmetrical.  Speech is fluent. Motor exam as listed above. Psychiatric: Judgment intact, Mood & affect appropriate for pt's clinical situation. Dermatologic: No rashes or ulcers noted.  No changes consistent with cellulitis.  CBC Lab Results  Component Value Date   WBC 10.8 (H) 04/25/2018   HGB 15.0 04/25/2018   HCT 43.5 04/25/2018   MCV 95.0 04/25/2018   PLT 228 04/25/2018    BMET    Component Value Date/Time   NA 142 04/25/2018 1713   NA 142 01/29/2014 2124   K 4.6 04/25/2018 1713   K 4.0 01/29/2014 2124   CL 108 04/25/2018 1713   CL 110 (H) 01/29/2014 2124   CO2 28 04/25/2018 1713   CO2 25 01/29/2014 2124   GLUCOSE 101 (H) 04/25/2018 1713   GLUCOSE 137 (H) 01/29/2014 2124   BUN 10 04/25/2018 1713   BUN 10 01/29/2014 2124   CREATININE 0.75 04/25/2018 1713   CREATININE 1.00 01/29/2014 2124   CALCIUM 9.8 04/25/2018 1713   CALCIUM 9.3 01/29/2014 2124   GFRNONAA >60 04/25/2018 1713   GFRNONAA 59 (L) 01/29/2014 2124   GFRNONAA >60 12/22/2013 1159   GFRAA >60 04/25/2018 1713   GFRAA >60 01/29/2014 2124   GFRAA >60 12/22/2013 1159   CrCl cannot be calculated (Patient's most recent lab result is older than the maximum 21 days allowed.).  COAG No results found for: INR, PROTIME  Radiology CT scan of the chest dated 04/11/2019 for cancer screening is reviewed by me personally and I evaluated the thoracic aneurysm.  I concur with the measurement of  12.7.  Not substantially changed from the previous scan    Assessment/Plan 1. Bilateral carotid artery stenosis Recommend:  Given the patient's asymptomatic subcritical stenosis no further invasive testing or surgery at this time.  Carotid Duplex done today showsright ICA <30% (s/p right CEA) and known occlusion of the left ICA.  Continue antiplatelet therapy as prescribed Continue management of CAD, HTN and Hyperlipidemia Healthy heart diet,  encouraged exercise at least 4 times per week Follow up in 12 months with duplex ultrasound and physical exam   - VAS US CAROTID; Future  2. PAD (peripheral artery disease) (HCC)  Recommend:  The patient has evidence of atherosclerosis of the lower extremities with claudication.  The patient does not voice lifestyle limiting changes at this point in time.  Noninvasive studies do not suggest clinically significant change.  No invasive studies, angiography or surgery at this time The patient should continue walking and begin a more formal exercise program.  The patient should continue antiplatelet therapy and aggressive treatment of the lipid abnormalities  No changes in the patient's medications at this time  The patient should continue wearing graduated compression socks 15-20 mmHg strength to control the mild edema.   - VAS [place] ABI WITH/WO TBI; Future  3. Chronic venous insufficiency No surgery or intervention at this point in time.    I have had a long discussion with the patient regarding venous insufficiency and why it  causes symptoms. I have discussed with the patient the chronic skin changes that accompany venous insufficiency and the long term sequela such as infection and ulceration.  Patient will begin wearing graduated compression stockings on a daily basis a prescription was given. The patient will put the stockings on first thing in the morning and removing them in the evening. The patient is instructed specifically not to  sleep in the stockings.    In addition, behavioral modification including several periods of elevation of the lower extremities during the day will be continued. I have demonstrated that proper elevation is a position with the ankles at heart level.  The patient is instructed to begin routine exercise, especially walking on  a daily basis  Following the review of the ultrasound the patient will follow up in 12 months to reassess the degree of swelling and the control that graduated compression stockings or compression wraps  is offering.   The patient can be assessed for a Lymph Pump at that time  4. Thoracic aortic aneurysm without rupture [facility]) The patient's most recent chest CT was performed in December 2020 (04/11/2019) just 5 months ago.  At that time the thoracic aneurysm was 2.7 cm not substantially changed since the scan from 1 year ago.  I have personally reviewed the scan from 04/11/2019 and concur with this evaluation  Given this finding no further intervention is indicated at this time.  We will continue to follow in correlation with cancer screening CTs.   5. COPD mixed type ([place]) Continue pulmonary medications and aerosols as already ordered, these medications have been reviewed and there are no changes at this time.    6. Mixed hyperlipidemia Continue antihypertensive medications as already ordered, these medications have been reviewed and there are no changes at this time.    Hortencia Pilar, MD  09/04/2019 11:10 AM

## 2019-12-05 ENCOUNTER — Other Ambulatory Visit: Payer: Self-pay | Admitting: Family Medicine

## 2019-12-05 DIAGNOSIS — Z1231 Encounter for screening mammogram for malignant neoplasm of breast: Secondary | ICD-10-CM

## 2020-01-08 ENCOUNTER — Ambulatory Visit
Admission: RE | Admit: 2020-01-08 | Discharge: 2020-01-08 | Disposition: A | Discharge status: Home / Self Care | Payer: Medicare Other | Source: Ambulatory Visit | Attending: Family Medicine | Admitting: Family Medicine

## 2020-01-08 DIAGNOSIS — Z1231 Encounter for screening mammogram for malignant neoplasm of breast: Secondary | ICD-10-CM | POA: Diagnosis present

## 2020-01-28 DIAGNOSIS — R0602 Shortness of breath: Secondary | ICD-10-CM | POA: Insufficient documentation

## 2020-04-08 ENCOUNTER — Other Ambulatory Visit: Payer: Self-pay | Admitting: *Deleted

## 2020-04-08 DIAGNOSIS — Z87891 Personal history of nicotine dependence: Secondary | ICD-10-CM

## 2020-04-08 DIAGNOSIS — Z122 Encounter for screening for malignant neoplasm of respiratory organs: Secondary | ICD-10-CM

## 2020-04-08 NOTE — Progress Notes (Signed)
Contacted and scheduled for lung screening scan. Patient is a current smoker with a 49 pack year history.

## 2020-04-16 ENCOUNTER — Ambulatory Visit
Admission: RE | Admit: 2020-04-16 | Discharge: 2020-04-16 | Disposition: A | Discharge status: Home / Self Care | Payer: Medicare Other | Source: Ambulatory Visit | Attending: Nurse Practitioner | Admitting: Nurse Practitioner

## 2020-04-16 ENCOUNTER — Other Ambulatory Visit: Payer: Self-pay

## 2020-04-16 DIAGNOSIS — Z122 Encounter for screening for malignant neoplasm of respiratory organs: Secondary | ICD-10-CM | POA: Insufficient documentation

## 2020-04-16 DIAGNOSIS — Z87891 Personal history of nicotine dependence: Secondary | ICD-10-CM | POA: Diagnosis present

## 2020-04-21 ENCOUNTER — Encounter: Payer: Self-pay | Admitting: *Deleted

## 2020-06-15 DIAGNOSIS — I2089 Other forms of angina pectoris: Secondary | ICD-10-CM | POA: Insufficient documentation

## 2020-06-15 DIAGNOSIS — I208 Other forms of angina pectoris: Secondary | ICD-10-CM | POA: Insufficient documentation

## 2020-06-24 ENCOUNTER — Other Ambulatory Visit
Admission: RE | Admit: 2020-06-24 | Discharge: 2020-06-24 | Disposition: A | Discharge status: Home / Self Care | Payer: Medicare Other | Source: Ambulatory Visit | Attending: Gastroenterology | Admitting: Gastroenterology

## 2020-06-24 ENCOUNTER — Other Ambulatory Visit: Payer: Self-pay

## 2020-06-24 DIAGNOSIS — Z20822 Contact with and (suspected) exposure to covid-19: Secondary | ICD-10-CM | POA: Diagnosis not present

## 2020-06-24 DIAGNOSIS — Z01812 Encounter for preprocedural laboratory examination: Secondary | ICD-10-CM | POA: Insufficient documentation

## 2020-06-24 LAB — SARS CORONAVIRUS 2 (TAT 6-24 HRS): SARS Coronavirus 2: NEGATIVE

## 2020-06-29 ENCOUNTER — Ambulatory Visit: Admit: 2020-06-29 | Payer: Medicare Other

## 2020-06-29 SURGERY — COLONOSCOPY
Anesthesia: General

## 2020-07-22 ENCOUNTER — Other Ambulatory Visit
Admission: RE | Admit: 2020-07-22 | Discharge: 2020-07-22 | Disposition: A | Discharge status: Home / Self Care | Payer: Medicare Other | Source: Ambulatory Visit | Attending: Internal Medicine | Admitting: Internal Medicine

## 2020-07-22 ENCOUNTER — Other Ambulatory Visit: Payer: Self-pay

## 2020-07-22 DIAGNOSIS — Z20822 Contact with and (suspected) exposure to covid-19: Secondary | ICD-10-CM | POA: Diagnosis not present

## 2020-07-22 DIAGNOSIS — Z01812 Encounter for preprocedural laboratory examination: Secondary | ICD-10-CM | POA: Insufficient documentation

## 2020-07-22 LAB — SARS CORONAVIRUS 2 (TAT 6-24 HRS): SARS Coronavirus 2: NEGATIVE

## 2020-07-23 ENCOUNTER — Encounter: Payer: Self-pay | Admitting: Internal Medicine

## 2020-07-26 ENCOUNTER — Ambulatory Visit
Admission: RE | Admit: 2020-07-26 | Discharge: 2020-07-26 | Disposition: A | Discharge status: Home / Self Care | Payer: Medicare Other | Source: Ambulatory Visit | Attending: Internal Medicine | Admitting: Internal Medicine

## 2020-07-26 ENCOUNTER — Encounter
Admission: RE | Disposition: A | Discharge status: Home / Self Care | Payer: Self-pay | Source: Ambulatory Visit | Attending: Internal Medicine

## 2020-07-26 ENCOUNTER — Ambulatory Visit: Payer: Medicare Other | Admitting: Anesthesiology

## 2020-07-26 DIAGNOSIS — Z79899 Other long term (current) drug therapy: Secondary | ICD-10-CM | POA: Diagnosis not present

## 2020-07-26 DIAGNOSIS — K219 Gastro-esophageal reflux disease without esophagitis: Secondary | ICD-10-CM | POA: Insufficient documentation

## 2020-07-26 DIAGNOSIS — K3189 Other diseases of stomach and duodenum: Secondary | ICD-10-CM | POA: Diagnosis not present

## 2020-07-26 DIAGNOSIS — R112 Nausea with vomiting, unspecified: Secondary | ICD-10-CM | POA: Insufficient documentation

## 2020-07-26 DIAGNOSIS — D123 Benign neoplasm of transverse colon: Secondary | ICD-10-CM | POA: Insufficient documentation

## 2020-07-26 DIAGNOSIS — K641 Second degree hemorrhoids: Secondary | ICD-10-CM | POA: Diagnosis not present

## 2020-07-26 DIAGNOSIS — R1013 Epigastric pain: Secondary | ICD-10-CM | POA: Diagnosis not present

## 2020-07-26 DIAGNOSIS — K621 Rectal polyp: Secondary | ICD-10-CM | POA: Insufficient documentation

## 2020-07-26 DIAGNOSIS — Z881 Allergy status to other antibiotic agents status: Secondary | ICD-10-CM | POA: Diagnosis not present

## 2020-07-26 DIAGNOSIS — Z888 Allergy status to other drugs, medicaments and biological substances status: Secondary | ICD-10-CM | POA: Insufficient documentation

## 2020-07-26 DIAGNOSIS — Z8673 Personal history of transient ischemic attack (TIA), and cerebral infarction without residual deficits: Secondary | ICD-10-CM | POA: Diagnosis not present

## 2020-07-26 DIAGNOSIS — K625 Hemorrhage of anus and rectum: Secondary | ICD-10-CM | POA: Diagnosis present

## 2020-07-26 DIAGNOSIS — Z7902 Long term (current) use of antithrombotics/antiplatelets: Secondary | ICD-10-CM | POA: Diagnosis not present

## 2020-07-26 DIAGNOSIS — R197 Diarrhea, unspecified: Secondary | ICD-10-CM | POA: Insufficient documentation

## 2020-07-26 DIAGNOSIS — Z7951 Long term (current) use of inhaled steroids: Secondary | ICD-10-CM | POA: Diagnosis not present

## 2020-07-26 HISTORY — DX: Calculus of gallbladder without cholecystitis without obstruction: K80.20

## 2020-07-26 HISTORY — DX: Occlusion and stenosis of left carotid artery: I65.22

## 2020-07-26 HISTORY — DX: Atherosclerosis of aorta: I70.0

## 2020-07-26 HISTORY — PX: COLONOSCOPY WITH PROPOFOL: SHX5780

## 2020-07-26 HISTORY — DX: Ulcer of esophagus without bleeding: K22.10

## 2020-07-26 HISTORY — PX: ESOPHAGOGASTRODUODENOSCOPY (EGD) WITH PROPOFOL: SHX5813

## 2020-07-26 HISTORY — DX: Angina pectoris, unspecified: I20.9

## 2020-07-26 HISTORY — DX: Separation of muscle (nontraumatic), other site: M62.08

## 2020-07-26 HISTORY — DX: Atherosclerotic heart disease of native coronary artery without angina pectoris: I25.10

## 2020-07-26 HISTORY — DX: Noninfective gastroenteritis and colitis, unspecified: K52.9

## 2020-07-26 HISTORY — DX: Dyspnea, unspecified: R06.00

## 2020-07-26 HISTORY — DX: Nicotine dependence, unspecified, uncomplicated: F17.200

## 2020-07-26 HISTORY — DX: Squamous cell carcinoma of skin, unspecified: C44.92

## 2020-07-26 HISTORY — DX: Gastritis, unspecified, without bleeding: K29.70

## 2020-07-26 HISTORY — DX: Polyp of colon: K63.5

## 2020-07-26 HISTORY — DX: Venous insufficiency (chronic) (peripheral): I87.2

## 2020-07-26 HISTORY — DX: Fatty (change of) liver, not elsewhere classified: K76.0

## 2020-07-26 HISTORY — DX: Hyperlipidemia, unspecified: E78.5

## 2020-07-26 SURGERY — COLONOSCOPY WITH PROPOFOL
Anesthesia: General

## 2020-07-26 MED ORDER — PHENYLEPHRINE HCL (PRESSORS) 10 MG/ML IV SOLN
INTRAVENOUS | Status: DC | PRN
  Administered 2020-07-26 (×2): 50 ug via INTRAVENOUS

## 2020-07-26 MED ORDER — PROPOFOL 500 MG/50ML IV EMUL
INTRAVENOUS | Status: AC
  Filled 2020-07-26: qty 50

## 2020-07-26 MED ORDER — PROPOFOL 10 MG/ML IV BOLUS
INTRAVENOUS | Status: DC | PRN
  Administered 2020-07-26: 80 mg via INTRAVENOUS

## 2020-07-26 MED ORDER — LIDOCAINE HCL (PF) 1 % IJ SOLN
INTRAMUSCULAR | Status: AC
  Filled 2020-07-26: qty 2

## 2020-07-26 MED ORDER — GLYCOPYRROLATE 0.2 MG/ML IJ SOLN
INTRAMUSCULAR | Status: DC | PRN
  Administered 2020-07-26: .2 mg via INTRAVENOUS

## 2020-07-26 MED ORDER — PROPOFOL 500 MG/50ML IV EMUL
INTRAVENOUS | Status: DC | PRN
  Administered 2020-07-26: 120 ug/kg/min via INTRAVENOUS

## 2020-07-26 MED ORDER — SODIUM CHLORIDE 0.9 % IV SOLN
INTRAVENOUS | Status: DC
  Administered 2020-07-26: 1000 mL via INTRAVENOUS

## 2020-07-26 NOTE — H&P (Signed)
Outpatient short stay form Pre-procedure 07/26/2020 12:00 PM Molly Matthews K. Alice Reichert, M.D.  Primary Physician: Salome Holmes, M.D.  Reason for visit:  Rectal bleeding, abdominal pain, change in bowel habits, diarrhea. Epigastric pain, nausea and vomiting  History of present illness:  70 y/o female presents with "13 weeks" of diarrhea, abdominal pain, diarrhea and rectal bleeding. Also has nausea and vomiting with meals. No significant weight loss.    Current Facility-Administered Medications:  .  0.9 %  sodium chloride infusion, , Intravenous, Continuous, Goodman, Benay Pike, MD, Last Rate: 20 mL/hr at 07/26/20 1147, 1,000 mL at 07/26/20 1147 .  lidocaine (PF) (XYLOCAINE) 1 % injection, , , ,   Medications Prior to Admission  Medication Sig Dispense Refill Last Dose  . albuterol (PROVENTIL HFA;VENTOLIN HFA) 108 (90 Base) MCG/ACT inhaler Inhale 2 puffs into the lungs every 6 (six) hours as needed for wheezing or shortness of breath.   Past Month at Unknown time  . ergocalciferol (VITAMIN D2) 1.25 MG (50000 UT) capsule Take 50,000 Units by mouth once a week.     . pantoprazole (PROTONIX) 40 MG tablet Take 40 mg by mouth daily.     Marland Kitchen umeclidinium-vilanterol (ANORO ELLIPTA) 62.5-25 MCG/INH AEPB Inhale 1 puff into the lungs daily.     Marland Kitchen acetaminophen (TYLENOL) 500 MG tablet Take 1,000 mg by mouth every 6 (six) hours as needed.     . citalopram (CELEXA) 40 MG tablet Take 40 mg by mouth at bedtime.      . clopidogrel (PLAVIX) 75 MG tablet Take by mouth.   07/20/2020  . rosuvastatin (CRESTOR) 5 MG tablet Take 1/2  tab by mouth every other day M, W, F        Allergies  Allergen Reactions  . Chantix [Varenicline Tartrate] Other (See Comments)    Jittery muscle movements  . Statins Other (See Comments)    Muscle and joint aches - can take in small doses  . Zithromax [Azithromycin] Other (See Comments)    Back Pain  . Elemental Sulfur Rash     Past Medical History:  Diagnosis Date  . Anginal  pain ([place])   . Anxiety   . Aortic atherosclerosis ([place])   . Arthritis   . Cancer ([place]) 01/2017   Skin cancer on right leg  . Carotid artery stenosis   . Carotid stenosis, left    100 % blockage  . Cholelithiasis   . Chronic enteritis   . Chronic venous insufficiency   . Colon polyps   . COPD (chronic obstructive pulmonary disease) (HCC)    mild  . Coronary artery disease   . Depression   . Diastasis recti   . Diverticulosis   . Diverticulosis   . Dyspnea   . Erosive esophagitis   . Esophagitis   . Fatty liver   . Fatty liver disease, nonalcoholic   . Gastritis   . GERD (gastroesophageal reflux disease)   . Headache   . Herniated lumbar intervertebral disc   . Herniated lumbar intervertebral disc   . Hyperlipemia   . Hyperlipidemia   . Hypertension   . IBS (irritable bowel syndrome)   . Left carotid artery stenosis   . Osteopenia   . Osteopenia   . Peptic ulcer disease   . Personal history of tobacco use, presenting hazards to health 03/14/2016  . SCC (squamous cell carcinoma)   . Stroke ([place])    TIA's X 2  . Tobacco dependence   . Vitamin D deficiency  Review of systems:  Otherwise negative.    Physical Exam  Gen: Alert, oriented. Appears stated age.  HEENT: [place]/AT. PERRLA. Lungs: CTA, no wheezes. CV: RR nl S1, S2. Abd: soft, benign, no masses. BS+ Ext: No edema. Pulses 2+    Planned procedures: Proceed with EGD and colonoscopy. The patient understands the nature of the planned procedure, indications, risks, alternatives and potential complications including but not limited to bleeding, infection, perforation, damage to internal organs and possible oversedation/side effects from anesthesia. The patient agrees and gives consent to proceed.  Please refer to procedure notes for findings, recommendations and patient disposition/instructions.     Deforest Maiden K. Alice Reichert, M.D. Gastroenterology 07/26/2020  12:00 PM

## 2020-07-26 NOTE — Anesthesia Preprocedure Evaluation (Signed)
Anesthesia Evaluation  Patient identified by MRN, date of birth, ID band Patient awake    Reviewed: Allergy & Precautions, H&P , NPO status , Patient's Chart, lab work & pertinent test results, reviewed documented beta blocker date and time   Airway Mallampati: II   Neck ROM: full    Dental  (+) Teeth Intact   Pulmonary neg pulmonary ROS, shortness of breath and with exertion, COPD,  COPD inhaler, Current Smoker and Patient abstained from smoking.,    Pulmonary exam normal        Cardiovascular Exercise Tolerance: Poor hypertension, + angina with exertion + CAD and + Peripheral Vascular Disease  negative cardio ROS Normal cardiovascular exam Rhythm:regular Rate:Normal     Neuro/Psych  Headaches, PSYCHIATRIC DISORDERS Anxiety Depression  Neuromuscular disease CVA, No Residual Symptoms negative neurological ROS  negative psych ROS   GI/Hepatic negative GI ROS, Neg liver ROS, PUD, GERD  Medicated,  Endo/Other  negative endocrine ROS  Renal/GU negative Renal ROS  negative genitourinary   Musculoskeletal   Abdominal   Peds  Hematology negative hematology ROS (+)   Anesthesia Other Findings Past Medical History: No date: Anginal pain ([place]) No date: Anxiety No date: Aortic atherosclerosis (HCC) No date: Arthritis 01/2017: Cancer ([place])     Comment:  Skin cancer on right leg No date: Carotid artery stenosis No date: Carotid stenosis, left     Comment:  100 % blockage No date: Cholelithiasis No date: Chronic enteritis No date: Chronic venous insufficiency No date: Colon polyps No date: COPD (chronic obstructive pulmonary disease) (HCC)     Comment:  mild No date: Coronary artery disease No date: Depression No date: Diastasis recti No date: Diverticulosis No date: Diverticulosis No date: Dyspnea No date: Erosive esophagitis No date: Esophagitis No date: Fatty liver No date: Fatty liver disease, nonalcoholic No  date: Gastritis No date: GERD (gastroesophageal reflux disease) No date: Headache No date: Herniated lumbar intervertebral disc No date: Herniated lumbar intervertebral disc No date: Hyperlipemia No date: Hyperlipidemia No date: Hypertension No date: IBS (irritable bowel syndrome) No date: Left carotid artery stenosis No date: Osteopenia No date: Osteopenia No date: Peptic ulcer disease 03/14/2016: Personal history of tobacco use, presenting hazards to  health No date: SCC (squamous cell carcinoma) No date: Stroke ([place])     Comment:  TIA's X 2 No date: Tobacco dependence No date: Vitamin D deficiency Past Surgical History: No date: ABDOMINAL HYSTERECTOMY No date: BREAST BIOPSY; Left     Comment:  negative 2008 No date: CAROTID ENDARTERECTOMY No date: CHOLECYSTECTOMY No date: COLONOSCOPY W/ POLYPECTOMY No date: DILATION AND CURETTAGE OF UTERUS 01/26/2017: ESOPHAGOGASTRODUODENOSCOPY (EGD) WITH PROPOFOL; N/A     Comment:  Procedure: ESOPHAGOGASTRODUODENOSCOPY (EGD) WITH               PROPOFOL;  Surgeon: Toledo, Benay Pike, MD;  Location:               [facility];  Service: Gastroenterology;  Laterality:               N/A; 07/25/2016: HEMORRHOID SURGERY; N/A     Comment:  Procedure: HEMORRHOIDECTOMY INTERNAL AND EXTERNAL;                Surgeon: Leonie Green, MD;  Location: [facility] ORS;                Service: General;  Laterality: N/A; 01/29/2019: MICROLARYNGOSCOPY; N/A     Comment:  Procedure: MICROLARYNGOSCOPY with Vocal Cord polyp  removal.   No laser is needed.;  Surgeon: Clyde Canterbury,               MD;  Location: [facility] ORS;  Service: ENT;  Laterality: N/A; No date: VASCULAR SURGERY; Right     Comment:  Carotid Endarterectomy  BMI    Body Mass Index: 26.16 kg/m     Reproductive/Obstetrics negative OB ROS                             Anesthesia Physical Anesthesia Plan  ASA: IV  Anesthesia Plan: General   Post-op Pain  Management:    Induction:   PONV Risk Score and Plan:   Airway Management Planned:   Additional Equipment:   Intra-op Plan:   Post-operative Plan:   Informed Consent: I have reviewed the patients History and Physical, chart, labs and discussed the procedure including the risks, benefits and alternatives for the proposed anesthesia with the patient or authorized representative who has indicated his/her understanding and acceptance.     Dental Advisory Given  Plan Discussed with: CRNA  Anesthesia Plan Comments:         Anesthesia Quick Evaluation

## 2020-07-26 NOTE — Interval H&P Note (Signed)
History and Physical Interval Note:  07/26/2020 12:03 PM  Molly Matthews  has presented today for surgery, with the diagnosis of N/V RECTAL BLEEDING.  The various methods of treatment have been discussed with the patient and family. After consideration of risks, benefits and other options for treatment, the patient has consented to  Procedure(s): COLONOSCOPY WITH PROPOFOL (N/A) ESOPHAGOGASTRODUODENOSCOPY (EGD) WITH PROPOFOL (N/A) as a surgical intervention.  The patient's history has been reviewed, patient examined, no change in status, stable for surgery.  I have reviewed the patient's chart and labs.  Questions were answered to the patient's satisfaction.     [place], [place]

## 2020-07-26 NOTE — Op Note (Addendum)
[facility] Gastroenterology Patient Name: Molly Matthews Procedure Date: 07/26/2020 11:55 AM MRN: [id number] Account #: [id number] Date of Birth: [birth year] Admit Type: Outpatient Age: 70 Room: [facility] ENDO ROOM 2 Gender: Female Note Status: Addendum Procedure:             Colonoscopy Indications:           Rectal bleeding, Change in bowel habits Providers:             Benay Pike. Alice Reichert MD, MD Referring MD:          Rubbie Battiest. Iona Beard MD, MD (Referring MD) Medicines:             Propofol per Anesthesia Complications:         No immediate complications. Procedure:             Pre-Anesthesia Assessment:                        - The risks and benefits of the procedure and the                         sedation options and risks were discussed with the                         patient. All questions were answered and informed                         consent was obtained.                        - Patient identification and proposed procedure were                         verified prior to the procedure by the nurse. The                         procedure was verified in the procedure room.                        - ASA Grade Assessment: III - A patient with severe                         systemic disease.                        - After reviewing the risks and benefits, the patient                         was deemed in satisfactory condition to undergo the                         procedure.                        After obtaining informed consent, the colonoscope was                         passed under direct vision. Throughout the procedure,                         the patient's  blood pressure, pulse, and oxygen                         saturations were monitored continuously. The                         Colonoscope was introduced through the anus and                         advanced to the the cecum, identified by appendiceal                         orifice and ileocecal valve.  The colonoscopy was                         performed without difficulty. The patient tolerated                         the procedure well. The quality of the bowel                         preparation was adequate. The ileocecal valve,                         appendiceal orifice, and rectum were photographed. Findings:      The perianal and digital rectal examinations were normal. Pertinent       negatives include normal sphincter tone and no palpable rectal lesions.      Non-bleeding hemorrhoids were found during retroflexion. The hemorrhoids       were Grade II (internal hemorrhoids that prolapse but reduce       spontaneously).      A 7 mm polyp was found in the rectum. The polyp was sessile. The polyp       was removed with a hot snare. Resection and retrieval were complete.      Two sessile polyps were found in the rectum. The polyps were 3 to 5 mm       in size. These polyps were removed with a jumbo cold forceps. Resection       and retrieval were complete.      A 9 mm polyp was found in the transverse colon. The polyp was sessile.       To prevent bleeding after the polypectomy, one hemostatic clip was       successfully placed (MR conditional). There was no bleeding during, or       at the end, of the procedure.      The exam was otherwise without abnormality. Impression:            - Non-bleeding hemorrhoids.                        - The examination was otherwise normal.                        - No specimens collected. Recommendation:        - Patient has a contact number available for                         emergencies. The signs and symptoms of potential  delayed complications were discussed with the patient.                         Return to normal activities tomorrow. Written                         discharge instructions were provided to the patient.                        - Resume previous diet.                        - Continue present  medications.                        - Repeat colonoscopy is recommended for surveillance.                         The colonoscopy date will be determined after                         pathology results from today's exam become available                         for review.                        - Return to GI office in 2 months.                        - The findings and recommendations were discussed with                         the patient.                        - Await pathology results from EGD, also performed                         today. Procedure Code(s):     --- Professional ---                        [phone], Colonoscopy, flexible; with removal of                         tumor(s), polyp(s), or other lesion(s) by snare                         technique                        [id number], 9, Colonoscopy, flexible; with biopsy, single                         or multiple Diagnosis Code(s):     --- Professional ---                        R19.4, Change in bowel habit                        K62.5, Hemorrhage of anus and  rectum                        K64.1, Second degree hemorrhoids CPT copyright 2019 American Medical Association. All rights reserved. The codes documented in this report are preliminary and upon coder review may  be revised to meet current compliance requirements. Efrain Sella MD, MD 07/26/2020 12:41:50 PM This report has been signed electronically. Number of Addenda: 1 Note Initiated On: 07/26/2020 11:55 AM Scope Withdrawal Time: 0 hours 6 minutes 38 seconds  Total Procedure Duration: 0 hours 14 minutes 43 seconds  Estimated Blood Loss:  Estimated blood loss: none. Estimated blood loss: none.      [facility] Addendum Number: 1   Addendum Date: 08/03/2020 5:01:15 PM      The 67mm transverse colon polyp was removed with hot snare polypectomy       prior to placement of hemostatic clip. tkt Efrain Sella MD, MD 08/03/2020 5:02:15 PM This report has  been signed electronically.

## 2020-07-26 NOTE — Transfer of Care (Signed)
Immediate Anesthesia Transfer of Care Note  Patient: Molly Matthews  Procedure(s) Performed: COLONOSCOPY WITH PROPOFOL (N/A ) ESOPHAGOGASTRODUODENOSCOPY (EGD) WITH PROPOFOL (N/A )  Patient Location: PACU and Endoscopy Unit  Anesthesia Type:General  Level of Consciousness: drowsy and patient cooperative  Airway & Oxygen Therapy: Patient Spontanous Breathing and Patient connected to nasal cannula oxygen  Post-op Assessment: Report given to RN and Post -op Vital signs reviewed and stable  Post vital signs: Reviewed and stable  Last Vitals:  Vitals Value Taken Time  BP 120/57 07/26/20 1240  Temp    Pulse 55 07/26/20 1240  Resp 28 07/26/20 1240  SpO2 86 % 07/26/20 1240  Vitals shown include unvalidated device data.  Last Pain:  Vitals:   07/26/20 1133  TempSrc: Temporal  PainSc: 0-No pain         Complications: No complications documented.

## 2020-07-26 NOTE — Op Note (Signed)
[facility] Gastroenterology Patient Name: Molly Matthews Procedure Date: 07/26/2020 11:55 AM MRN: [id number] Account #: [id number] Date of Birth: 01-17-1951 Admit Type: Outpatient Age: 70 Room: [facility] ENDO ROOM 2 Gender: Female Note Status: Finalized Procedure:             Upper GI endoscopy Indications:           Epigastric abdominal pain, Nausea with vomiting Providers:             Benay Pike. Alice Reichert MD, MD Referring MD:          Rubbie Battiest. Iona Beard MD, MD (Referring MD) Medicines:             Propofol per Anesthesia Complications:         No immediate complications. Procedure:             Pre-Anesthesia Assessment:                        - The risks and benefits of the procedure and the                         sedation options and risks were discussed with the                         patient. All questions were answered and informed                         consent was obtained.                        - Patient identification and proposed procedure were                         verified prior to the procedure by the nurse. The                         procedure was verified in the procedure room.                        - ASA Grade Assessment: III - A patient with severe                         systemic disease.                        - After reviewing the risks and benefits, the patient                         was deemed in satisfactory condition to undergo the                         procedure.                        After obtaining informed consent, the endoscope was                         passed under direct vision. Throughout the procedure,  the patient's blood pressure, pulse, and oxygen                         saturations were monitored continuously. The Endoscope                         was introduced through the mouth, and advanced to the                         third part of duodenum. The upper GI endoscopy was                          accomplished without difficulty. The patient tolerated                         the procedure well. Findings:      The examined esophagus was normal.      Patchy moderately erythematous mucosa without bleeding was found in the       entire examined stomach. Biopsies were taken with a cold forceps for       histology.      The examined duodenum was normal.      The exam was otherwise without abnormality. Impression:            - Normal esophagus.                        - Erythematous mucosa in the stomach. Biopsied.                        - Normal examined duodenum.                        - The examination was otherwise normal. Recommendation:        - Await pathology results.                        - Proceed with colonoscopy Procedure Code(s):     --- Professional ---                        [id number], Esophagogastroduodenoscopy, flexible,                         transoral; with biopsy, single or multiple Diagnosis Code(s):     --- Professional ---                        R11.2, Nausea with vomiting, unspecified                        R10.13, Epigastric pain                        K31.89, Other diseases of stomach and duodenum CPT copyright 2019 American Medical Association. All rights reserved. The codes documented in this report are preliminary and upon coder review may  be revised to meet current compliance requirements. Efrain Sella MD, MD 07/26/2020 12:16:13 PM This report has been signed electronically. Number of Addenda: 0 Note Initiated On: 07/26/2020 11:55 AM Estimated Blood Loss:  Estimated blood loss: none.      [facility]

## 2020-07-27 ENCOUNTER — Encounter: Payer: Self-pay | Admitting: Internal Medicine

## 2020-07-27 NOTE — Anesthesia Postprocedure Evaluation (Signed)
Anesthesia Post Note  Patient: Molly Matthews  Procedure(s) Performed: COLONOSCOPY WITH PROPOFOL (N/A ) ESOPHAGOGASTRODUODENOSCOPY (EGD) WITH PROPOFOL (N/A )  Patient location during evaluation: PACU Anesthesia Type: General Level of consciousness: awake and alert Pain management: pain level controlled Vital Signs Assessment: post-procedure vital signs reviewed and stable Respiratory status: spontaneous breathing, nonlabored ventilation, respiratory function stable and patient connected to nasal cannula oxygen Cardiovascular status: blood pressure returned to baseline and stable Postop Assessment: no apparent nausea or vomiting Anesthetic complications: no   No complications documented.   Last Vitals:  Vitals:   07/26/20 1326 07/26/20 1332  BP: 124/60 138/61  Pulse: 73 78  Resp: 14 16  Temp:    SpO2: 96% 96%    Last Pain:  Vitals:   07/27/20 0731  TempSrc:   PainSc: 0-No pain                 [name]

## 2020-07-28 LAB — SURGICAL PATHOLOGY

## 2020-09-09 ENCOUNTER — Other Ambulatory Visit: Payer: Self-pay

## 2020-09-09 ENCOUNTER — Encounter (INDEPENDENT_AMBULATORY_CARE_PROVIDER_SITE_OTHER): Payer: Self-pay | Admitting: Vascular Surgery

## 2020-09-09 ENCOUNTER — Ambulatory Visit (INDEPENDENT_AMBULATORY_CARE_PROVIDER_SITE_OTHER): Payer: Medicare Other

## 2020-09-09 ENCOUNTER — Ambulatory Visit (INDEPENDENT_AMBULATORY_CARE_PROVIDER_SITE_OTHER): Payer: Medicare Other | Admitting: Vascular Surgery

## 2020-09-09 VITALS — BP 110/71 | HR 75 | Resp 16 | Wt 148.8 lb

## 2020-09-09 DIAGNOSIS — J449 Chronic obstructive pulmonary disease, unspecified: Secondary | ICD-10-CM

## 2020-09-09 DIAGNOSIS — I872 Venous insufficiency (chronic) (peripheral): Secondary | ICD-10-CM | POA: Diagnosis not present

## 2020-09-09 DIAGNOSIS — I739 Peripheral vascular disease, unspecified: Secondary | ICD-10-CM | POA: Diagnosis not present

## 2020-09-09 DIAGNOSIS — I6523 Occlusion and stenosis of bilateral carotid arteries: Secondary | ICD-10-CM | POA: Diagnosis not present

## 2020-09-09 DIAGNOSIS — I712 Thoracic aortic aneurysm, without rupture, unspecified: Secondary | ICD-10-CM

## 2020-09-09 DIAGNOSIS — E782 Mixed hyperlipidemia: Secondary | ICD-10-CM

## 2020-09-09 NOTE — Progress Notes (Signed)
MRN : [id number]  ZILLAH ALEXIE is a 70 y.o. ([birth year]) female who presents with chief complaint of  Chief Complaint  Patient presents with  . Follow-up    Ultrasound follow up  .  History of Present Illness:   The patient is seen for follow up evaluation of carotid stenosis. The carotid stenosis followed by ultrasound.   The patient denies amaurosis fugax. There is no recent history of TIA symptoms or focal motor deficits. There is no prior documented CVA.  The patient is taking enteric-coated aspirin 81 mg daily.  There is no history of migraine headaches. There is no history of seizures.  The patientis also here forfollowup and review of noninvasive studies of the lower extremities. There have been no interval changes in lower extremity symptoms. No interval shortening of the patient's claudication distance or development of rest pain symptoms. However, she does have occasional episodes of numbness in her right foot which gets better when she stands.  No new ulcers or wounds have occurred since the last visit.  There have been no significant changes to the patient's overall health care.  The patient has a history of coronary artery disease, no recent episodes of angina or shortness of breath. There is a history of hyperlipidemia which is being treated with a statin.   Carotid Duplex done today showsright ICA 40-59% (s/p right CEA) and known occlusion of the left ICA. No change compared to last study in 09/04/2019  ABI Rt=0.87and Lt=1.33(previous ABI's Rt=1.02and Lt=1.33)  Current Meds  Medication Sig  . acetaminophen (TYLENOL) 500 MG tablet Take 1,000 mg by mouth every 6 (six) hours as needed.  Marland Kitchen albuterol (PROVENTIL HFA;VENTOLIN HFA) 108 (90 Base) MCG/ACT inhaler Inhale 2 puffs into the lungs every 6 (six) hours as needed for wheezing or shortness of breath.  . citalopram (CELEXA) 40 MG tablet Take 40 mg by mouth at bedtime.   . clopidogrel (PLAVIX) 75 MG  tablet Take by mouth.  . ergocalciferol (VITAMIN D2) 1.25 MG (50000 UT) capsule Take 50,000 Units by mouth once a week.  . pantoprazole (PROTONIX) 40 MG tablet Take 40 mg by mouth daily.  . rosuvastatin (CRESTOR) 5 MG tablet Take 1/2  tab by mouth every other day M, W, F  . umeclidinium-vilanterol (ANORO ELLIPTA) 62.5-25 MCG/INH AEPB Inhale 1 puff into the lungs daily.    Past Medical History:  Diagnosis Date  . Anginal pain ([place])   . Anxiety   . Aortic atherosclerosis ([place])   . Arthritis   . Cancer ([place]) 01/2017   Skin cancer on right leg  . Carotid artery stenosis   . Carotid stenosis, left    100 % blockage  . Cholelithiasis   . Chronic enteritis   . Chronic venous insufficiency   . Colon polyps   . COPD (chronic obstructive pulmonary disease) (HCC)    mild  . Coronary artery disease   . Depression   . Diastasis recti   . Diverticulosis   . Diverticulosis   . Dyspnea   . Erosive esophagitis   . Esophagitis   . Fatty liver   . Fatty liver disease, nonalcoholic   . Gastritis   . GERD (gastroesophageal reflux disease)   . Headache   . Herniated lumbar intervertebral disc   . Herniated lumbar intervertebral disc   . Hyperlipemia   . Hyperlipidemia   . Hypertension   . IBS (irritable bowel syndrome)   . Left carotid artery stenosis   . Osteopenia   .  Osteopenia   . Peptic ulcer disease   . Personal history of tobacco use, presenting hazards to health 03/14/2016  . SCC (squamous cell carcinoma)   . Stroke ([place])    TIA's X 2  . Tobacco dependence   . Vitamin D deficiency     Past Surgical History:  Procedure Laterality Date  . ABDOMINAL HYSTERECTOMY    . BREAST BIOPSY Left    negative 2008  . CAROTID ENDARTERECTOMY    . CHOLECYSTECTOMY    . COLONOSCOPY W/ POLYPECTOMY    . COLONOSCOPY WITH PROPOFOL N/A 07/26/2020   Procedure: COLONOSCOPY WITH PROPOFOL;  Surgeon: Toledo, Benay Pike, MD;  Location: [facility] ENDOSCOPY;  Service: Gastroenterology;  Laterality: N/A;   . DILATION AND CURETTAGE OF UTERUS    . ESOPHAGOGASTRODUODENOSCOPY (EGD) WITH PROPOFOL N/A 01/26/2017   Procedure: ESOPHAGOGASTRODUODENOSCOPY (EGD) WITH PROPOFOL;  Surgeon: Toledo, Benay Pike, MD;  Location: [facility] ENDOSCOPY;  Service: Gastroenterology;  Laterality: N/A;  . ESOPHAGOGASTRODUODENOSCOPY (EGD) WITH PROPOFOL N/A 07/26/2020   Procedure: ESOPHAGOGASTRODUODENOSCOPY (EGD) WITH PROPOFOL;  Surgeon: Toledo, Benay Pike, MD;  Location: [facility] ENDOSCOPY;  Service: Gastroenterology;  Laterality: N/A;  . HEMORRHOID SURGERY N/A 07/25/2016   Procedure: HEMORRHOIDECTOMY INTERNAL AND EXTERNAL;  Surgeon: Leonie Green, MD;  Location: [facility] ORS;  Service: General;  Laterality: N/A;  . MICROLARYNGOSCOPY N/A 01/29/2019   Procedure: MICROLARYNGOSCOPY with Vocal Cord polyp removal.   No laser is needed.;  Surgeon: Clyde Canterbury, MD;  Location: [facility] ORS;  Service: ENT;  Laterality: N/A;  . VASCULAR SURGERY Right    Carotid Endarterectomy     Social History Social History   Tobacco Use  . Smoking status: Current Every Day Smoker    Packs/day: 1.00    Years: 45.00    Pack years: 45.00    Types: Cigarettes  . Smokeless tobacco: Never Used  Vaping Use  . Vaping Use: Never used  Substance Use Topics  . Alcohol use: No  . Drug use: No    Family History Family History  Problem Relation Age of Onset  . Breast cancer Mother 24  . Atrial fibrillation Mother   . Breast cancer Sister 97  . Heart attack Father   . Colon cancer Brother   . Lung cancer Brother     Allergies  Allergen Reactions  . Buspirone     Other reaction(s): Headache  . Chantix [Varenicline Tartrate] Other (See Comments)    Jittery muscle movements  . Statins Other (See Comments)    Muscle and joint aches - can take in small doses  . Zithromax [Azithromycin] Other (See Comments)    Back Pain  . Elemental Sulfur Rash     REVIEW OF SYSTEMS (Negative unless checked)  Constitutional: [] Weight loss  [] Fever   [] Chills Cardiac: [] Chest pain   [] Chest pressure   [] Palpitations   [] Shortness of breath when laying flat   [] Shortness of breath with exertion. Vascular:  [] Pain in legs with walking   [] Pain in legs at rest  [] History of DVT   [] Phlebitis   [] Swelling in legs   [] Varicose veins   [] Non-healing ulcers Pulmonary:   [] Uses home oxygen   [] Productive cough   [] Hemoptysis   [] Wheeze  [] COPD   [] Asthma Neurologic:  [] Dizziness   [] Seizures   [] History of stroke   [] History of TIA  [] Aphasia   [] Vissual changes   [] Weakness or numbness in arm   [x] Weakness or numbness in leg Musculoskeletal:   [] Joint swelling   [x] Joint pain   [x] Low  back pain Hematologic:  [] Easy bruising  [] Easy bleeding   [] Hypercoagulable state   [] Anemic Gastrointestinal:  [] Diarrhea   [] Vomiting  [] Gastroesophageal reflux/heartburn   [] Difficulty swallowing. Genitourinary:  [] Chronic kidney disease   [] Difficult urination  [] Frequent urination   [] Blood in urine Skin:  [] Rashes   [] Ulcers  Psychological:  [] History of anxiety   []  History of major depression.  Physical Examination  Vitals:   09/09/20 1101  BP: 110/71  Pulse: 75  Resp: 16  Weight: 148 lb 12.8 oz (67.5 kg)   Body mass index is 27.22 kg/m. Gen: WD/WN, NAD Head: [place]/AT, No temporalis wasting.  Ear/Nose/Throat: Hearing grossly intact, nares w/o erythema or drainage Eyes: PER, EOMI, sclera nonicteric.  Neck: Supple, no large masses.   Pulmonary:  Good air movement, no audible wheezing bilaterally, no use of accessory muscles.  Cardiac: RRR, no JVD Vascular: bilateral carotid bruits Vessel Right Left  Radial Palpable Palpable  Carotid Palpable Palpable  PT Not Palpable Palpable  DP Not Palpable Palpable  Gastrointestinal: Non-distended. No guarding/no peritoneal signs.  Musculoskeletal: M/S 5/5 throughout.  No deformity or atrophy.  Neurologic: CN 2-12 intact. Symmetrical.  Speech is fluent. Motor exam as listed above. Psychiatric: Judgment intact,  Mood & affect appropriate for pt's clinical situation. Dermatologic: No rashes or ulcers noted.  No changes consistent with cellulitis.   CBC Lab Results  Component Value Date   WBC 10.8 (H) 04/25/2018   HGB 15.0 04/25/2018   HCT 43.5 04/25/2018   MCV 95.0 04/25/2018   PLT 228 04/25/2018    BMET    Component Value Date/Time   NA 142 04/25/2018 1713   NA 142 01/29/2014 2124   K 4.6 04/25/2018 1713   K 4.0 01/29/2014 2124   CL 108 04/25/2018 1713   CL 110 (H) 01/29/2014 2124   CO2 28 04/25/2018 1713   CO2 25 01/29/2014 2124   GLUCOSE 101 (H) 04/25/2018 1713   GLUCOSE 137 (H) 01/29/2014 2124   BUN 10 04/25/2018 1713   BUN 10 01/29/2014 2124   CREATININE 0.75 04/25/2018 1713   CREATININE 1.00 01/29/2014 2124   CALCIUM 9.8 04/25/2018 1713   CALCIUM 9.3 01/29/2014 2124   GFRNONAA >60 04/25/2018 1713   GFRNONAA 59 (L) 01/29/2014 2124   GFRNONAA >60 12/22/2013 1159   GFRAA >60 04/25/2018 1713   GFRAA >60 01/29/2014 2124   GFRAA >60 12/22/2013 1159   CrCl cannot be calculated (Patient's most recent lab result is older than the maximum 21 days allowed.).  COAG No results found for: INR, PROTIME  Radiology No results found.   Assessment/Plan 1. Bilateral carotid artery stenosis Recommend:  Given the patient's asymptomatic subcritical stenosis no further invasive testing or surgery at this time.  Carotid Duplex done today showsright ICA <30% (s/p right CEA) and known occlusion of the left ICA.  Continue antiplatelet therapy as prescribed Continue management of CAD, HTN and Hyperlipidemia Healthy heart diet,  encouraged exercise at least 4 times per week Follow up in 12 months with duplex ultrasound and physical exam   - VAS US CAROTID; Future  2. PAD (peripheral artery disease) (HCC) Recommend:  The patient has evidence of atherosclerosis of the lower extremities with claudication.  The patient does not voice lifestyle limiting changes at this point in  time.  Noninvasive studies do not suggest clinically significant change.  No invasive studies, angiography or surgery at this time The patient should continue walking and begin a more formal exercise program.  The patient should  continue antiplatelet therapy and aggressive treatment of the lipid abnormalities  No changes in the patient's medications at this time  The patient should continue wearing graduated compression socks 15-20 mmHg strength to control the mild edema.  - VAS [place] ABI WITH/WO TBI; Future  3. Thoracic aortic aneurysm without rupture [facility]) The patient's most recent chest CT was performed in December 2020 (04/11/2019) just 5 months ago.  At that time the thoracic aneurysm was 2.7 cm not substantially changed since the scan from 1 year ago.  I have personally reviewed the scan from 04/11/2019 and concur with this evaluation  Given this finding no further intervention is indicated at this time.  We will continue to follow in correlation with cancer screening CTs.  4. Chronic venous insufficiency No surgery or intervention at this point in time.    I have had a long discussion with the patient regarding venous insufficiency and why it  causes symptoms. I have discussed with the patient the chronic skin changes that accompany venous insufficiency and the long term sequela such as infection and ulceration.  Patient will begin wearing graduated compression stockings on a daily basis a prescription was given. The patient will put the stockings on first thing in the morning and removing them in the evening. The patient is instructed specifically not to sleep in the stockings.    In addition, behavioral modification including several periods of elevation of the lower extremities during the day will be continued. I have demonstrated that proper elevation is a position with the ankles at heart level.  The patient is instructed to begin routine exercise, especially walking on a  daily basis  Following the review of the ultrasound the patient will follow up in 12 months to reassess the degree of swelling and the control that graduated compression stockings or compression wraps  is offering.   The patient can be assessed for a Lymph Pump at that time  5. COPD mixed type ([place]) Continue pulmonary medications and aerosols as already ordered, these medications have been reviewed and there are no changes at this time.    6. Mixed hyperlipidemia Continue statin as ordered and reviewed, no changes at this time    Hortencia Pilar, MD  09/09/2020 11:18 AM

## 2020-09-30 DIAGNOSIS — R112 Nausea with vomiting, unspecified: Secondary | ICD-10-CM | POA: Insufficient documentation

## 2020-11-05 IMAGING — CT CT CHEST LUNG CANCER SCREENING LOW DOSE W/O CM
2 of 5 series · 15 of 40 positions shown, 18 images · non-contrast
Comparison: 04/10/2017

CLINICAL DATA: Forty-seven pack-year smoking history. Current
smoker.

EXAM:
CT CHEST WITHOUT CONTRAST LOW-DOSE FOR LUNG CANCER SCREENING
TECHNIQUE: Multidetector CT imaging of the chest was performed following the
standard protocol without IV contrast.

[Series 3: lung · axial · 0.68mm/px · z∈[-1279,-979]mm · 12 of 334 slices shown, 15 images (1 of 2)]
[im 17/334  mediastinal]
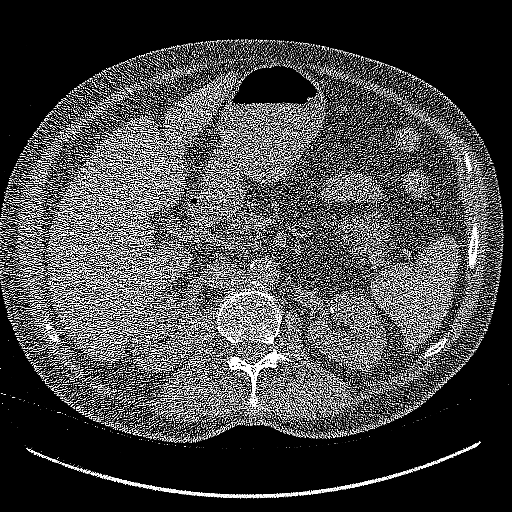
[im 17/334  lung]
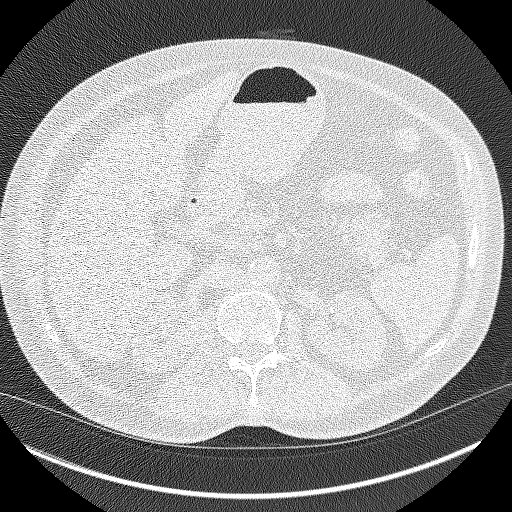
[im 50/334  lung]
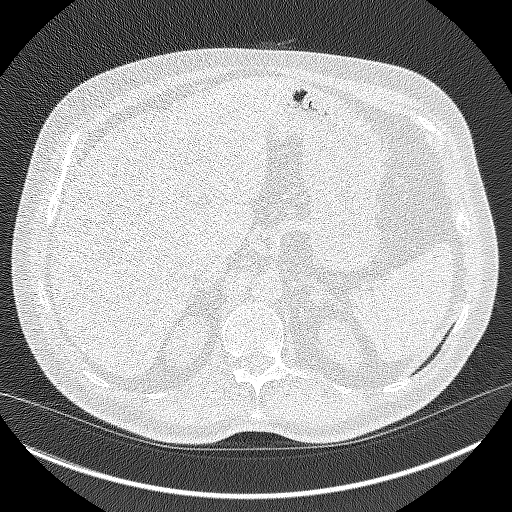
[im 67/334  lung]
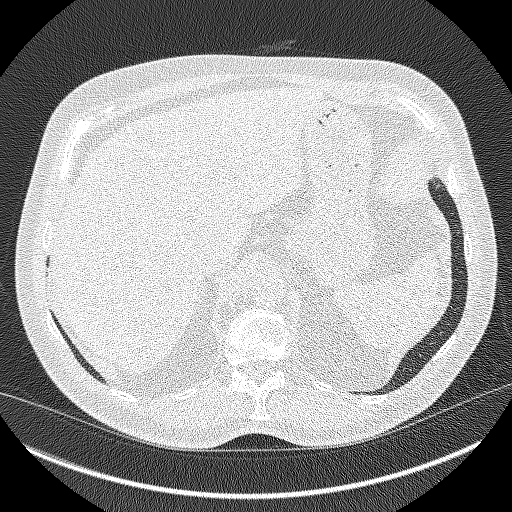
[im 100/334  lung]
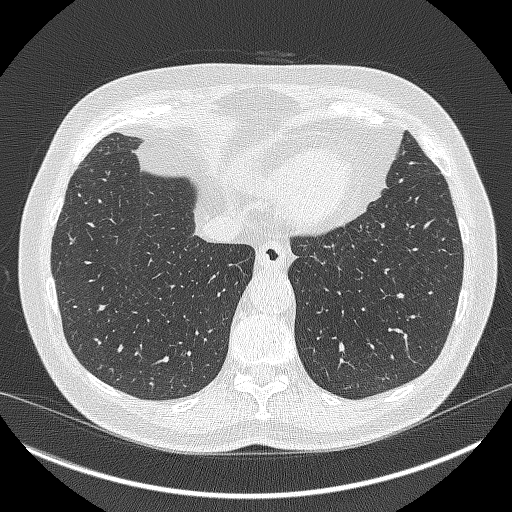
[im 134/334  mediastinal]
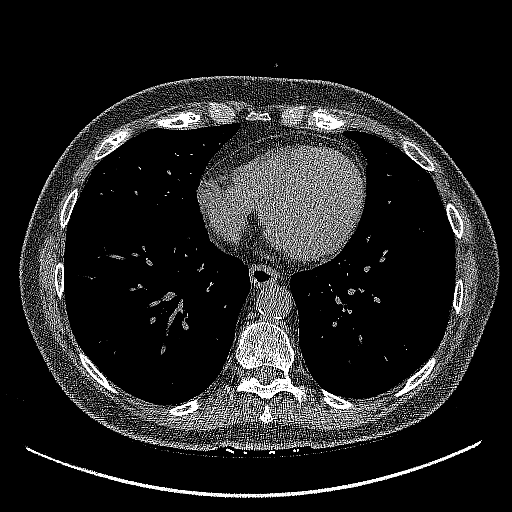
[im 134/334  lung]
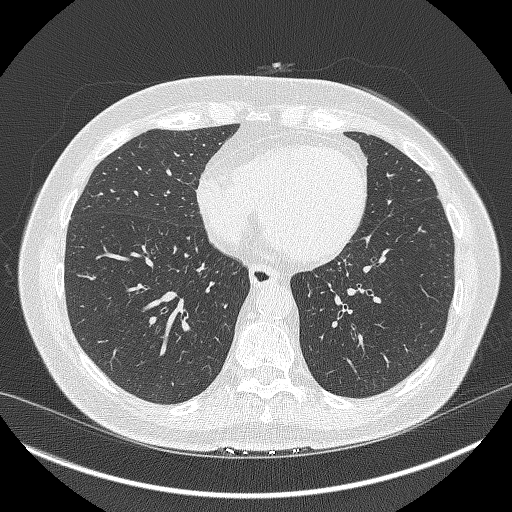
[im 150/334  lung]
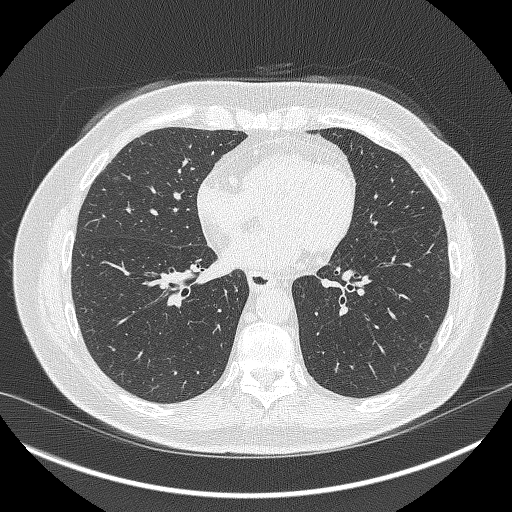
[im 184/334  lung]
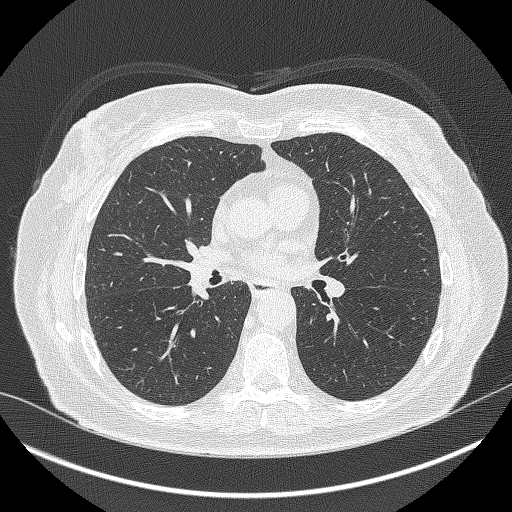
[im 200/334  lung]
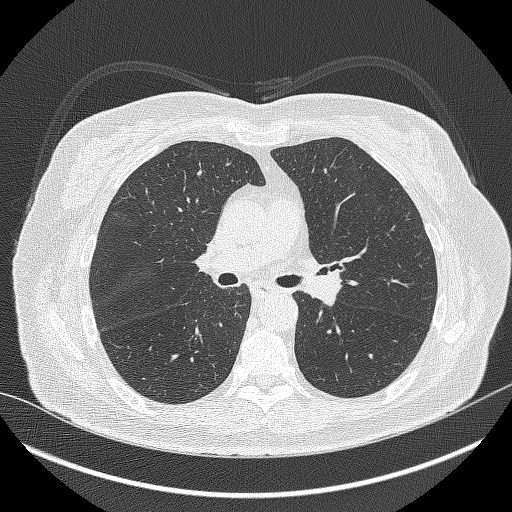
[im 234/334  mediastinal]
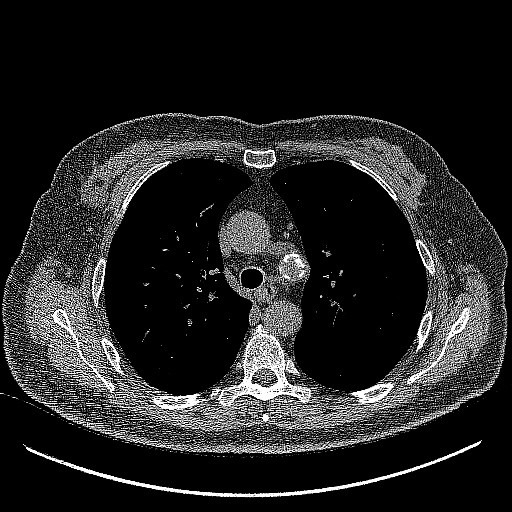
[im 234/334  lung]
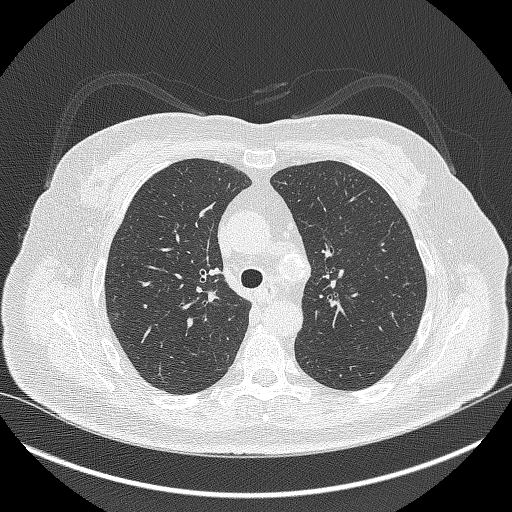
[im 267/334  lung]
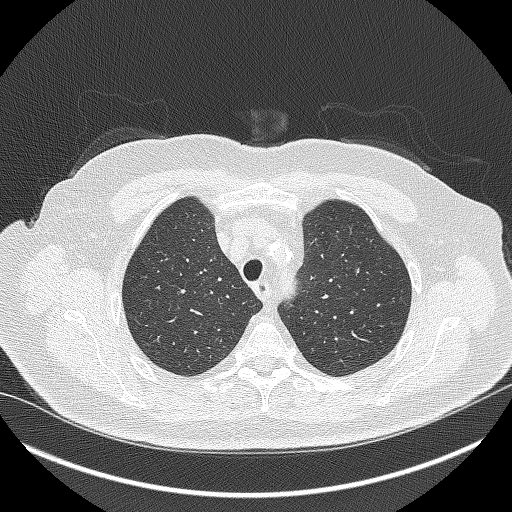
[im 284/334  lung]
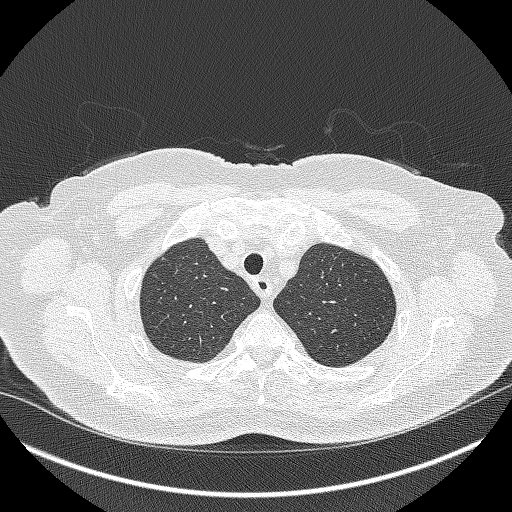
[im 317/334  lung]
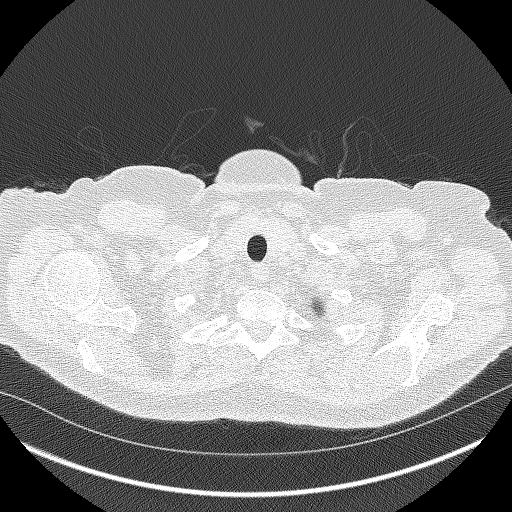

[Series 4: lung · coronal · 0.65mm/px · 3 of 299 slices shown (2 of 2)]
[im 60/299  lung]
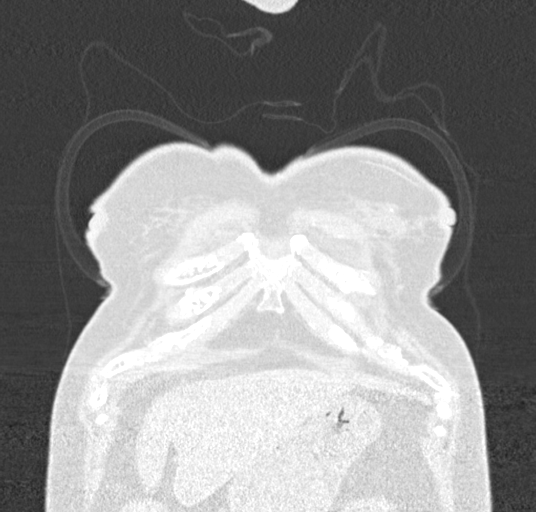
[im 120/299  lung]
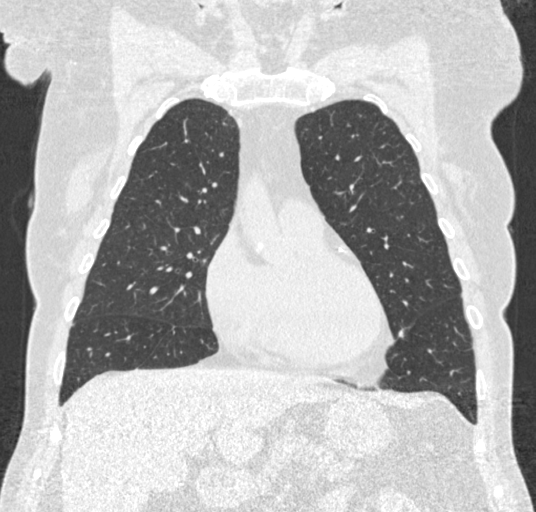
[im 179/299  lung]
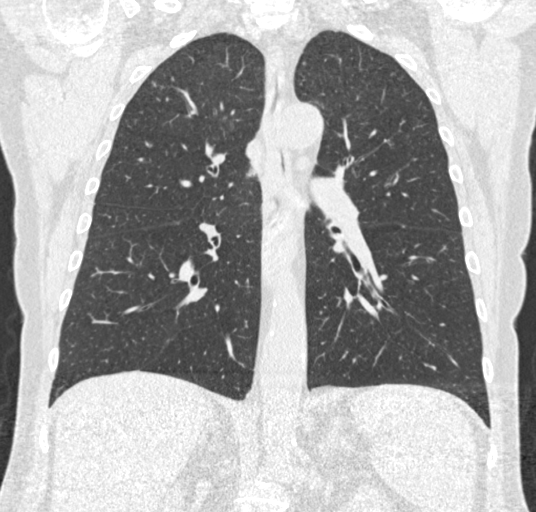

[15 of 40 positions shown; findings below may reference images not displayed]

FINDINGS: Cardiovascular: Aortic and branch vessel atherosclerosis.
Redemonstration of saccular aneurysm or pseudoaneurysm off of the
inferior aspect of the transverse aorta. Similar, including at 3.7 x
3.0 cm on coronal image 164. Normal heart size, without pericardial
effusion. Mild lipomatous hypertrophy of the interatrial septum.
Multivessel coronary artery atherosclerosis.

Mediastinum/Nodes: No mediastinal or definite hilar adenopathy,
given limitations of unenhanced CT.

Lungs/Pleura: No pleural fluid. Mild centrilobular emphysema.
Pulmonary nodules of maximally volume derived equivalent diameter
3.7 mm are not significantly changed.

Upper Abdomen: Cholecystectomy. Normal imaged portions of the
spleen, stomach, pancreas, adrenal glands, kidneys. Aortic and
branch vessel atherosclerosis, including left renal vascular
calcifications.

Musculoskeletal: Minimal superior endplate irregularity is similar
at T4 and L1.
IMPRESSION: 1. Lung-RADS 2, benign appearance or behavior. Continue annual
screening with low-dose chest CT without contrast in 12 months.
2. Aortic atherosclerosis (N9H3T-[id number].Z), coronary artery
atherosclerosis and emphysema ([id number]).
3. Similar saccular aneurysm or pseudoaneurysm off the inferior
transverse aorta.

## 2020-12-06 ENCOUNTER — Other Ambulatory Visit: Payer: Self-pay | Admitting: Family Medicine

## 2020-12-06 DIAGNOSIS — Z1231 Encounter for screening mammogram for malignant neoplasm of breast: Secondary | ICD-10-CM

## 2020-12-07 ENCOUNTER — Telehealth (INDEPENDENT_AMBULATORY_CARE_PROVIDER_SITE_OTHER): Payer: Self-pay | Admitting: Vascular Surgery

## 2020-12-07 DIAGNOSIS — I70219 Atherosclerosis of native arteries of extremities with intermittent claudication, unspecified extremity: Secondary | ICD-10-CM | POA: Insufficient documentation

## 2020-12-07 NOTE — Telephone Encounter (Signed)
Called stating that her rle is hurting and she is having difficulty controlling the pain and would like to come in to be seen. Patient was last seen 08/2020 with carotid and abi studies. I made Cody aware and he advised me to bring her in for rle arterial and abi studies as schedule allows.  This note is for documentation purposes.

## 2020-12-07 NOTE — Progress Notes (Signed)
MRN : [id number]  Molly Matthews is a 70 y.o. (Sep 09, 1950) female who presents with chief complaint of look at the right leg.  History of Present Illness:   The patient returns to the office sooner than expected and for review of the noninvasive studies. There has been a slight change in her right leg symptoms.  The patient notes interval shortening of their claudication distance.   Her primary concern is an increase in the numbness/ pins and needles like sensation particularly in the toes.  It also occurs with rest.  No new ulcers or wounds have occurred since the last visit.  There have been no significant changes to the patient's overall health care.  The patient denies amaurosis fugax or recent TIA symptoms. There are no recent neurological changes noted. The patient denies history of DVT, PE or superficial thrombophlebitis. The patient denies recent episodes of angina or shortness of breath.   ABI from (09/09/2020) Rt=0.98 and Lt=1.06  (previous ABI's from (09/09/2020) Rt=0.87 and Lt=1.33) Duplex US of the lower extremity arterial system shows a 50% mid SFA lesion (subcritical with velocities of 215 cm/sec.   No outpatient medications have been marked as taking for the 12/09/20 encounter (Appointment) with Delana Meyer, Dolores Lory, MD.    Past Medical History:  Diagnosis Date   Anginal pain ([place])    Anxiety    Aortic atherosclerosis ([place])    Arthritis    Cancer ([place]) 01/2017   Skin cancer on right leg   Carotid artery stenosis    Carotid stenosis, left    100 % blockage   Cholelithiasis    Chronic enteritis    Chronic venous insufficiency    Colon polyps    COPD (chronic obstructive pulmonary disease) (HCC)    mild   Coronary artery disease    Depression    Diastasis recti    Diverticulosis    Diverticulosis    Dyspnea    Erosive esophagitis    Esophagitis    Fatty liver    Fatty liver disease, nonalcoholic    Gastritis    GERD (gastroesophageal reflux disease)     Headache    Herniated lumbar intervertebral disc    Herniated lumbar intervertebral disc    Hyperlipemia    Hyperlipidemia    Hypertension    IBS (irritable bowel syndrome)    Left carotid artery stenosis    Osteopenia    Osteopenia    Peptic ulcer disease    Personal history of tobacco use, presenting hazards to health 03/14/2016   SCC (squamous cell carcinoma)    Stroke ([place])    TIA's X 2   Tobacco dependence    Vitamin D deficiency     Past Surgical History:  Procedure Laterality Date   ABDOMINAL HYSTERECTOMY     BREAST BIOPSY Left    negative 2008   CAROTID ENDARTERECTOMY     CHOLECYSTECTOMY     COLONOSCOPY W/ POLYPECTOMY     COLONOSCOPY WITH PROPOFOL N/A 07/26/2020   Procedure: COLONOSCOPY WITH PROPOFOL;  Surgeon: Toledo, Benay Pike, MD;  Location: [facility] ENDOSCOPY;  Service: Gastroenterology;  Laterality: N/A;   DILATION AND CURETTAGE OF UTERUS     ESOPHAGOGASTRODUODENOSCOPY (EGD) WITH PROPOFOL N/A 01/26/2017   Procedure: ESOPHAGOGASTRODUODENOSCOPY (EGD) WITH PROPOFOL;  Surgeon: Toledo, Benay Pike, MD;  Location: [facility] ENDOSCOPY;  Service: Gastroenterology;  Laterality: N/A;   ESOPHAGOGASTRODUODENOSCOPY (EGD) WITH PROPOFOL N/A 07/26/2020   Procedure: ESOPHAGOGASTRODUODENOSCOPY (EGD) WITH PROPOFOL;  Surgeon: Alice Reichert, Benay Pike, MD;  Location[facility]  ENDOSCOPY;  Service: Gastroenterology;  Laterality: N/A;   HEMORRHOID SURGERY N/A 07/25/2016   Procedure: HEMORRHOIDECTOMY INTERNAL AND EXTERNAL;  Surgeon: Leonie Green, MD;  Location: [facility] ORS;  Service: General;  Laterality: N/A;   MICROLARYNGOSCOPY N/A 01/29/2019   Procedure: MICROLARYNGOSCOPY with Vocal Cord polyp removal.   No laser is needed.;  Surgeon: Clyde Canterbury, MD;  Location: [facility] ORS;  Service: ENT;  Laterality: N/A;   VASCULAR SURGERY Right    Carotid Endarterectomy     Social History Social History   Tobacco Use   Smoking status: Every Day    Packs/day: 1.00    Years: 45.00    Pack years: 45.00    Types:  Cigarettes   Smokeless tobacco: Never  Vaping Use   Vaping Use: Never used  Substance Use Topics   Alcohol use: No   Drug use: No    Family History Family History  Problem Relation Age of Onset   Breast cancer Mother 68   Atrial fibrillation Mother    Breast cancer Sister 68   Heart attack Father    Colon cancer Brother    Lung cancer Brother     Allergies  Allergen Reactions   Buspirone     Other reaction(s): Headache   Chantix [Varenicline Tartrate] Other (See Comments)    Jittery muscle movements   Statins Other (See Comments)    Muscle and joint aches - can take in small doses   Zithromax [Azithromycin] Other (See Comments)    Back Pain   Elemental Sulfur Rash     REVIEW OF SYSTEMS (Negative unless checked)  Constitutional: '[]'$ Weight loss  '[]'$ Fever  '[]'$ Chills Cardiac: '[]'$ Chest pain   '[]'$ Chest pressure   '[]'$ Palpitations   '[]'$ Shortness of breath when laying flat   '[]'$ Shortness of breath with exertion. Vascular:  '[x]'$ Pain in legs with walking   '[]'$ Pain in legs at rest  '[]'$ History of DVT   '[]'$ Phlebitis   '[]'$ Swelling in legs   '[]'$ Varicose veins   '[]'$ Non-healing ulcers Pulmonary:   '[]'$ Uses home oxygen   '[]'$ Productive cough   '[]'$ Hemoptysis   '[]'$ Wheeze  '[x]'$ COPD   '[]'$ Asthma Neurologic:  '[]'$ Dizziness   '[]'$ Seizures   '[x]'$ History of stroke   '[]'$ History of TIA  '[]'$ Aphasia   '[]'$ Vissual changes   '[]'$ Weakness or numbness in arm   '[]'$ Weakness or numbness in leg Musculoskeletal:   '[]'$ Joint swelling   '[]'$ Joint pain   '[]'$ Low back pain Hematologic:  '[]'$ Easy bruising  '[]'$ Easy bleeding   '[]'$ Hypercoagulable state   '[]'$ Anemic Gastrointestinal:  '[]'$ Diarrhea   '[]'$ Vomiting  '[x]'$ Gastroesophageal reflux/heartburn   '[]'$ Difficulty swallowing. Genitourinary:  '[]'$ Chronic kidney disease   '[]'$ Difficult urination  '[]'$ Frequent urination   '[]'$ Blood in urine Skin:  '[]'$ Rashes   '[]'$ Ulcers  Psychological:  '[]'$ History of anxiety   '[]'$  History of major depression.  Physical Examination  There were no vitals filed for this visit. There is no height or  weight on file to calculate BMI. Gen: WD/WN, NAD Head: [place]/AT, No temporalis wasting.  Ear/Nose/Throat: Hearing grossly intact, nares w/o erythema or drainage Eyes: PER, EOMI, sclera nonicteric.  Neck: Supple, no masses.  No bruit or JVD.  Pulmonary:  Good air movement, no audible wheezing, no use of accessory muscles.  Cardiac: RRR, normal S1, S2, no Murmurs. Vascular:   no wounds either foot Vessel Right Left  Radial Palpable Palpable  PT Not Palpable Not Palpable  DP Not Palpable Not Palpable  Gastrointestinal: soft, non-distended. No guarding/no peritoneal signs.  Musculoskeletal: M/S 5/5 throughout.  No visible deformity.  Neurologic:  CN 2-12 intact. Pain and light touch intact in extremities.  Symmetrical.  Speech is fluent. Motor exam as listed above. Psychiatric: Judgment intact, Mood & affect appropriate for pt's clinical situation. Dermatologic: No rashes or ulcers noted.  No changes consistent with cellulitis.   CBC Lab Results  Component Value Date   WBC 10.8 (H) 04/25/2018   HGB 15.0 04/25/2018   HCT 43.5 04/25/2018   MCV 95.0 04/25/2018   PLT 228 04/25/2018    BMET    Component Value Date/Time   NA 142 04/25/2018 1713   NA 142 01/29/2014 2124   K 4.6 04/25/2018 1713   K 4.0 01/29/2014 2124   CL 108 04/25/2018 1713   CL 110 (H) 01/29/2014 2124   CO2 28 04/25/2018 1713   CO2 25 01/29/2014 2124   GLUCOSE 101 (H) 04/25/2018 1713   GLUCOSE 137 (H) 01/29/2014 2124   BUN 10 04/25/2018 1713   BUN 10 01/29/2014 2124   CREATININE 0.75 04/25/2018 1713   CREATININE 1.00 01/29/2014 2124   CALCIUM 9.8 04/25/2018 1713   CALCIUM 9.3 01/29/2014 2124   GFRNONAA >60 04/25/2018 1713   GFRNONAA 59 (L) 01/29/2014 2124   GFRNONAA >60 12/22/2013 1159   GFRAA >60 04/25/2018 1713   GFRAA >60 01/29/2014 2124   GFRAA >60 12/22/2013 1159   CrCl cannot be calculated (Patient's most recent lab result is older than the maximum 21 days allowed.).  COAG No results found for:  INR, PROTIME  Radiology No results found.   Assessment/Plan 1. Atherosclerosis of native artery of right lower extremity with intermittent claudication (HCC) Recommend:  I do not find evidence of Vascular pathology that would explain the patient's symptoms.  She does have mild PAD primarily of the right leg.  The patient has atypical pain symptoms for vascular disease  I do not find evidence of Vascular pathology that would explain the patient's symptoms and I suspect the patient is c/o pseudoclaudication.  Patient should have an evaluation of his LS spine which I defer to the primary service.  Noninvasive studies including right leg arterial ultrasound do not identify critical vascular problems  The patient should continue walking and begin a more formal exercise program. The patient should continue his antiplatelet therapy and aggressive treatment of the lipid abnormalities. The patient should begin wearing graduated compression socks 15-20 mmHg strength to control her mild edema.  Patient will follow-up with me as arranged already for May, 2023.    2. Chronic venous insufficiency No surgery or intervention at this point in time.  I have reviewed my discussion with the patient regarding venous insufficiency and why it causes symptoms. I have discussed with the patient the chronic skin changes that accompany venous insufficiency and the long term sequela such as ulceration. Patient will contnue wearing graduated compression stockings on a daily basis, as this has provided excellent control of his edema. The patient will put the stockings on first thing in the morning and removing them in the evening. The patient is reminded not to sleep in the stockings.  In addition, behavioral modification including elevation during the day will be initiated. Exercise is strongly encouraged.  Given the patient's good control and lack of any problems regarding the venous insufficiency and lymphedema  a lymph pump in not need at this time.    3. Bilateral carotid artery stenosis Recommend:  Given the patient's asymptomatic subcritical stenosis no further invasive testing or surgery at this time.  Continue antiplatelet therapy as prescribed Continue management  of CAD, HTN and Hyperlipidemia Healthy heart diet,  encouraged exercise at least 4 times per week Follow up in 12 months with duplex ultrasound and physical exam    4. Thoracic aortic aneurysm without rupture [facility]) The patient's most recent chest CT was performed in December 2020 (04/11/2019) just 5 months ago.  At that time the thoracic aneurysm was 2.7 cm not substantially changed since the scan from 1 year ago.   I have personally reviewed the scan from 04/11/2019 and concur with this evaluation   Given this finding no further intervention is indicated at this time.  We will continue to follow in correlation with cancer screening CTs.  5. Mixed hyperlipidemia Continue statin as ordered and reviewed, no changes at this time    Hortencia Pilar, MD  12/07/2020 2:24 PM

## 2020-12-08 ENCOUNTER — Other Ambulatory Visit (INDEPENDENT_AMBULATORY_CARE_PROVIDER_SITE_OTHER): Payer: Self-pay | Admitting: Nurse Practitioner

## 2020-12-08 DIAGNOSIS — M79604 Pain in right leg: Secondary | ICD-10-CM

## 2020-12-09 ENCOUNTER — Ambulatory Visit (INDEPENDENT_AMBULATORY_CARE_PROVIDER_SITE_OTHER): Payer: Medicare Other

## 2020-12-09 ENCOUNTER — Other Ambulatory Visit: Payer: Self-pay

## 2020-12-09 ENCOUNTER — Ambulatory Visit (INDEPENDENT_AMBULATORY_CARE_PROVIDER_SITE_OTHER): Payer: Medicare Other | Admitting: Vascular Surgery

## 2020-12-09 ENCOUNTER — Encounter (INDEPENDENT_AMBULATORY_CARE_PROVIDER_SITE_OTHER): Payer: Self-pay | Admitting: Vascular Surgery

## 2020-12-09 VITALS — BP 159/75 | HR 76 | Resp 16 | Wt 150.2 lb

## 2020-12-09 DIAGNOSIS — M79604 Pain in right leg: Secondary | ICD-10-CM

## 2020-12-09 DIAGNOSIS — I712 Thoracic aortic aneurysm, without rupture, unspecified: Secondary | ICD-10-CM

## 2020-12-09 DIAGNOSIS — I872 Venous insufficiency (chronic) (peripheral): Secondary | ICD-10-CM

## 2020-12-09 DIAGNOSIS — I6523 Occlusion and stenosis of bilateral carotid arteries: Secondary | ICD-10-CM

## 2020-12-09 DIAGNOSIS — E782 Mixed hyperlipidemia: Secondary | ICD-10-CM

## 2020-12-09 DIAGNOSIS — I70211 Atherosclerosis of native arteries of extremities with intermittent claudication, right leg: Secondary | ICD-10-CM | POA: Diagnosis not present

## 2020-12-21 ENCOUNTER — Other Ambulatory Visit: Payer: Self-pay | Admitting: Physician Assistant

## 2020-12-21 ENCOUNTER — Other Ambulatory Visit (HOSPITAL_COMMUNITY): Payer: Self-pay | Admitting: Physician Assistant

## 2020-12-21 DIAGNOSIS — R197 Diarrhea, unspecified: Secondary | ICD-10-CM

## 2020-12-21 DIAGNOSIS — R1032 Left lower quadrant pain: Secondary | ICD-10-CM

## 2020-12-21 DIAGNOSIS — R1084 Generalized abdominal pain: Secondary | ICD-10-CM

## 2020-12-31 ENCOUNTER — Ambulatory Visit
Admission: RE | Admit: 2020-12-31 | Discharge: 2020-12-31 | Disposition: A | Discharge status: Home / Self Care | Payer: Medicare Other | Source: Ambulatory Visit | Attending: Physician Assistant | Admitting: Physician Assistant

## 2020-12-31 ENCOUNTER — Other Ambulatory Visit: Payer: Self-pay

## 2020-12-31 ENCOUNTER — Emergency Department
Admission: EM | Admit: 2020-12-31 | Discharge: 2020-12-31 | Disposition: A | Discharge status: Home / Self Care | Payer: Medicare Other | Attending: Emergency Medicine | Admitting: Emergency Medicine

## 2020-12-31 ENCOUNTER — Emergency Department: Payer: Medicare Other

## 2020-12-31 DIAGNOSIS — I639 Cerebral infarction, unspecified: Secondary | ICD-10-CM | POA: Insufficient documentation

## 2020-12-31 DIAGNOSIS — I2510 Atherosclerotic heart disease of native coronary artery without angina pectoris: Secondary | ICD-10-CM | POA: Insufficient documentation

## 2020-12-31 DIAGNOSIS — J441 Chronic obstructive pulmonary disease with (acute) exacerbation: Secondary | ICD-10-CM | POA: Diagnosis not present

## 2020-12-31 DIAGNOSIS — R4701 Aphasia: Secondary | ICD-10-CM

## 2020-12-31 DIAGNOSIS — R1084 Generalized abdominal pain: Secondary | ICD-10-CM | POA: Diagnosis not present

## 2020-12-31 DIAGNOSIS — R197 Diarrhea, unspecified: Secondary | ICD-10-CM | POA: Insufficient documentation

## 2020-12-31 DIAGNOSIS — R410 Disorientation, unspecified: Secondary | ICD-10-CM | POA: Insufficient documentation

## 2020-12-31 DIAGNOSIS — R1032 Left lower quadrant pain: Secondary | ICD-10-CM | POA: Insufficient documentation

## 2020-12-31 DIAGNOSIS — Z86010 Personal history of colonic polyps: Secondary | ICD-10-CM | POA: Insufficient documentation

## 2020-12-31 DIAGNOSIS — I10 Essential (primary) hypertension: Secondary | ICD-10-CM | POA: Insufficient documentation

## 2020-12-31 DIAGNOSIS — E876 Hypokalemia: Secondary | ICD-10-CM | POA: Insufficient documentation

## 2020-12-31 DIAGNOSIS — Z79899 Other long term (current) drug therapy: Secondary | ICD-10-CM | POA: Insufficient documentation

## 2020-12-31 DIAGNOSIS — R569 Unspecified convulsions: Secondary | ICD-10-CM | POA: Insufficient documentation

## 2020-12-31 DIAGNOSIS — F17210 Nicotine dependence, cigarettes, uncomplicated: Secondary | ICD-10-CM | POA: Insufficient documentation

## 2020-12-31 DIAGNOSIS — Z85828 Personal history of other malignant neoplasm of skin: Secondary | ICD-10-CM | POA: Insufficient documentation

## 2020-12-31 LAB — COMPREHENSIVE METABOLIC PANEL
ALT: 17 U/L (ref 0–44)
AST: 21 U/L (ref 15–41)
Albumin: 3.6 g/dL (ref 3.5–5.0)
Alkaline Phosphatase: 46 U/L (ref 38–126)
Anion gap: 8 (ref 5–15)
BUN: 8 mg/dL (ref 8–23)
CO2: 24 mmol/L (ref 22–32)
Calcium: 8.8 mg/dL — ABNORMAL LOW (ref 8.9–10.3)
Chloride: 106 mmol/L (ref 98–111)
Creatinine, Ser: 0.62 mg/dL (ref 0.44–1.00)
GFR, Estimated: >60 mL/min (ref >60)
Glucose, Bld: 144 mg/dL — ABNORMAL HIGH (ref 70–99)
Potassium: 2.9 mmol/L — ABNORMAL LOW (ref 3.5–5.1)
Sodium: 138 mmol/L (ref 135–145)
Total Bilirubin: 1.5 mg/dL — ABNORMAL HIGH (ref 0.3–1.2)
Total Protein: 6.5 g/dL (ref 6.5–8.1)

## 2020-12-31 LAB — DIFFERENTIAL
Abs Immature Granulocytes: 0.03 10*3/uL (ref 0.00–0.07)
Basophils Absolute: 0 10*3/uL (ref 0.0–0.1)
Basophils Relative: 0 %
Eosinophils Absolute: 0 10*3/uL (ref 0.0–0.5)
Eosinophils Relative: 0 %
Immature Granulocytes: 0 %
Lymphocytes Relative: 25 %
Lymphs Abs: 1.7 10*3/uL (ref 0.7–4.0)
Monocytes Absolute: 0.7 10*3/uL (ref 0.1–1.0)
Monocytes Relative: 10 %
Neutro Abs: 4.4 10*3/uL (ref 1.7–7.7)
Neutrophils Relative %: 65 %

## 2020-12-31 LAB — CBC
HCT: 34.8 % — ABNORMAL LOW (ref 36.0–46.0)
Hemoglobin: 12.9 g/dL (ref 12.0–15.0)
MCH: 33.6 pg (ref 26.0–34.0)
MCHC: 37.1 g/dL — ABNORMAL HIGH (ref 30.0–36.0)
MCV: 90.6 fL (ref 80.0–100.0)
Platelets: 153 10*3/uL (ref 150–400)
RBC: 3.84 MIL/uL — ABNORMAL LOW (ref 3.87–5.11)
RDW: 14.6 % (ref 11.5–15.5)
WBC: 6.9 10*3/uL (ref 4.0–10.5)
nRBC: 0.6 % — ABNORMAL HIGH (ref 0.0–0.2)

## 2020-12-31 LAB — APTT: aPTT: 30 seconds (ref 24–36)

## 2020-12-31 LAB — PROTIME-INR
INR: 1.1 (ref 0.8–1.2)
Prothrombin Time: 14.1 seconds (ref 11.4–15.2)

## 2020-12-31 LAB — POCT I-STAT CREATININE: Creatinine, Ser: 0.7 mg/dL (ref 0.44–1.00)

## 2020-12-31 LAB — CBG MONITORING, ED: Glucose-Capillary: 158 mg/dL — ABNORMAL HIGH (ref 70–99)

## 2020-12-31 MED ORDER — DOXYCYCLINE HYCLATE 100 MG PO TABS
100.0000 mg | ORAL_TABLET | Freq: Once | ORAL | Status: AC
  Administered 2020-12-31: 100 mg via ORAL
  Filled 2020-12-31: qty 1

## 2020-12-31 MED ORDER — LEVETIRACETAM IN NACL 1000 MG/100ML IV SOLN
1000.0000 mg | Freq: Once | INTRAVENOUS | Status: AC
  Administered 2020-12-31: 1000 mg via INTRAVENOUS
  Filled 2020-12-31: qty 100

## 2020-12-31 MED ORDER — SODIUM CHLORIDE 0.9% FLUSH
3.0000 mL | Freq: Once | INTRAVENOUS | Status: AC
  Administered 2020-12-31: 3 mL via INTRAVENOUS

## 2020-12-31 MED ORDER — DOXYCYCLINE HYCLATE 100 MG PO CAPS: 100.0000 mg | ORAL_CAPSULE | Freq: Two times a day (BID) | ORAL | 0 refills | Status: AC

## 2020-12-31 MED ORDER — PREDNISONE 20 MG PO TABS: 40.0000 mg | ORAL_TABLET | Freq: Every day | ORAL | 0 refills | Status: AC

## 2020-12-31 MED ORDER — IOHEXOL 350 MG/ML SOLN
100.0000 mL | Freq: Once | INTRAVENOUS | Status: AC | PRN
  Administered 2020-12-31: 100 mL via INTRAVENOUS

## 2020-12-31 MED ORDER — POTASSIUM CHLORIDE CRYS ER 20 MEQ PO TBCR
40.0000 meq | EXTENDED_RELEASE_TABLET | Freq: Once | ORAL | Status: AC
  Administered 2020-12-31: 40 meq via ORAL
  Filled 2020-12-31: qty 2

## 2020-12-31 MED ORDER — IPRATROPIUM-ALBUTEROL 0.5-2.5 (3) MG/3ML IN SOLN
3.0000 mL | Freq: Once | RESPIRATORY_TRACT | Status: AC
  Administered 2020-12-31: 3 mL via RESPIRATORY_TRACT
  Filled 2020-12-31: qty 3

## 2020-12-31 MED ORDER — LEVETIRACETAM 500 MG PO TABS: 500.0000 mg | ORAL_TABLET | Freq: Two times a day (BID) | ORAL | 0 refills | Status: DC

## 2020-12-31 MED ORDER — PREDNISONE 20 MG PO TABS
40.0000 mg | ORAL_TABLET | Freq: Once | ORAL | Status: AC
  Administered 2020-12-31: 40 mg via ORAL
  Filled 2020-12-31: qty 2

## 2020-12-31 NOTE — ED Provider Notes (Signed)
[facility] Emergency Department Provider Note  ____________________________________________   Event Date/Time   First MD Initiated Contact with Patient 12/31/20 1526     (approximate)  I have reviewed the triage vital signs and the nursing notes.   HISTORY  Chief Complaint Code Stroke (Pt BIB EMS for stroke sx's. LKW 1200. Difficulty speaking & difficulty walking. Pt able to speak full sentences now. Hx of stroke 2 yrs ago. )    HPI Molly Matthews is a 70 y.o. female with past medical history as below including history of stroke here with aphasia.  The patient reportedly was in her usual state of health until just prior to arrival.  At around 2 or 230 this afternoon, she began to be confused and appeared to not be able to understand what her husband was saying.  She was slightly confused and calling things in the home something that they were not.  Of note, this occurs in the setting of multiple, recurrent episodes of intermittent confusion as well as right hand twitching accompanying this.  These have been ongoing for several weeks.  They last 1 to 2 minutes at a time.  She thought it was related to her carotid occlusion so has not sought care for it.  She currently feels back to her baseline.  She arrived as a code stroke and neurology has evaluated.  Denies any complaints currently.  No headaches.  No recent medication changes.    Past Medical History:  Diagnosis Date   Anginal pain ([place])    Anxiety    Aortic atherosclerosis ([place])    Arthritis    Cancer ([place]) 01/2017   Skin cancer on right leg   Carotid artery stenosis    Carotid stenosis, left    100 % blockage   Cholelithiasis    Chronic enteritis    Chronic venous insufficiency    Colon polyps    COPD (chronic obstructive pulmonary disease) (HCC)    mild   Coronary artery disease    Depression    Diastasis recti    Diverticulosis    Diverticulosis    Dyspnea    Erosive esophagitis     Esophagitis    Fatty liver    Fatty liver disease, nonalcoholic    Gastritis    GERD (gastroesophageal reflux disease)    Headache    Herniated lumbar intervertebral disc    Herniated lumbar intervertebral disc    Hyperlipemia    Hyperlipidemia    Hypertension    IBS (irritable bowel syndrome)    Left carotid artery stenosis    Osteopenia    Osteopenia    Peptic ulcer disease    Personal history of tobacco use, presenting hazards to health 03/14/2016   SCC (squamous cell carcinoma)    Stroke ([place])    TIA's X 2   Tobacco dependence    Vitamin D deficiency     Patient Active Problem List   Diagnosis Date Noted   Atherosclerosis of native arteries of extremity with intermittent claudication ([place]) 12/07/2020   Nausea and vomiting 09/30/2020   Stable angina pectoris ([place]) 06/15/2020   SOBOE (shortness of breath on exertion) 01/28/2020   Chronic venous insufficiency 09/04/2019   Hoarseness 01/19/2019   Thoracic aortic aneurysm without rupture ([place]) 04/19/2018   Vitamin D insufficiency 04/19/2018   Recurrent major depression in partial remission ([place]) 06/29/2017   SCC (squamous cell carcinoma) 03/20/2017   Skin lesion 03/13/2017   Precordial pain 01/04/2017   Current  use of long term anticoagulation 09/14/2016   Carotid stenosis 08/24/2016   Hypertension 08/24/2016   COPD mixed type ([place]) 08/24/2016   Hyperlipidemia 08/24/2016   Fatigue 07/17/2016   Headache disorder 07/17/2016   Occlusion of left carotid artery 06/22/2016   Personal history of tobacco use, presenting hazards to health 03/14/2016   Presbyesophagus 11/24/2015   Diastasis recti 11/11/2014   Herniated lumbar intervertebral disc 11/11/2014   Candidal vaginitis 11/09/2014   Epigastric pain 09/22/2014   Viral URI with cough 06/03/2014   Depression 05/04/2014   Personal history of transient ischemic attack (TIA), and cerebral infarction without residual deficits 03/05/2014   Pruritus 03/05/2014   Hiatal  hernia with gastroesophageal reflux 01/15/2014   Cholelithiasis 12/16/2013   Fatty (change of) liver, not elsewhere classified 12/16/2013   Cerebrovascular accident (CVA) ([place]) 12/03/2013   Nicotine dependence, uncomplicated [id number]   Hemorrhoids 11/18/2013   Pain of left sacroiliac joint 08/21/2013   Allergic rhinitis 06/30/2013   Chronic low back pain 12/23/2012   HNP (herniated nucleus pulposus), lumbar 12/23/2012   Osteopenia 09/18/2012    Past Surgical History:  Procedure Laterality Date   ABDOMINAL HYSTERECTOMY     BREAST BIOPSY Left    negative 2008   CAROTID ENDARTERECTOMY     CHOLECYSTECTOMY     COLONOSCOPY W/ POLYPECTOMY     COLONOSCOPY WITH PROPOFOL N/A 07/26/2020   Procedure: COLONOSCOPY WITH PROPOFOL;  Surgeon: Toledo, Benay Pike, MD;  Location: [facility] ENDOSCOPY;  Service: Gastroenterology;  Laterality: N/A;   DILATION AND CURETTAGE OF UTERUS     ESOPHAGOGASTRODUODENOSCOPY (EGD) WITH PROPOFOL N/A 01/26/2017   Procedure: ESOPHAGOGASTRODUODENOSCOPY (EGD) WITH PROPOFOL;  Surgeon: Toledo, Benay Pike, MD;  Location: [facility] ENDOSCOPY;  Service: Gastroenterology;  Laterality: N/A;   ESOPHAGOGASTRODUODENOSCOPY (EGD) WITH PROPOFOL N/A 07/26/2020   Procedure: ESOPHAGOGASTRODUODENOSCOPY (EGD) WITH PROPOFOL;  Surgeon: Toledo, Benay Pike, MD;  Location: [facility] ENDOSCOPY;  Service: Gastroenterology;  Laterality: N/A;   HEMORRHOID SURGERY N/A 07/25/2016   Procedure: HEMORRHOIDECTOMY INTERNAL AND EXTERNAL;  Surgeon: Leonie Green, MD;  Location: [facility] ORS;  Service: General;  Laterality: N/A;   MICROLARYNGOSCOPY N/A 01/29/2019   Procedure: MICROLARYNGOSCOPY with Vocal Cord polyp removal.   No laser is needed.;  Surgeon: Clyde Canterbury, MD;  Location: [facility] ORS;  Service: ENT;  Laterality: N/A;   VASCULAR SURGERY Right    Carotid Endarterectomy     Prior to Admission medications   Medication Sig Start Date End Date Taking? Authorizing Provider  doxycycline (VIBRAMYCIN) 100 MG capsule Take 1  capsule (100 mg total) by mouth 2 (two) times daily for 7 days. 12/31/20 01/07/21 Yes Duffy Bruce, MD  levETIRAcetam (KEPPRA) 500 MG tablet Take 1 tablet (500 mg total) by mouth 2 (two) times daily. 12/31/20  Yes Duffy Bruce, MD  predniSONE (DELTASONE) 20 MG tablet Take 2 tablets (40 mg total) by mouth daily for 5 days. 12/31/20 01/05/21 Yes Duffy Bruce, MD  acetaminophen (TYLENOL) 500 MG tablet Take 1,000 mg by mouth every 6 (six) hours as needed.    [provider]  albuterol (PROVENTIL HFA;VENTOLIN HFA) 108 (90 Base) MCG/ACT inhaler Inhale 2 puffs into the lungs every 6 (six) hours as needed for wheezing or shortness of breath.    [provider]  citalopram (CELEXA) 40 MG tablet Take 40 mg by mouth at bedtime.     [provider]  clopidogrel (PLAVIX) 75 MG tablet Take by mouth. 03/24/19   [provider]  ergocalciferol (VITAMIN D2) 1.25 MG (50000 UT) capsule Take 50,000  Units by mouth once a week.    [provider]  pantoprazole (PROTONIX) 40 MG tablet Take 40 mg by mouth daily.    [provider]  rosuvastatin (CRESTOR) 5 MG tablet Take 1/2  tab by mouth every other day M, W, F 04/19/18   [provider]  umeclidinium-vilanterol (ANORO ELLIPTA) 62.5-25 MCG/INH AEPB Inhale 1 puff into the lungs daily.    [provider]    Allergies Buspirone, Chantix [varenicline tartrate], Statins, Zithromax [azithromycin], and Elemental sulfur  Family History  Problem Relation Age of Onset   Breast cancer Mother 41   Atrial fibrillation Mother    Breast cancer Sister 35   Heart attack Father    Colon cancer Brother    Lung cancer Brother     Social History Social History   Tobacco Use   Smoking status: Every Day    Packs/day: 1.00    Years: 45.00    Pack years: 45.00    Types: Cigarettes   Smokeless tobacco: Never  Vaping Use   Vaping Use: Never used  Substance Use Topics   Alcohol use: No   Drug use: No     Review of Systems  Review of Systems  Constitutional:  Negative for fatigue and fever.  HENT:  Negative for congestion and sore throat.   Eyes:  Negative for visual disturbance.  Respiratory:  Negative for cough and shortness of breath.   Cardiovascular:  Negative for chest pain.  Gastrointestinal:  Negative for abdominal pain, diarrhea, nausea and vomiting.  Genitourinary:  Negative for flank pain.  Musculoskeletal:  Negative for back pain and neck pain.  Skin:  Negative for rash and wound.  Neurological:  Positive for weakness.  Psychiatric/Behavioral:  Positive for confusion.     ____________________________________________  PHYSICAL EXAM:      VITAL SIGNS: ED Triage Vitals  Enc Vitals Group     BP 12/31/20 1539 (!) 141/61     Pulse Rate 12/31/20 1539 73     Resp 12/31/20 1539 15     Temp 12/31/20 1539 98.5 F (36.9 C)     Temp Source 12/31/20 1539 Oral     SpO2 12/31/20 1536 95 %     Weight 12/31/20 1541 145 lb (65.8 kg)     Height 12/31/20 1541 '5\' 2"'$  (1.575 m)     Head Circumference --      Peak Flow --      Pain Score 12/31/20 1540 8     Pain Loc --      Pain Edu? --      Excl. in [place]? --      Physical Exam Vitals and nursing note reviewed.  Constitutional:      General: She is not in acute distress.    Appearance: She is well-developed.  HENT:     Head: Normocephalic and atraumatic.  Eyes:     Conjunctiva/sclera: Conjunctivae normal.  Cardiovascular:     Rate and Rhythm: Normal rate and regular rhythm.     Heart sounds: Normal heart sounds. No murmur heard.   No friction rub.  Pulmonary:     Effort: Pulmonary effort is normal. No respiratory distress.     Breath sounds: Wheezing (mild, expiratory only) present. No rales.  Abdominal:     General: There is no distension.     Palpations: Abdomen is soft.     Tenderness: There is no abdominal tenderness.  Musculoskeletal:     Cervical back: Neck supple.  Skin:  General: Skin is warm.      Capillary Refill: Capillary refill takes less than 2 seconds.  Neurological:     Mental Status: She is alert and oriented to person, place, and time.     GCS: GCS eye subscore is 4. GCS verbal subscore is 5. GCS motor subscore is 6.     Cranial Nerves: Cranial nerves are intact.     Sensory: Sensation is intact.     Motor: Motor function is intact. No abnormal muscle tone.      ____________________________________________   LABS (all labs ordered are listed, but only abnormal results are displayed)  Labs Reviewed  CBC - Abnormal; Notable for the following components:      Result Value   RBC 3.84 (*)    HCT 34.8 (*)    MCHC 37.1 (*)    nRBC 0.6 (*)    All other components within normal limits  COMPREHENSIVE METABOLIC PANEL - Abnormal; Notable for the following components:   Potassium 2.9 (*)    Glucose, Bld 144 (*)    Calcium 8.8 (*)    Total Bilirubin 1.5 (*)    All other components within normal limits  CBG MONITORING, ED - Abnormal; Notable for the following components:   Glucose-Capillary 158 (*)    All other components within normal limits  RESP PANEL BY RT-PCR (FLU A&B, COVID) ARPGX2  PROTIME-INR  APTT  DIFFERENTIAL  URINALYSIS, ROUTINE W REFLEX MICROSCOPIC  MAGNESIUM  I-STAT CREATININE, ED    ____________________________________________  EKG: Normal sinus rhythm, ventricular rate 76.  PR 156, QRS 89, QTc 42.  T wave inversions in the lateral leads and inferior leads, not significantly changed from prior. ________________________________________  RADIOLOGY All imaging, including plain films, CT scans, and ultrasounds, independently reviewed by me, and interpretations confirmed via formal radiology reads.  ED MD interpretation:   CT head: No acute intracranial abnormality, old infarct noted Chest x-ray: Interval development of subtle bibasilar infiltrates  Official radiology report(s): MR ANGIO HEAD WO CONTRAST  Result Date: 12/31/2020 CLINICAL DATA:  Neuro  deficit, acute, stroke suspected EXAM: MRI HEAD WITHOUT CONTRAST MRA HEAD WITHOUT CONTRAST TECHNIQUE: Multiplanar, multi-echo pulse sequences of the brain and surrounding structures were acquired without intravenous contrast. Angiographic images of the Circle of Willis were acquired using MRA technique without intravenous contrast. COMPARISON:  CT head from the same day.  MRI head 11/25/2013. FINDINGS: MRI HEAD FINDINGS Brain: Remote left frontal infarct with encephalomalacia and surrounding gliosis. Mild hemosiderin staining in this region. No acute infarct. Additional advanced supratentorial and pontine T2 hyperintensity, nonspecific but compatible with chronic microvascular ischemic disease. No hydrocephalus, mass lesion, midline shift, or extra-axial fluid collection. Vascular: See below. Skull and upper cervical spine: Normal marrow signal. Sinuses/Orbits: Moderate ethmoid air cell and sphenoid sinus mucosal thickening. Mild maxillary sinus and frontal sinus mucosal thickening. Other: No mastoid effusions. MRA HEAD FINDINGS Motion limited evaluation.  Within this limitation: Anterior circulation: Chronically occluded left ICA. Paraclinoid reconstitution with patent left MCA and A1 ACA. No proximal high-grade stenosis of the left M1 MCA. Small/attenuated left MCA branch vessels in the region of prior infarct. Motion limited evaluation ACAs with possible moderate left A2 ACA stenosis. Right MCA is patent without proximal high-grade stenosis. Posterior circulation: Small left intradural vertebral artery with suspected severe stenosis versus occlusion distally. Dominant right intradural vertebral artery without high-grade stenosis. Basilar artery and bilateral posterior cerebral arteries are patent. Moderate right P2 PCA stenosis. No proximal high-grade stenosis of the left PCA. IMPRESSION:  MRI: 1. No evidence of acute intracranial abnormality. Specifically, no acute infarct. 2. Remote left frontal lobe infarct. 3.  Advanced chronic microvascular ischemic disease. 4. Mild-to-moderate paranasal sinus mucosal thickening. MRA: 1. Chronically occluded left ICA with paraclinoid reconstitution and patent left MCA and ACA. Small/attenuated left MCA branch vessels in the region of prior infarct. 2. Small left intradural vertebral artery with suspected severe stenosis versus occlusion distally. 3. Moderate right P2 PCA stenosis. 4. Motion limited evaluation with possible moderate left A2 ACA stenosis. Electronically Signed   By: Margaretha Sheffield M.D.   On: 12/31/2020 19:52   MR BRAIN WO CONTRAST  Result Date: 12/31/2020 CLINICAL DATA:  Neuro deficit, acute, stroke suspected EXAM: MRI HEAD WITHOUT CONTRAST MRA HEAD WITHOUT CONTRAST TECHNIQUE: Multiplanar, multi-echo pulse sequences of the brain and surrounding structures were acquired without intravenous contrast. Angiographic images of the Circle of Willis were acquired using MRA technique without intravenous contrast. COMPARISON:  CT head from the same day.  MRI head 11/25/2013. FINDINGS: MRI HEAD FINDINGS Brain: Remote left frontal infarct with encephalomalacia and surrounding gliosis. Mild hemosiderin staining in this region. No acute infarct. Additional advanced supratentorial and pontine T2 hyperintensity, nonspecific but compatible with chronic microvascular ischemic disease. No hydrocephalus, mass lesion, midline shift, or extra-axial fluid collection. Vascular: See below. Skull and upper cervical spine: Normal marrow signal. Sinuses/Orbits: Moderate ethmoid air cell and sphenoid sinus mucosal thickening. Mild maxillary sinus and frontal sinus mucosal thickening. Other: No mastoid effusions. MRA HEAD FINDINGS Motion limited evaluation.  Within this limitation: Anterior circulation: Chronically occluded left ICA. Paraclinoid reconstitution with patent left MCA and A1 ACA. No proximal high-grade stenosis of the left M1 MCA. Small/attenuated left MCA branch vessels in the region  of prior infarct. Motion limited evaluation ACAs with possible moderate left A2 ACA stenosis. Right MCA is patent without proximal high-grade stenosis. Posterior circulation: Small left intradural vertebral artery with suspected severe stenosis versus occlusion distally. Dominant right intradural vertebral artery without high-grade stenosis. Basilar artery and bilateral posterior cerebral arteries are patent. Moderate right P2 PCA stenosis. No proximal high-grade stenosis of the left PCA. IMPRESSION: MRI: 1. No evidence of acute intracranial abnormality. Specifically, no acute infarct. 2. Remote left frontal lobe infarct. 3. Advanced chronic microvascular ischemic disease. 4. Mild-to-moderate paranasal sinus mucosal thickening. MRA: 1. Chronically occluded left ICA with paraclinoid reconstitution and patent left MCA and ACA. Small/attenuated left MCA branch vessels in the region of prior infarct. 2. Small left intradural vertebral artery with suspected severe stenosis versus occlusion distally. 3. Moderate right P2 PCA stenosis. 4. Motion limited evaluation with possible moderate left A2 ACA stenosis. Electronically Signed   By: Margaretha Sheffield M.D.   On: 12/31/2020 19:52   CT ABDOMEN PELVIS W CONTRAST  Result Date: 12/31/2020 CLINICAL DATA:  Nausea, vomiting, diarrhea, generalized abdominal pain. EXAM: CT ABDOMEN AND PELVIS WITH CONTRAST TECHNIQUE: Multidetector CT imaging of the abdomen and pelvis was performed using the standard protocol following bolus administration of intravenous contrast. CONTRAST:  13m OMNIPAQUE IOHEXOL 350 MG/ML SOLN COMPARISON:  01/22/2017 FINDINGS: Lower chest: The visualized lung bases are clear. The visualized heart and pericardium are unremarkable. Hepatobiliary: Moderate hepatic steatosis. Cholecystectomy has been performed. No intra or extrahepatic biliary ductal dilation. Pancreas: Unremarkable Spleen: Unremarkable Adrenals/Urinary Tract: The adrenal glands are unremarkable.  The kidneys are normal in size and position. Tiny cortical hypodensities within the left kidney may represent tiny cortical cysts but are too small to accurately characterize on this examination. These appear stable since prior examination.  Probable vascular calcifications within the left renal hilum. The kidneys are otherwise unremarkable. The bladder is unremarkable. Stomach/Bowel: Stomach is within normal limits. Appendix appears normal. No evidence of bowel wall thickening, distention, or inflammatory changes. No free intraperitoneal gas or fluid. Vascular/Lymphatic: Moderate mixed atherosclerotic plaque is identified within the aortic and iliofemoral vasculature. No aortic aneurysm. Particularly prominent atherosclerotic plaque is seen within the proximal superior mesenteric artery and inferior mesenteric artery, however, the degree of stenosis is not well assessed on this non arteriographic examination. Retroaortic left renal vein noted. No pathologic adenopathy within the abdomen and pelvis. Reproductive: Status post hysterectomy. The residual ovaries are unremarkable. No adnexal masses. Other: Tiny fat containing umbilical hernia. The rectum is unremarkable. Musculoskeletal: Degenerative changes are seen within the lumbar spine. No lytic or blastic bone lesions. Remote L1 superior endplate fracture is again noted. No acute bone abnormality. IMPRESSION: No acute intra-abdominal pathology identified. No definite radiographic explanation for the patient's reported symptoms. Moderate hepatic steatosis, stable since prior examination. Peripheral vascular disease. Moderate atherosclerotic plaque noted at the origin of the mesenteric vasculature. If there is clinical evidence of chronic mesenteric ischemia, CT arteriography may be helpful to more accurately characterize the degree of stenosis. Aortic Atherosclerosis (ICD10-I70.0). Electronically Signed   By: Fidela Salisbury M.D.   On: 12/31/2020 19:14   DG Chest  Portable 1 View  Result Date: 12/31/2020 CLINICAL DATA:  Dyspnea, dizziness, code stroke EXAM: PORTABLE CHEST 1 VIEW COMPARISON:  None. FINDINGS: The lungs are symmetrically well expanded. Mild bibasilar interstitial pulmonary infiltrate has developed since prior examination, possibly infectious or inflammatory in nature. No pneumothorax or pleural effusion. Cardiac size within normal limits. Pulmonary vascularity is normal. No acute bone abnormality. IMPRESSION: Interval development of subtle bibasilar pulmonary infiltrate, possibly infectious or inflammatory in nature. Electronically Signed   By: Fidela Salisbury M.D.   On: 12/31/2020 18:59   CT HEAD CODE STROKE WO CONTRAST  Result Date: 12/31/2020 CLINICAL DATA:  Code stroke. Neuro deficit, acute, stroke suspected. Acute onset of aphasia. EXAM: CT HEAD WITHOUT CONTRAST TECHNIQUE: Contiguous axial images were obtained from the base of the skull through the vertex without intravenous contrast. COMPARISON:  Head MRI 11/25/2013 FINDINGS: Brain: There is no evidence of an acute infarct, intracranial hemorrhage, mass, midline shift, or extra-axial fluid collection. There is a moderate-sized chronic left MCA infarct involving the frontal operculum and insula at the site of the subacute infarct on the 2015 MRI. Patchy hypodensities elsewhere in the cerebral white matter bilaterally are nonspecific but compatible with moderate chronic small vessel ischemic disease and have likely mildly progressed from the prior MRI. Vascular: Calcified atherosclerosis at the skull base. No hyperdense vessel. Skull: No fracture or suspicious osseous lesion. Sinuses/Orbits: Mild bilateral ethmoid sinus mucosal thickening. Clear mastoid air cells. Bilateral cataract extraction. Other: None. ASPECTS [facility] Stroke Program Early CT Score) Not scored given the chronic MCA infarct. IMPRESSION: 1. No evidence of acute intracranial abnormality. 2. Chronic left MCA infarct. 3. Moderate chronic  small vessel ischemic disease. These results were called by telephone at the time of interpretation on 12/31/2020 at 3:33 pm to Dr. Harvest Dark , who verbally acknowledged these results. Electronically Signed   By: Logan Bores M.D.   On: 12/31/2020 15:33    ____________________________________________  PROCEDURES   Procedure(s) performed (including Critical Care):  Procedures  ____________________________________________  INITIAL IMPRESSION / MDM / [place] / ED COURSE  As part of my medical decision making, I reviewed the following data within the  electronic MEDICAL RECORD NUMBER Nursing notes reviewed and incorporated, Old chart reviewed, Notes from prior ED visits, and [place] Controlled Substance Database       *DEOLINDA SCHLESSER was evaluated in Emergency Department on 12/31/2020 for the symptoms described in the history of present illness. She was evaluated in the context of the global COVID-19 pandemic, which necessitated consideration that the patient might be at risk for infection with the SARS-CoV-2 virus that causes COVID-19. Institutional protocols and algorithms that pertain to the evaluation of patients at risk for COVID-19 are in a state of rapid change based on information released by regulatory bodies including the CDC and federal and state organizations. These policies and algorithms were followed during the patient's care in the ED.  Some ED evaluations and interventions may be delayed as a result of limited staffing during the pandemic.*     Medical Decision Making:  70 yo F here with transient confusion, now resolved.  Re: confusion - pt started as code stroke, neuro evaluated and suspects partial seizures. Given keppra load here and will d/c on keppra BID with neuro f/u. MR obtained, reviewed, and shows no acute abnormalities. She has chronic occlusions which are known. Neuro f/u.  Re: cough - suspect bronchitis/COPD exacerbation causing her seizure breakthrough,  in setting of old CVA. No hypoxia here. CXR with bibasilar atelectasis vs consolidation- will cover with prednisone, abx. She has normal WOB. Labs o/w reassuring without signs of sepsis.  Re: hypoK - PO replacement and encouraged replacement at home. Ekg wnl and at baseline.  ____________________________________________  FINAL CLINICAL IMPRESSION(S) / ED DIAGNOSES  Final diagnoses:  Disorientation  Seizure-like activity ([place])  COPD exacerbation ([place])  Low blood potassium     MEDICATIONS GIVEN DURING THIS VISIT:  Medications  sodium chloride flush (NS) 0.9 % injection 3 mL (3 mLs Intravenous Given 12/31/20 1549)  levETIRAcetam (KEPPRA) IVPB 1000 mg/100 mL premix (0 mg Intravenous Stopped 12/31/20 1616)  doxycycline (VIBRA-TABS) tablet 100 mg (100 mg Oral Given 12/31/20 2017)  ipratropium-albuterol (DUONEB) 0.5-2.5 (3) MG/3ML nebulizer solution 3 mL (3 mLs Nebulization Given 12/31/20 2018)  predniSONE (DELTASONE) tablet 40 mg (40 mg Oral Given 12/31/20 2016)  potassium chloride SA (KLOR-CON) CR tablet 40 mEq (40 mEq Oral Given 12/31/20 2017)     ED Discharge Orders          Ordered    predniSONE (DELTASONE) 20 MG tablet  Daily        12/31/20 2023    doxycycline (VIBRAMYCIN) 100 MG capsule  2 times daily        12/31/20 2023    levETIRAcetam (KEPPRA) 500 MG tablet  2 times daily        12/31/20 2023             Note:  This document was prepared using Dragon voice recognition software and may include unintentional dictation errors.   Duffy Bruce, MD 12/31/20 2027

## 2020-12-31 NOTE — Code Documentation (Signed)
Stroke Response Nurse Documentation Code Documentation  Molly Matthews is a 70 y.o. female arriving to [facility] ED via [facility] on 12/31/2020 with past medical hx of CVA. On clopidogrel 75 mg daily. Code stroke was activated by EMS   Patient from home where she was LKW at 1300 and now complaining of difficulty speaking. Per pt she went for abd ct this afternoon and when she returned home she was having difficulty speaking to her husband. Husband called EMS.   Stroke team at the bedside on patient arrival. Labs drawn and patient cleared for CT by Dr. Ellender Hose. Patient to CT with team. NIHSS 1, see documentation for details and code stroke times. Patient with mild difficulty speaking on exam. The following imaging was completed:  CT Head. Patient is not a candidate for IV Thrombolytic due to mild sx's and use of plavix.  Care/Plan: Per Dr Leonel Ramsay pt thought to be having seizures. Pt will stay and have MRI.   Primary ED RN with patient.  Velta Addison Stroke Coordinator

## 2020-12-31 NOTE — Consult Note (Signed)
Neurology Consultation Reason for Consult: Aphasia Referring Physician: Ellender Hose, C  CC: Aphasia  History is obtained from: Patient  HPI: Molly Matthews is a 70 y.o. female with a history of carotid occlusion on the left as well as previous left MCA territory infarct who presents with aphasia.  She was in her normal state of health and in fact had a CT scan of her abdomen earlier today, and thinks that she got back home around two or 230.  She noticed that she was unable to understand anything that her husband was saying, and he called 911 when he could not understand her nor get her to understand him.  Of note, for quite some time now she has been having episodes of right hand twitching accompanied by confusion.  At the twitching last for 1 to 2 minutes, then the confusion gradually improves over a few minutes after that.  She states that she just thought it was something to do with her carotid occlusion, and has never sought care for it.  She states that it happens at least once a week.  There was no definite twitching associated with the episode today.  She also reports that she was slightly lightheaded.  LKW: 1 PM tpa given?: no, resolution of symptoms   ROS: A 14 point ROS was performed and is negative except as noted in the HPI.  Past Medical History:  Diagnosis Date   Anginal pain ([place])    Anxiety    Aortic atherosclerosis ([place])    Arthritis    Cancer ([place]) 01/2017   Skin cancer on right leg   Carotid artery stenosis    Carotid stenosis, left    100 % blockage   Cholelithiasis    Chronic enteritis    Chronic venous insufficiency    Colon polyps    COPD (chronic obstructive pulmonary disease) (HCC)    mild   Coronary artery disease    Depression    Diastasis recti    Diverticulosis    Diverticulosis    Dyspnea    Erosive esophagitis    Esophagitis    Fatty liver    Fatty liver disease, nonalcoholic    Gastritis    GERD (gastroesophageal reflux disease)    Headache     Herniated lumbar intervertebral disc    Herniated lumbar intervertebral disc    Hyperlipemia    Hyperlipidemia    Hypertension    IBS (irritable bowel syndrome)    Left carotid artery stenosis    Osteopenia    Osteopenia    Peptic ulcer disease    Personal history of tobacco use, presenting hazards to health 03/14/2016   SCC (squamous cell carcinoma)    Stroke ([place])    TIA's X 2   Tobacco dependence    Vitamin D deficiency      Family History  Problem Relation Age of Onset   Breast cancer Mother 34   Atrial fibrillation Mother    Breast cancer Sister 110   Heart attack Father    Colon cancer Brother    Lung cancer Brother      Social History:  reports that she has been smoking cigarettes. She has a 45.00 pack-year smoking history. She has never used smokeless tobacco. She reports that she does not drink alcohol and does not use drugs.   Exam: Current vital signs: BP (!) 142/57   Pulse 74   Temp 98.5 F (36.9 C) (Oral)   Resp 15   Ht '5\' 2"'$  (  1.575 m)   Wt 65.8 kg   SpO2 94%   BMI 26.52 kg/m  Vital signs in last 24 hours: Temp:  [98.5 F (36.9 C)] 98.5 F (36.9 C) (09/02 1539) Pulse Rate:  [71-74] 74 (09/02 1600) Resp:  [15-16] 15 (09/02 1600) BP: (141-142)/(57-61) 142/57 (09/02 1600) SpO2:  [94 %-95 %] 94 % (09/02 1600) Weight:  [65.8 kg] 65.8 kg (09/02 1541)   Physical Exam  Constitutional: Appears well-developed and well-nourished.  Psych: Affect appropriate to situation Eyes: No scleral injection HENT: No OP obstruction MSK: no joint deformities.  Cardiovascular: Normal rate and regular rhythm.  Respiratory: Effort normal, non-labored breathing GI: Soft.  No distension. There is no tenderness.  Skin: WDI  Neuro: Mental Status: Patient is awake, alert, she does have a mild expressive aphasia, but is able to well communicate, just with some hesitancy.  She is able to name, and read.   Cranial Nerves: II: Visual Fields are full. Pupils are  equal, round, and reactive to light.   III,IV, VI: EOMI without ptosis or diploplia.  V: Facial sensation is symmetric to temperature VII: Facial movement with questionable mild right facial weakness VIII: hearing is intact to voice X: Uvula elevates symmetrically XI: Shoulder shrug is symmetric. XII: tongue is midline without atrophy or fasciculations.  Motor: Tone is normal. Bulk is normal. 5/5 strength was present in all four extremities.  Sensory: Sensation is symmetric to light touch and temperature in the arms and legs.Cerebellar: No clear ataxia      I have reviewed labs in epic and the results pertinent to this consultation are: Creatinine 0.62 Sodium 138  I have reviewed the images obtained: CT head-large previous stroke, no acute findings  Impression: 70 year old female with an episode of aphasia in the setting of carotid occlusion.  Her description of these episodes happening at least once a week of focal motor twitching lasting 1 to 2 minutes associated with confusion are highly concerning for partial seizures.    In this setting, I think that seizure today could certainly be possible.  With her dizziness, transient hypotension could also have played a role given her carotid occlusion.  Also would check a magnesium, given that she has hypokalemia and this can contribute to hypotension.  I do think that an MRI is reasonable, but if this is negative, I feel like there would be fairly limited benefit for TIA admission.  I did strongly recommend smoking cessation.  Given the history of these recurrent stereotyped episodes of twitching with altered mental status in the setting of her previous cortical infarct, I would favor starting antiepileptic therapy.  Recommendations: 1) MRI brain, MRA head 2) Keppra 500 mg twice daily 3) orthostatic vital signs 4) further work-up only if significant findings on MRI/MRA.  Roland Rack, MD [facility] [phone]  If 7pm- 7am, please page neurology on call as listed in [place].

## 2020-12-31 NOTE — Progress Notes (Signed)
   12/31/20 1520  Clinical Encounter Type  Visited With Patient not available  Visit Type Initial;Spiritual support;Social support;Code  Referral From Nurse  Consult/Referral To Chaplain   Chaplain responded to Code Stroke page. PT was being examined by medical staff when the chaplain arrived. Chaplain will attempt to follow up later.    Andee Poles, M. Div.

## 2020-12-31 NOTE — Discharge Instructions (Addendum)
For your episodes: Take Keppra twice a day, starting tomorrow morning  For your cough: Take the prednisone and antibiotic  For your low potassium: You were given potassium here I'd recommend eating foods high in potassium such as oranges/fruit, esp bananas, daily fort he next several days

## 2021-03-29 ENCOUNTER — Other Ambulatory Visit: Payer: Self-pay | Admitting: Physician Assistant

## 2021-03-29 ENCOUNTER — Ambulatory Visit
Admission: RE | Admit: 2021-03-29 | Discharge: 2021-03-29 | Disposition: A | Discharge status: Home / Self Care | Payer: Medicare Other | Source: Ambulatory Visit | Attending: Physician Assistant | Admitting: Physician Assistant

## 2021-03-29 ENCOUNTER — Ambulatory Visit
Admission: RE | Admit: 2021-03-29 | Discharge: 2021-03-29 | Disposition: A | Discharge status: Home / Self Care | Payer: Medicare Other | Attending: Physician Assistant | Admitting: Physician Assistant

## 2021-03-29 DIAGNOSIS — J441 Chronic obstructive pulmonary disease with (acute) exacerbation: Secondary | ICD-10-CM | POA: Insufficient documentation

## 2021-04-04 ENCOUNTER — Ambulatory Visit
Admission: RE | Admit: 2021-04-04 | Discharge: 2021-04-04 | Disposition: A | Discharge status: Home / Self Care | Payer: Medicare Other | Source: Ambulatory Visit | Attending: Family Medicine | Admitting: Family Medicine

## 2021-04-04 ENCOUNTER — Other Ambulatory Visit: Payer: Self-pay

## 2021-04-04 DIAGNOSIS — Z1231 Encounter for screening mammogram for malignant neoplasm of breast: Secondary | ICD-10-CM | POA: Insufficient documentation

## 2021-04-18 ENCOUNTER — Other Ambulatory Visit: Payer: Self-pay | Admitting: *Deleted

## 2021-04-18 DIAGNOSIS — Z87891 Personal history of nicotine dependence: Secondary | ICD-10-CM

## 2021-04-18 DIAGNOSIS — F17210 Nicotine dependence, cigarettes, uncomplicated: Secondary | ICD-10-CM

## 2021-04-27 ENCOUNTER — Ambulatory Visit
Admission: RE | Admit: 2021-04-27 | Discharge: 2021-04-27 | Disposition: A | Discharge status: Home / Self Care | Payer: Medicare Other | Source: Ambulatory Visit | Attending: Acute Care | Admitting: Acute Care

## 2021-04-27 ENCOUNTER — Other Ambulatory Visit: Payer: Self-pay

## 2021-04-27 DIAGNOSIS — F17210 Nicotine dependence, cigarettes, uncomplicated: Secondary | ICD-10-CM | POA: Diagnosis present

## 2021-04-27 DIAGNOSIS — Z87891 Personal history of nicotine dependence: Secondary | ICD-10-CM | POA: Insufficient documentation

## 2021-04-28 ENCOUNTER — Ambulatory Visit: Payer: Medicare Other

## 2021-04-29 ENCOUNTER — Other Ambulatory Visit: Payer: Self-pay | Admitting: Acute Care

## 2021-04-29 DIAGNOSIS — F17210 Nicotine dependence, cigarettes, uncomplicated: Secondary | ICD-10-CM

## 2021-04-29 DIAGNOSIS — Z87891 Personal history of nicotine dependence: Secondary | ICD-10-CM

## 2021-07-26 ENCOUNTER — Other Ambulatory Visit: Payer: Self-pay

## 2021-07-26 ENCOUNTER — Ambulatory Visit
Admission: RE | Admit: 2021-07-26 | Discharge: 2021-07-26 | Disposition: A | Discharge status: Home / Self Care | Payer: Medicare Other | Source: Ambulatory Visit | Attending: Physician Assistant | Admitting: Physician Assistant

## 2021-07-26 ENCOUNTER — Other Ambulatory Visit (HOSPITAL_COMMUNITY): Payer: Self-pay | Admitting: Physician Assistant

## 2021-07-26 ENCOUNTER — Other Ambulatory Visit: Payer: Self-pay | Admitting: Physician Assistant

## 2021-07-26 DIAGNOSIS — F17200 Nicotine dependence, unspecified, uncomplicated: Secondary | ICD-10-CM

## 2021-07-26 DIAGNOSIS — M79662 Pain in left lower leg: Secondary | ICD-10-CM | POA: Insufficient documentation

## 2021-07-26 DIAGNOSIS — M79661 Pain in right lower leg: Secondary | ICD-10-CM | POA: Diagnosis present

## 2021-09-08 ENCOUNTER — Encounter (INDEPENDENT_AMBULATORY_CARE_PROVIDER_SITE_OTHER): Payer: Medicare Other

## 2021-09-08 ENCOUNTER — Other Ambulatory Visit (INDEPENDENT_AMBULATORY_CARE_PROVIDER_SITE_OTHER): Payer: Self-pay | Admitting: Vascular Surgery

## 2021-09-08 ENCOUNTER — Ambulatory Visit (INDEPENDENT_AMBULATORY_CARE_PROVIDER_SITE_OTHER): Payer: Medicare Other | Admitting: Vascular Surgery

## 2021-09-08 DIAGNOSIS — I739 Peripheral vascular disease, unspecified: Secondary | ICD-10-CM

## 2021-09-08 DIAGNOSIS — I679 Cerebrovascular disease, unspecified: Secondary | ICD-10-CM

## 2021-10-13 ENCOUNTER — Ambulatory Visit (INDEPENDENT_AMBULATORY_CARE_PROVIDER_SITE_OTHER): Payer: Medicare Other | Admitting: Vascular Surgery

## 2021-10-13 ENCOUNTER — Encounter (INDEPENDENT_AMBULATORY_CARE_PROVIDER_SITE_OTHER): Payer: Self-pay | Admitting: Vascular Surgery

## 2021-10-13 ENCOUNTER — Ambulatory Visit (INDEPENDENT_AMBULATORY_CARE_PROVIDER_SITE_OTHER): Payer: Medicare Other

## 2021-10-13 VITALS — BP 188/73 | HR 88 | Resp 16 | Wt 149.8 lb

## 2021-10-13 DIAGNOSIS — I679 Cerebrovascular disease, unspecified: Secondary | ICD-10-CM | POA: Diagnosis not present

## 2021-10-13 DIAGNOSIS — I739 Peripheral vascular disease, unspecified: Secondary | ICD-10-CM

## 2021-10-13 DIAGNOSIS — E782 Mixed hyperlipidemia: Secondary | ICD-10-CM

## 2021-10-13 DIAGNOSIS — I7123 Aneurysm of the descending thoracic aorta, without rupture: Secondary | ICD-10-CM | POA: Diagnosis not present

## 2021-10-13 DIAGNOSIS — I6523 Occlusion and stenosis of bilateral carotid arteries: Secondary | ICD-10-CM | POA: Diagnosis not present

## 2021-10-13 DIAGNOSIS — J449 Chronic obstructive pulmonary disease, unspecified: Secondary | ICD-10-CM

## 2021-10-13 DIAGNOSIS — I872 Venous insufficiency (chronic) (peripheral): Secondary | ICD-10-CM

## 2021-10-13 DIAGNOSIS — I70211 Atherosclerosis of native arteries of extremities with intermittent claudication, right leg: Secondary | ICD-10-CM | POA: Diagnosis not present

## 2021-10-13 NOTE — Progress Notes (Signed)
MRN : [id number]  Molly Matthews is a 71 y.o. (Nov 03, 1950) female who presents with chief complaint of check carotid arteries.  History of Present Illness:   The patient is seen for follow up evaluation of carotid stenosis. The carotid stenosis followed by ultrasound.    The patient denies amaurosis fugax. There is no recent history of TIA symptoms or focal motor deficits. There is no prior documented CVA.   The patient is taking enteric-coated aspirin 81 mg daily.   There is no history of migraine headaches. There is no history of seizures.   The patient is also here for followup and review of noninvasive studies of the lower extremities. There have been no interval changes in lower extremity symptoms. No interval shortening of the patient's claudication distance or development of rest pain symptoms. However, she does have occasional episodes of numbness in her right foot which gets better when she stands.  No new ulcers or wounds have occurred since the last visit.   There have been no significant changes to the patient's overall health care.   The patient has a history of coronary artery disease, no recent episodes of angina or shortness of breath. There is a history of hyperlipidemia which is being treated with a statin.     Carotid Duplex done today shows right ICA 1-39% (s/p right CEA) and known occlusion of the left ICA.  No significant change compared to last study in 09/09/2020  ABI's Rt=1.00 and Lt=1.11 (previous ABI Rt=0.98 and Lt=1.15).  CT scanning of the chest for lung cancer screening dated 04/28/2021 is reviewed by me demonstrates a stable saccular descending thoracic aortic aneurysm measuring 3.8 cm in maximal dimension  Current Meds  Medication Sig   acetaminophen (TYLENOL) 500 MG tablet Take 1,000 mg by mouth every 6 (six) hours as needed.   albuterol (PROVENTIL HFA;VENTOLIN HFA) 108 (90 Base) MCG/ACT inhaler Inhale 2 puffs into the lungs every 6 (six) hours as  needed for wheezing or shortness of breath.   clopidogrel (PLAVIX) 75 MG tablet Take by mouth.   escitalopram (LEXAPRO) 20 MG tablet Take 20 mg by mouth daily.   levETIRAcetam (KEPPRA) 500 MG tablet Take 1 tablet (500 mg total) by mouth 2 (two) times daily.   pantoprazole (PROTONIX) 40 MG tablet Take 40 mg by mouth daily.   TRELEGY ELLIPTA 100-62.5-25 MCG/ACT AEPB Inhale 1 puff into the lungs daily.   umeclidinium-vilanterol (ANORO ELLIPTA) 62.5-25 MCG/INH AEPB Inhale 1 puff into the lungs daily.    Past Medical History:  Diagnosis Date   Anginal pain ([place])    Anxiety    Aortic atherosclerosis ([place])    Arthritis    Cancer ([place]) 01/2017   Skin cancer on right leg   Carotid artery stenosis    Carotid stenosis, left    100 % blockage   Cholelithiasis    Chronic enteritis    Chronic venous insufficiency    Colon polyps    COPD (chronic obstructive pulmonary disease) (HCC)    mild   Coronary artery disease    Depression    Diastasis recti    Diverticulosis    Diverticulosis    Dyspnea    Erosive esophagitis    Esophagitis    Fatty liver    Fatty liver disease, nonalcoholic    Gastritis    GERD (gastroesophageal reflux disease)    Headache    Herniated lumbar intervertebral disc    Herniated lumbar intervertebral disc  Hyperlipemia    Hyperlipidemia    Hypertension    IBS (irritable bowel syndrome)    Left carotid artery stenosis    Osteopenia    Osteopenia    Peptic ulcer disease    Personal history of tobacco use, presenting hazards to health 03/14/2016   SCC (squamous cell carcinoma)    Stroke ([place])    TIA's X 2   Tobacco dependence    Vitamin D deficiency     Past Surgical History:  Procedure Laterality Date   ABDOMINAL HYSTERECTOMY     BREAST BIOPSY Left    negative 2008   CAROTID ENDARTERECTOMY     CHOLECYSTECTOMY     COLONOSCOPY W/ POLYPECTOMY     COLONOSCOPY WITH PROPOFOL N/A 07/26/2020   Procedure: COLONOSCOPY WITH PROPOFOL;  Surgeon: Toledo,  Benay Pike, MD;  Location: [facility] ENDOSCOPY;  Service: Gastroenterology;  Laterality: N/A;   DILATION AND CURETTAGE OF UTERUS     ESOPHAGOGASTRODUODENOSCOPY (EGD) WITH PROPOFOL N/A 01/26/2017   Procedure: ESOPHAGOGASTRODUODENOSCOPY (EGD) WITH PROPOFOL;  Surgeon: Toledo, Benay Pike, MD;  Location: [facility] ENDOSCOPY;  Service: Gastroenterology;  Laterality: N/A;   ESOPHAGOGASTRODUODENOSCOPY (EGD) WITH PROPOFOL N/A 07/26/2020   Procedure: ESOPHAGOGASTRODUODENOSCOPY (EGD) WITH PROPOFOL;  Surgeon: Toledo, Benay Pike, MD;  Location: [facility] ENDOSCOPY;  Service: Gastroenterology;  Laterality: N/A;   HEMORRHOID SURGERY N/A 07/25/2016   Procedure: HEMORRHOIDECTOMY INTERNAL AND EXTERNAL;  Surgeon: Leonie Green, MD;  Location: [facility] ORS;  Service: General;  Laterality: N/A;   MICROLARYNGOSCOPY N/A 01/29/2019   Procedure: MICROLARYNGOSCOPY with Vocal Cord polyp removal.   No laser is needed.;  Surgeon: Clyde Canterbury, MD;  Location: [facility] ORS;  Service: ENT;  Laterality: N/A;   VASCULAR SURGERY Right    Carotid Endarterectomy     Social History Social History   Tobacco Use   Smoking status: Every Day    Packs/day: 1.00    Years: 45.00    Total pack years: 45.00    Types: Cigarettes   Smokeless tobacco: Never  Vaping Use   Vaping Use: Never used  Substance Use Topics   Alcohol use: No   Drug use: No    Family History Family History  Problem Relation Age of Onset   Breast cancer Mother 78   Atrial fibrillation Mother    Breast cancer Sister 44   Heart attack Father    Colon cancer Brother    Lung cancer Brother     Allergies  Allergen Reactions   Buspirone     Other reaction(s): Headache   Chantix [Varenicline Tartrate] Other (See Comments)    Jittery muscle movements   Statins Other (See Comments)    Muscle and joint aches - can take in small doses   Sulfa Antibiotics     Other reaction(s): Unknown   Zithromax [Azithromycin] Other (See Comments)    Back Pain   Elemental Sulfur Rash      REVIEW OF SYSTEMS (Negative unless checked)  Constitutional: '[]'$ Weight loss  '[]'$ Fever  '[]'$ Chills Cardiac: '[]'$ Chest pain   '[]'$ Chest pressure   '[]'$ Palpitations   '[]'$ Shortness of breath when laying flat   '[]'$ Shortness of breath with exertion. Vascular:  '[x]'$ Pain in legs with walking   '[]'$ Pain in legs at rest  '[]'$ History of DVT   '[]'$ Phlebitis   '[]'$ Swelling in legs   '[]'$ Varicose veins   '[]'$ Non-healing ulcers Pulmonary:   '[]'$ Uses home oxygen   '[]'$ Productive cough   '[]'$ Hemoptysis   '[]'$ Wheeze  '[]'$ COPD   '[]'$ Asthma Neurologic:  '[]'$ Dizziness   '[]'$ Seizures   '[]'$ History  of stroke   '[]'$ History of TIA  '[]'$ Aphasia   '[]'$ Vissual changes   '[]'$ Weakness or numbness in arm   '[]'$ Weakness or numbness in leg Musculoskeletal:   '[]'$ Joint swelling   '[]'$ Joint pain   '[]'$ Low back pain Hematologic:  '[]'$ Easy bruising  '[]'$ Easy bleeding   '[]'$ Hypercoagulable state   '[]'$ Anemic Gastrointestinal:  '[]'$ Diarrhea   '[]'$ Vomiting  '[]'$ Gastroesophageal reflux/heartburn   '[]'$ Difficulty swallowing. Genitourinary:  '[]'$ Chronic kidney disease   '[]'$ Difficult urination  '[]'$ Frequent urination   '[]'$ Blood in urine Skin:  '[]'$ Rashes   '[]'$ Ulcers  Psychological:  '[]'$ History of anxiety   '[]'$  History of major depression.  Physical Examination  Vitals:   10/13/21 0930  BP: (!) 188/73  Pulse: 88  Resp: 16  Weight: 149 lb 12.8 oz (67.9 kg)   Body mass index is 27.4 kg/m. Gen: WD/WN, NAD Head: [place]/AT, No temporalis wasting.  Ear/Nose/Throat: Hearing grossly intact, nares w/o erythema or drainage Eyes: PER, EOMI, sclera nonicteric.  Neck: Supple, no masses.  No bruit or JVD.  Pulmonary:  Good air movement, no audible wheezing, no use of accessory muscles.  Cardiac: RRR, normal S1, S2, no Murmurs. Vascular:  carotid bruit noted Vessel Right Left  Radial Palpable Palpable  Carotid  Palpable  Palpable  Gastrointestinal: soft, non-distended. No guarding/no peritoneal signs.  Musculoskeletal: M/S 5/5 throughout.  No visible deformity.  Neurologic: CN 2-12 intact. Pain and light touch intact  in extremities.  Symmetrical.  Speech is fluent. Motor exam as listed above. Psychiatric: Judgment intact, Mood & affect appropriate for pt's clinical situation. Dermatologic: No rashes or ulcers noted.  No changes consistent with cellulitis.   CBC Lab Results  Component Value Date   WBC 6.9 12/31/2020   HGB 12.9 12/31/2020   HCT 34.8 (L) 12/31/2020   MCV 90.6 12/31/2020   PLT 153 12/31/2020    BMET    Component Value Date/Time   NA 138 12/31/2020 1540   NA 142 01/29/2014 2124   K 2.9 (L) 12/31/2020 1540   K 4.0 01/29/2014 2124   CL 106 12/31/2020 1540   CL 110 (H) 01/29/2014 2124   CO2 24 12/31/2020 1540   CO2 25 01/29/2014 2124   GLUCOSE 144 (H) 12/31/2020 1540   GLUCOSE 137 (H) 01/29/2014 2124   BUN 8 12/31/2020 1540   BUN 10 01/29/2014 2124   CREATININE 0.62 12/31/2020 1540   CREATININE 1.00 01/29/2014 2124   CALCIUM 8.8 (L) 12/31/2020 1540   CALCIUM 9.3 01/29/2014 2124   GFRNONAA >60 12/31/2020 1540   GFRNONAA 59 (L) 01/29/2014 2124   GFRNONAA >60 12/22/2013 1159   GFRAA >60 04/25/2018 1713   GFRAA >60 01/29/2014 2124   GFRAA >60 12/22/2013 1159   CrCl cannot be calculated (Patient's most recent lab result is older than the maximum 21 days allowed.).  COAG Lab Results  Component Value Date   INR 1.1 12/31/2020    Radiology No results found.   Assessment/Plan 1. Bilateral carotid artery stenosis Recommend:  Given the patient's asymptomatic subcritical stenosis no further invasive testing or surgery at this time.  Carotid Duplex done today shows right ICA 1-39% (s/p right CEA) and known occlusion of the left ICA.  No significant change compared to last study in 09/09/2020  Continue antiplatelet therapy as prescribed Continue management of CAD, HTN and Hyperlipidemia Healthy heart diet,  encouraged exercise at least 4 times per week Follow up in 12 months with duplex ultrasound and physical exam   - VAS US CAROTID; Future  2. Atherosclerosis of  native artery of right lower extremity with intermittent claudication (HCC) Recommend:   I do not find evidence of Vascular pathology that would explain the patient's symptoms.  She does have mild PAD primarily of the right leg.   The patient has atypical pain symptoms for vascular disease   I do not find evidence of Vascular pathology that would explain the patient's symptoms and I suspect the patient is c/o pseudoclaudication.  Patient should have an evaluation of his LS spine which I defer to the primary service.   Noninvasive studies including right leg arterial ultrasound do not identify critical vascular problems   The patient should continue walking and begin a more formal exercise program. The patient should continue his antiplatelet therapy and aggressive treatment of the lipid abnormalities. The patient should begin wearing graduated compression socks 15-20 mmHg strength to control her mild edema.   Patient will follow-up with me as arranged already for May, 2023.  3. Chronic venous insufficiency No surgery or intervention at this point in time.    Patient should continue to wear graduated compression stockings or compression wraps on a daily basis.    In addition, behavioral modification including several periods of elevation of the lower extremities during the day will be continued. I have demonstrated that proper elevation is a position with the ankles at heart level.  The patient is instructed to begin routine exercise, especially walking on a daily basis  The patient will be assessed for a Lymph Pump depending on the effectiveness of conservative therapy and the control of the associated lymphedema.   4. Aneurysm of descending thoracic aorta without rupture (HCC) Recommend:  No surgery or intervention is indicated at this time.  The patient has an asymptomatic thoracic aortic aneurysm that is less than 6.0 cm in maximal diameter.  I have discussed the natural history  of thoracic aortic aneurysm and the small risk of rupture for aneurysm less than 6.5 cm in size.  However, as these small aneurysms tend to enlarge over time, continued surveillance with CT scan is mandatory.   I have also discussed optimizing medical management with hypertension and lipid control and the importance of abstinence from tobacco.  The patient is also encouraged to exercise a minimum of 30 minutes 4 times a week.   Should the patient develop new onset chest or back pain or signs of peripheral embolization they are instructed to seek medical attention immediately and to alert the physician providing care that they have an aneurysm in the chest.   The patient voices their understanding.   5. COPD mixed type ([place]) Continue pulmonary medications and aerosols as already ordered, these medications have been reviewed and there are no changes at this time.    6. Mixed hyperlipidemia Continue statin as ordered and reviewed, no changes at this time     Hortencia Pilar, MD  10/13/2021 9:52 AM

## 2021-11-14 ENCOUNTER — Other Ambulatory Visit: Payer: Self-pay | Admitting: Physician Assistant

## 2021-11-14 DIAGNOSIS — J029 Acute pharyngitis, unspecified: Secondary | ICD-10-CM

## 2021-11-14 DIAGNOSIS — R221 Localized swelling, mass and lump, neck: Secondary | ICD-10-CM

## 2021-11-15 ENCOUNTER — Encounter: Payer: Self-pay | Admitting: Emergency Medicine

## 2021-11-15 ENCOUNTER — Emergency Department: Payer: Medicare Other

## 2021-11-15 ENCOUNTER — Other Ambulatory Visit: Payer: Self-pay

## 2021-11-15 ENCOUNTER — Observation Stay
Admission: EM | Admit: 2021-11-15 | Discharge: 2021-11-16 | Disposition: A | Discharge status: Home / Self Care | Payer: Medicare Other | Attending: Emergency Medicine | Admitting: Emergency Medicine

## 2021-11-15 DIAGNOSIS — J449 Chronic obstructive pulmonary disease, unspecified: Secondary | ICD-10-CM | POA: Diagnosis not present

## 2021-11-15 DIAGNOSIS — I639 Cerebral infarction, unspecified: Principal | ICD-10-CM

## 2021-11-15 DIAGNOSIS — I2510 Atherosclerotic heart disease of native coronary artery without angina pectoris: Secondary | ICD-10-CM | POA: Diagnosis present

## 2021-11-15 DIAGNOSIS — E785 Hyperlipidemia, unspecified: Secondary | ICD-10-CM

## 2021-11-15 DIAGNOSIS — Z85828 Personal history of other malignant neoplasm of skin: Secondary | ICD-10-CM | POA: Insufficient documentation

## 2021-11-15 DIAGNOSIS — F418 Other specified anxiety disorders: Secondary | ICD-10-CM

## 2021-11-15 DIAGNOSIS — K279 Peptic ulcer, site unspecified, unspecified as acute or chronic, without hemorrhage or perforation: Secondary | ICD-10-CM

## 2021-11-15 DIAGNOSIS — I6529 Occlusion and stenosis of unspecified carotid artery: Secondary | ICD-10-CM | POA: Diagnosis present

## 2021-11-15 DIAGNOSIS — F17210 Nicotine dependence, cigarettes, uncomplicated: Secondary | ICD-10-CM | POA: Diagnosis not present

## 2021-11-15 DIAGNOSIS — Z79899 Other long term (current) drug therapy: Secondary | ICD-10-CM | POA: Insufficient documentation

## 2021-11-15 DIAGNOSIS — F32A Depression, unspecified: Secondary | ICD-10-CM | POA: Diagnosis present

## 2021-11-15 DIAGNOSIS — R4701 Aphasia: Secondary | ICD-10-CM | POA: Diagnosis present

## 2021-11-15 DIAGNOSIS — Z7902 Long term (current) use of antithrombotics/antiplatelets: Secondary | ICD-10-CM | POA: Insufficient documentation

## 2021-11-15 DIAGNOSIS — F17200 Nicotine dependence, unspecified, uncomplicated: Secondary | ICD-10-CM

## 2021-11-15 DIAGNOSIS — I10 Essential (primary) hypertension: Secondary | ICD-10-CM | POA: Diagnosis not present

## 2021-11-15 DIAGNOSIS — J441 Chronic obstructive pulmonary disease with (acute) exacerbation: Secondary | ICD-10-CM | POA: Diagnosis present

## 2021-11-15 LAB — DIFFERENTIAL
Abs Immature Granulocytes: 0.02 10*3/uL (ref 0.00–0.07)
Basophils Absolute: 0.1 10*3/uL (ref 0.0–0.1)
Basophils Relative: 1 %
Eosinophils Absolute: 0.3 10*3/uL (ref 0.0–0.5)
Eosinophils Relative: 4 %
Immature Granulocytes: 0 %
Lymphocytes Relative: 26 %
Lymphs Abs: 2.3 10*3/uL (ref 0.7–4.0)
Monocytes Absolute: 0.6 10*3/uL (ref 0.1–1.0)
Monocytes Relative: 7 %
Neutro Abs: 5.7 10*3/uL (ref 1.7–7.7)
Neutrophils Relative %: 62 %

## 2021-11-15 LAB — COMPREHENSIVE METABOLIC PANEL
ALT: 18 U/L (ref 0–44)
AST: 19 U/L (ref 15–41)
Albumin: 4.2 g/dL (ref 3.5–5.0)
Alkaline Phosphatase: 60 U/L (ref 38–126)
Anion gap: 5 (ref 5–15)
BUN: 13 mg/dL (ref 8–23)
CO2: 28 mmol/L (ref 22–32)
Calcium: 9.6 mg/dL (ref 8.9–10.3)
Chloride: 108 mmol/L (ref 98–111)
Creatinine, Ser: 0.68 mg/dL (ref 0.44–1.00)
GFR, Estimated: >60 mL/min (ref >60)
Glucose, Bld: 111 mg/dL — ABNORMAL HIGH (ref 70–99)
Potassium: 4.1 mmol/L (ref 3.5–5.1)
Sodium: 141 mmol/L (ref 135–145)
Total Bilirubin: 1 mg/dL (ref 0.3–1.2)
Total Protein: 7.5 g/dL (ref 6.5–8.1)

## 2021-11-15 LAB — PROTIME-INR
INR: 1 (ref 0.8–1.2)
Prothrombin Time: 13.3 seconds (ref 11.4–15.2)

## 2021-11-15 LAB — CBC
HCT: 47.5 % — ABNORMAL HIGH (ref 36.0–46.0)
Hemoglobin: 16 g/dL — ABNORMAL HIGH (ref 12.0–15.0)
MCH: 31.7 pg (ref 26.0–34.0)
MCHC: 33.7 g/dL (ref 30.0–36.0)
MCV: 94.1 fL (ref 80.0–100.0)
Platelets: 221 10*3/uL (ref 150–400)
RBC: 5.05 MIL/uL (ref 3.87–5.11)
RDW: 15 % (ref 11.5–15.5)
WBC: 9.1 10*3/uL (ref 4.0–10.5)
nRBC: 0 % (ref 0.0–0.2)

## 2021-11-15 LAB — APTT: aPTT: 28 seconds (ref 24–36)

## 2021-11-15 LAB — HEMOGLOBIN A1C
Hgb A1c MFr Bld: 5.1 % (ref 4.8–5.6)
Mean Plasma Glucose: 99.67 mg/dL

## 2021-11-15 LAB — ETHANOL: Alcohol, Ethyl (B): <10 mg/dL (ref <10)

## 2021-11-15 MED ORDER — ACETAMINOPHEN 160 MG/5ML PO SOLN: 650.0000 mg | ORAL | Status: DC | PRN

## 2021-11-15 MED ORDER — ESCITALOPRAM OXALATE 10 MG PO TABS
20.0000 mg | ORAL_TABLET | Freq: Every day | ORAL | Status: DC
  Administered 2021-11-16: 20 mg via ORAL
  Filled 2021-11-15: qty 2

## 2021-11-15 MED ORDER — AMLODIPINE BESYLATE 10 MG PO TABS
10.0000 mg | ORAL_TABLET | Freq: Every day | ORAL | Status: DC
  Administered 2021-11-16: 10 mg via ORAL
  Filled 2021-11-15: qty 1

## 2021-11-15 MED ORDER — LORAZEPAM 0.5 MG PO TABS
0.5000 mg | ORAL_TABLET | Freq: Once | ORAL | Status: AC
  Administered 2021-11-15: 0.5 mg via ORAL
  Filled 2021-11-15: qty 1

## 2021-11-15 MED ORDER — ACETAMINOPHEN 650 MG RE SUPP: 650.0000 mg | RECTAL | Status: DC | PRN

## 2021-11-15 MED ORDER — DM-GUAIFENESIN ER 30-600 MG PO TB12: 1.0000 | ORAL_TABLET | Freq: Two times a day (BID) | ORAL | Status: DC | PRN

## 2021-11-15 MED ORDER — SODIUM CHLORIDE 0.9% FLUSH: 3.0000 mL | Freq: Once | INTRAVENOUS | Status: DC

## 2021-11-15 MED ORDER — CLOPIDOGREL BISULFATE 75 MG PO TABS
75.0000 mg | ORAL_TABLET | Freq: Every day | ORAL | Status: DC
  Administered 2021-11-16: 75 mg via ORAL
  Filled 2021-11-15: qty 1

## 2021-11-15 MED ORDER — NICOTINE 21 MG/24HR TD PT24
21.0000 mg | MEDICATED_PATCH | Freq: Every day | TRANSDERMAL | Status: DC
  Administered 2021-11-15 – 2021-11-16 (×2): 21 mg via TRANSDERMAL
  Filled 2021-11-15 (×2): qty 1

## 2021-11-15 MED ORDER — EZETIMIBE 10 MG PO TABS
10.0000 mg | ORAL_TABLET | Freq: Every day | ORAL | Status: DC
  Administered 2021-11-16: 10 mg via ORAL
  Filled 2021-11-15: qty 1

## 2021-11-15 MED ORDER — ASPIRIN 325 MG PO TABS
325.0000 mg | ORAL_TABLET | Freq: Every day | ORAL | Status: DC
  Administered 2021-11-15 – 2021-11-16 (×2): 325 mg via ORAL
  Filled 2021-11-15 (×2): qty 1

## 2021-11-15 MED ORDER — ENOXAPARIN SODIUM 40 MG/0.4ML IJ SOSY
40.0000 mg | PREFILLED_SYRINGE | INTRAMUSCULAR | Status: DC
Start: 2021-11-15 — End: 2021-11-16
  Administered 2021-11-15: 40 mg via SUBCUTANEOUS
  Filled 2021-11-15: qty 0.4

## 2021-11-15 MED ORDER — ONDANSETRON HCL 4 MG/2ML IJ SOLN: 4.0000 mg | Freq: Three times a day (TID) | INTRAMUSCULAR | Status: DC | PRN

## 2021-11-15 MED ORDER — SENNOSIDES-DOCUSATE SODIUM 8.6-50 MG PO TABS: 1.0000 | ORAL_TABLET | Freq: Every evening | ORAL | Status: DC | PRN

## 2021-11-15 MED ORDER — STROKE: EARLY STAGES OF RECOVERY BOOK: Freq: Once | Status: DC

## 2021-11-15 MED ORDER — ACETAMINOPHEN 325 MG PO TABS
650.0000 mg | ORAL_TABLET | ORAL | Status: DC | PRN
  Administered 2021-11-16: 650 mg via ORAL
  Filled 2021-11-15: qty 2

## 2021-11-15 MED ORDER — HYDRALAZINE HCL 20 MG/ML IJ SOLN
5.0000 mg | INTRAMUSCULAR | Status: DC | PRN
Start: 2021-11-15 — End: 2021-11-16
  Administered 2021-11-15 – 2021-11-16 (×2): 5 mg via INTRAVENOUS
  Filled 2021-11-15 (×2): qty 1

## 2021-11-15 MED ORDER — PANTOPRAZOLE SODIUM 40 MG PO TBEC
40.0000 mg | DELAYED_RELEASE_TABLET | Freq: Every day | ORAL | Status: DC
  Administered 2021-11-16: 40 mg via ORAL
  Filled 2021-11-15: qty 1

## 2021-11-15 MED ORDER — ALBUTEROL SULFATE (2.5 MG/3ML) 0.083% IN NEBU: 3.0000 mL | INHALATION_SOLUTION | RESPIRATORY_TRACT | Status: DC | PRN

## 2021-11-15 MED ORDER — SODIUM CHLORIDE 0.9 % IV SOLN: INTRAVENOUS | Status: DC

## 2021-11-15 NOTE — Assessment & Plan Note (Signed)
-   Continue home medications 

## 2021-11-15 NOTE — Plan of Care (Signed)

## 2021-11-15 NOTE — ED Notes (Signed)
While drinking water, pt had episode of choking. Dr. Blaine Hamper notified. Pt made NPO and SLP eval ordered.

## 2021-11-15 NOTE — Assessment & Plan Note (Signed)
-   Started zetia -f/u FLP

## 2021-11-15 NOTE — ED Triage Notes (Signed)
Patient to ED via POV for slurred speech, dizziness, and headache x3 days. Patient has hx of previous stroke. Per family member this is how patient presented for last stroke. Aox4 in triage. No weakness noted.

## 2021-11-15 NOTE — Assessment & Plan Note (Signed)
-   DId counseling about importance of quitting smoking -Patient promised to quit smoking -Nicotine patch

## 2021-11-15 NOTE — Assessment & Plan Note (Signed)
Protonix.  ?

## 2021-11-15 NOTE — Assessment & Plan Note (Addendum)
MRI of brain and MRA-head and neck showed acute infarction of the right anterior para median pons. Severely disease left common carotid artery. Left internal carotid artery is occluded at its origin. Moderate stenosis of the right ICA in the siphon. Distal vessel intracranial atherosclerotic irregularity diffusely. 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 Dominant right vertebral artery supplies the basilar.  - Admit to tele med bed as inpatient - Patient does not need permissive hypertension since her symptoms have been going on for 3 days - Continue Plavix - Started aspirin 325 mg daily - started Zetia (patient stopped taking Crestor due to muscle pain) - fasting lipid panel and HbA1c  - 2D transthoracic echocardiography  - swallowing screen. If fails, will get SLP - Check UDS  - PT/OT consult

## 2021-11-15 NOTE — Assessment & Plan Note (Signed)
Blood pressure 135/88, 221/68.  Patient's not taking blood pressure medications currently -Start amlodipine 10 mg daily -IV hydralazine as needed

## 2021-11-15 NOTE — Progress Notes (Signed)
Patient and husband requesting an additional swallow screen be performed. Both patient and husband state that previous swallow screen was performed while "laying down flat in the bed." This RN performed swallow screen and patient passed. Hospitalist notified and diet order changed.

## 2021-11-15 NOTE — ED Provider Notes (Signed)
[facility] Provider Note   Event Date/Time   First MD Initiated Contact with Patient 11/15/21 1457     (approximate) History  Aphasia  HPI Molly Matthews is a 71 y.o. female who presents for dizziness, slurred speech, and facial droop that occurred yesterday and has resolved since onset.  Husband at bedside states that patient had unknown last known well for her slurred speech however he states he came home in the afternoon yesterday and found patient to be "talking out of the side of her mouth".  Patient states that she has been experiencing lightheadedness over the last 24 hours that is somewhat resolved at this point.  Husband at bedside states that patient is still slurring her speech however patient's speech sounds clear at this time. ROS: Patient currently denies any vision changes, tinnitus, difficulty speaking, facial droop, sore throat, chest pain, shortness of breath, abdominal pain, nausea/vomiting/diarrhea, dysuria, or weakness/numbness/paresthesias in any extremity   Physical Exam  Triage Vital Signs: ED Triage Vitals  Enc Vitals Group     BP 11/15/21 1124 (!) 146/59     Pulse Rate 11/15/21 1124 64     Resp 11/15/21 1124 18     Temp 11/15/21 1124 98 F (36.7 C)     Temp Source 11/15/21 1124 Oral     SpO2 11/15/21 1124 96 %     Weight 11/15/21 1127 145 lb (65.8 kg)     Height 11/15/21 1127 '5\' 2"'$  (1.575 m)     Head Circumference --      Peak Flow --      Pain Score 11/15/21 1127 0     Pain Loc --      Pain Edu? --      Excl. in [place]? --    Most recent vital signs: Vitals:   11/15/21 1124 11/15/21 1403  BP: (!) 146/59 (!) 155/64  Pulse: 64 63  Resp: 18 (!) 26  Temp: 98 F (36.7 C)   SpO2: 96% 95%   General: Awake, oriented x4. CV:  Good peripheral perfusion.  Resp:  Normal effort.  Abd:  No distention.  Other:  Elderly overweight Caucasian female laying in bed in no acute distress.  NIHSS 0 ED Results / Procedures / Treatments   Labs (all labs ordered are listed, but only abnormal results are displayed) Labs Reviewed  CBC - Abnormal; Notable for the following components:      Result Value   Hemoglobin 16.0 (*)    HCT 47.5 (*)    All other components within normal limits  COMPREHENSIVE METABOLIC PANEL - Abnormal; Notable for the following components:   Glucose, Bld 111 (*)    All other components within normal limits  PROTIME-INR  APTT  DIFFERENTIAL  ETHANOL  I-STAT CREATININE, ED  CBG MONITORING, ED   EKG ED ECG REPORT I, Naaman Plummer, the attending physician, personally viewed and interpreted this ECG. Date: 11/15/2021 EKG Time: 1133 Rate: 62 Rhythm: normal sinus rhythm QRS Axis: normal Intervals: normal ST/T Wave abnormalities: normal Narrative Interpretation: no evidence of acute ischemia RADIOLOGY ED MD interpretation: CT of the head without contrast interpreted by me shows no evidence of acute abnormalities including no intracerebral hemorrhage, obvious masses, or significant edema -Agree with radiology assessment Official radiology report(s): CT HEAD WO CONTRAST  Result Date: 11/15/2021 CLINICAL DATA:  Slurred speech, dizziness, headache EXAM: CT HEAD WITHOUT CONTRAST TECHNIQUE: Contiguous axial images were obtained from the base of the skull through the vertex without  intravenous contrast. RADIATION DOSE REDUCTION: This exam was performed according to the departmental dose-optimization program which includes automated exposure control, adjustment of the mA and/or kV according to patient size and/or use of iterative reconstruction technique. COMPARISON:  12/31/2020 FINDINGS: Brain: No evidence of acute infarction, hemorrhage, cerebral edema, mass, mass effect, or midline shift. No hydrocephalus or extra-axial fluid collection. 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 Periventricular  white matter changes, likely the sequela of chronic small vessel ischemic disease. Vascular: No hyperdense vessel. Atherosclerotic calcifications in the intracranial carotid and vertebral arteries. Skull: Negative for fracture or focal lesion. Sinuses/Orbits: Mucosal thickening in the ethmoid air cells. No acute finding in the orbits. Other: The mastoid air cells are well aerated. IMPRESSION: No acute intracranial process. Electronically Signed   By: Merilyn Baba M.D.   On: 11/15/2021 11:56   PROCEDURES: Critical Care performed: Yes, see critical care procedure note(s) .1-3 Lead EKG Interpretation  Performed by: Naaman Plummer, MD Authorized by: Naaman Plummer, MD     Interpretation: normal     ECG rate:  77   ECG rate assessment: normal     Rhythm: sinus rhythm     Ectopy: none     Conduction: normal   CRITICAL CARE Performed by: Naaman Plummer  Total critical care time: 41 minutes  Critical care time was exclusive of separately billable procedures and treating other patients.  Critical care was necessary to treat or prevent imminent or life-threatening deterioration.  Critical care was time spent personally by me on the following activities: development of treatment plan with patient and/or surrogate as well as nursing, discussions with consultants, evaluation of patient's response to treatment, examination of patient, obtaining history from patient or surrogate, ordering and performing treatments and interventions, ordering and review of laboratory studies, ordering and review of radiographic studies, pulse oximetry and re-evaluation of patient's condition.  MEDICATIONS ORDERED IN ED: Medications  sodium chloride flush (NS) 0.9 % injection 3 mL (has no administration in time range)   IMPRESSION / MDM / ASSESSMENT AND PLAN / ED COURSE  I reviewed the triage vital signs and the nursing notes.                             The patient is on the cardiac monitor to evaluate for  evidence of arrhythmia and/or significant heart rate changes. Patient's presentation is most consistent with acute presentation with potential threat to life or bodily function. Patient is a 71 year old female who presents with symptoms concerning for TIA/CA PMH risk factors: Tobacco abuse, carotid stenosis, history of CVA Neurologic Deficits: Lightheadedness, slurred speech Last known Well Time: Days prior to arrival NIH Stroke Score: 0 Given History and Exam I have lower suspicion for infectious etiology, neurologic changes secondary to toxicologic ingestion, seizure, complex migraine. Presentation concerning for possible stroke requiring workup.  Workup: Labs: POC glucose, CBC, BMP, LFTs, Troponin, PT/INR, PTT, Type and Screen Other Diagnostics: ECG, CXR, non-contrast head CT followed by CTA brain and neck (to r/o large vessel occlusion amenable to thrombectomy) Interventions: Patient's not eligible for TPA due to resolution of symptoms prior to evaluation  Consult: hospitalist Disposition: Admit   FINAL CLINICAL IMPRESSION(S) / ED DIAGNOSES   Final diagnoses:  None   Rx / DC Orders   ED Discharge Orders     None      Note:  This document was prepared using Dragon voice recognition software and may include unintentional dictation errors.   Naaman Plummer, MD 11/15/21 [phone]

## 2021-11-15 NOTE — H&P (Signed)
History and Physical    DISHA COTTAM OYD:[id number] DOB: October 08, 1950 DOA: 11/15/2021  Referring MD/NP/PA:   PCP: Sherre Scarlet, PA-C   Patient coming from:  The patient is coming from home.  At baseline, pt is independent for most of ADL.        Chief Complaint: Slurred speech, dizziness, facial droop, headache, dizziness  HPI: Molly Matthews is a 71 y.o. female with medical history significant of stroke, hypertension, hyperlipidemia, COPD, GERD, depression with anxiety, tobacco abuse, PAD you, ABD, gastritis, carotid artery stenosis (s/p of her left endarterectomy), CAD, fatty liver, who presents with slurred speech, dizziness, facial droop, headache and dizziness.  Per her husband, patient was noted to have slurred speech 3 days ago, which has been persistent.  Patient also has dizziness, frontal headache intermittently.  Patient does not have unilateral numbness or tinglings in extremities, but has weakness in bilateral hips.  Yesterday patient had 1 episode of facial droop which has resolved spontaneously.  Patient had difficulty swallowing yesterday, which seem to have resolved.  Denies chest pain, shortness breath.  Patient has mild dry cough due to COPD and smoking.  Does not have nausea, vomiting, diarrhea or abdominal pain.  No symptoms of UTI.  Patient continues to smoke.  Data reviewed independently and ED Course: pt was found to have WBC 9.1, INR 1.0, PTT 29, alcohol level less than 10, GFR> 60, blood pressure 135/88, 221/68, heart rate 76, RR 26, oxygen saturation 96% on room air.  CT of head is negative for acute intracranial abnormalities.  Patient is admitted to telemetry bed as inpatient.  Dr. Quinn Axe of neuro is consulted.  MRI of brain and MRA-head and neck Acute infarction of the right anterior para median pons. No swelling or hemorrhage. Extensive chronic small-vessel ischemic changes elsewhere throughout the brain. Old cortical and subcortical infarction in the left  frontal operculum.   Severely disease left common carotid artery. Left internal carotid artery is occluded at its origin. Right common carotid artery and internal carotid artery are widely patent to the skull base. Moderate stenosis of the right ICA in the siphon. Flow to the left intracranial anterior circulation occurs via patent anterior and posterior communicating arteries.   Distal vessel intracranial atherosclerotic irregularity diffusely.   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 Dominant right vertebral artery supplies the basilar.  EKG: I have personally reviewed.  Sinus rhythm, QTc 440, T wave inversion in lateral leads and V4-V6.  Mild ST elevation in V1-V2 which is similar to previous EKG on 04/25/2018 (patient does not have chest pain).   Review of Systems:   General: no fevers, chills, no body weight gain, has fatigue HEENT: no blurry vision, hearing changes or sore throat.  Has headache Respiratory: no dyspnea, coughing, wheezing CV: no chest pain, no palpitations GI: no nausea, vomiting, abdominal pain, diarrhea, constipation GU: no dysuria, burning on urination, increased urinary frequency, hematuria  Ext: no leg edema Neuro: no unilateral weakness, numbness, or tingling, no vision change or hearing loss.  Has slurred speech, facial droop, dizziness Skin: no rash, no skin tear. MSK: No muscle spasm, no deformity, no limitation of range of movement in spin Heme: No easy bruising.  Travel history: No recent long distant travel.   Allergy:  Allergies  Allergen Reactions   Buspirone     Other reaction(s): Headache   Chantix [Varenicline Tartrate] Other (See Comments)    Jittery muscle movements   Statins Other (See Comments)    Muscle  and joint aches - can take in small doses   Sulfa Antibiotics     Other reaction(s): Unknown   Zithromax [Azithromycin] Other (See Comments)    Back Pain   Elemental Sulfur Rash    Past Medical History:  Diagnosis  Date   Anginal pain ([place])    Anxiety    Aortic atherosclerosis ([place])    Arthritis    Cancer ([place]) 01/2017   Skin cancer on right leg   Carotid artery stenosis    Carotid stenosis, left    100 % blockage   Cholelithiasis    Chronic enteritis    Chronic venous insufficiency    Colon polyps    COPD (chronic obstructive pulmonary disease) ([facility])    mild   Coronary artery disease    Depression    Diastasis recti    Diverticulosis    Diverticulosis    Dyspnea    Erosive esophagitis    Esophagitis    Fatty liver    Fatty liver disease, nonalcoholic    Gastritis    GERD (gastroesophageal reflux disease)    Headache    Herniated lumbar intervertebral disc    Herniated lumbar intervertebral disc    Hyperlipemia    Hyperlipidemia    Hypertension    IBS (irritable bowel syndrome)    Left carotid artery stenosis    Osteopenia    Osteopenia    Peptic ulcer disease    Personal history of tobacco use, presenting hazards to health 03/14/2016   SCC (squamous cell carcinoma)    Stroke ([place])    TIA's X 2   Tobacco dependence    Vitamin D deficiency     Past Surgical History:  Procedure Laterality Date   ABDOMINAL HYSTERECTOMY     BREAST BIOPSY Left    negative 2008   CAROTID ENDARTERECTOMY     CHOLECYSTECTOMY     COLONOSCOPY W/ POLYPECTOMY     COLONOSCOPY WITH PROPOFOL N/A 07/26/2020   Procedure: COLONOSCOPY WITH PROPOFOL;  Surgeon: Toledo, Benay Pike, MD;  Location: [facility];  Service: Gastroenterology;  Laterality: N/A;   DILATION AND CURETTAGE OF UTERUS     ESOPHAGOGASTRODUODENOSCOPY (EGD) WITH PROPOFOL N/A 01/26/2017   Procedure: ESOPHAGOGASTRODUODENOSCOPY (EGD) WITH PROPOFOL;  Surgeon: Toledo, Benay Pike, MD;  Location: [facility];  Service: Gastroenterology;  Laterality: N/A;   ESOPHAGOGASTRODUODENOSCOPY (EGD) WITH PROPOFOL N/A 07/26/2020   Procedure: ESOPHAGOGASTRODUODENOSCOPY (EGD) WITH PROPOFOL;  Surgeon: Toledo, Benay Pike, MD;  Location: [facility] END[facility];   Service: Gastroenterology;  Laterality: N/A;   HEMORRHOID SURGERY N/A 07/25/2016   Procedure: HEMORRHOIDECTOMY INTERNAL AND EXTERNAL;  Surgeon: Leonie Green, MD;  Location: [facility] ORS;  Service: General;  Laterality: N/A;   MICROLARYNGOSCOPY N/A 01/29/2019   Procedure: MICROLARYNGOSCOPY with Vocal Cord polyp removal.   No laser is needed.;  Surgeon: Clyde Canterbury, MD;  Location: [facility] ORS;  Service: ENT;  Laterality: N/A;   VASCULAR SURGERY Right    Carotid Endarterectomy     Social History:  reports that she has been smoking cigarettes. She has a 45.00 pack-year smoking history. She has never used smokeless tobacco. She reports that she does not drink alcohol and does not use drugs.  Family History:  Family History  Problem Relation Age of Onset   Breast cancer Mother 10   Atrial fibrillation Mother    Breast cancer Sister 48   Heart attack Father    Colon cancer Brother    Lung cancer Brother      Prior to Admission medications  Medication Sig Start Date End Date Taking? Authorizing Provider  acetaminophen (TYLENOL) 500 MG tablet Take 1,000 mg by mouth every 6 (six) hours as needed.    [provider]  albuterol (PROVENTIL HFA;VENTOLIN HFA) 108 (90 Base) MCG/ACT inhaler Inhale 2 puffs into the lungs every 6 (six) hours as needed for wheezing or shortness of breath.    [provider]  citalopram (CELEXA) 40 MG tablet Take 40 mg by mouth at bedtime.  Patient not taking: Reported on 10/13/2021    [provider]  clopidogrel (PLAVIX) 75 MG tablet Take by mouth. 03/24/19   [provider]  ergocalciferol (VITAMIN D2) 1.25 MG (50000 UT) capsule Take 50,000 Units by mouth once a week. Patient not taking: Reported on 10/13/2021    [provider]  escitalopram (LEXAPRO) 20 MG tablet Take 20 mg by mouth daily. 09/28/21   [provider]  levETIRAcetam (KEPPRA) 500 MG tablet Take 1 tablet (500 mg total) by mouth 2 (two) times daily.  12/31/20   Duffy Bruce, MD  pantoprazole (PROTONIX) 40 MG tablet Take 40 mg by mouth daily.    [provider]  rosuvastatin (CRESTOR) 5 MG tablet Take 1/2  tab by mouth every other day M, W, F Patient not taking: Reported on 10/13/2021 04/19/18   [provider]  TRELEGY ELLIPTA 100-62.5-25 MCG/ACT AEPB Inhale 1 puff into the lungs daily. 09/19/21   [provider]  umeclidinium-vilanterol (ANORO ELLIPTA) 62.5-25 MCG/INH AEPB Inhale 1 puff into the lungs daily.    [provider]    Physical Exam: Vitals:   11/15/21 1127 11/15/21 1403 11/15/21 1530 11/15/21 1657  BP:  (!) 155/64 135/88   Pulse:  63 66 76  Resp:  (!) 26 17 (!) 21  Temp:      TempSrc:      SpO2:  95% 91% 94%  Weight: 65.8 kg     Height: '5\' 2"'$  (1.575 m)      General: Not in acute distress HEENT:       Eyes: PERRL, EOMI, no scleral icterus.       ENT: No discharge from the ears and nose, no pharynx injection, no tonsillar enlargement.        Neck: No JVD, no bruit, no mass felt. Heme: No neck lymph node enlargement. Cardiac: S1/S2, RRR, No murmurs, No gallops or rubs. Respiratory: No rales, wheezing, rhonchi or rubs. GI: Soft, nondistended, nontender, no rebound pain, no organomegaly, BS present. GU: No hematuria Ext: No pitting leg edema bilaterally. 1+DP/PT pulse bilaterally. Musculoskeletal: No joint deformities, No joint redness or warmth, no limitation of ROM in spin. Skin: No rashes.  Neuro: Alert, oriented X3, cranial nerves II-XII grossly intact, moves all extremities normally. Muscle strength 5/5 in all extremities, sensation to light touch intact. Brachial reflex 2+ bilaterally.  Patient has mild slurred speech Psych: Patient is not psychotic, no suicidal or hemocidal ideation.  Labs on Admission: I have personally reviewed following labs and imaging studies  CBC: Recent Labs  Lab 11/15/21 1131  WBC 9.1  NEUTROABS 5.7  HGB 16.0*  HCT 47.5*  MCV 94.1  PLT 867    Basic Metabolic Panel: Recent Labs  Lab 11/15/21 1131  NA 141  K 4.1  CL 108  CO2 28  GLUCOSE 111*  BUN 13  CREATININE 0.68  CALCIUM 9.6   GFR: Estimated Creatinine Clearance: 55.8 mL/min (by C-G formula based on SCr of 0.68 mg/dL). Liver Function Tests: Recent Labs  Lab 11/15/21  1131  AST 19  ALT 18  ALKPHOS 60  BILITOT 1.0  PROT 7.5  ALBUMIN 4.2   No results for input(s): "LIPASE", "AMYLASE" in the last 168 hours. No results for input(s): "AMMONIA" in the last 168 hours. Coagulation Profile: Recent Labs  Lab 11/15/21 1131  INR 1.0   Cardiac Enzymes: No results for input(s): "CKTOTAL", "CKMB", "CKMBINDEX", "TROPONINI" in the last 168 hours. BNP (last 3 results) No results for input(s): "PROBNP" in the last 8760 hours. HbA1C: No results for input(s): "HGBA1C" in the last 72 hours. CBG: No results for input(s): "GLUCAP" in the last 168 hours. Lipid Profile: No results for input(s): "CHOL", "HDL", "LDLCALC", "TRIG", "CHOLHDL", "LDLDIRECT" in the last 72 hours. Thyroid Function Tests: No results for input(s): "TSH", "T4TOTAL", "FREET4", "T3FREE", "THYROIDAB" in the last 72 hours. Anemia Panel: No results for input(s): "VITAMINB12", "FOLATE", "FERRITIN", "TIBC", "IRON", "RETICCTPCT" in the last 72 hours. Urine analysis:    Component Value Date/Time   COLORURINE YELLOW (A) 04/25/2018 1713   APPEARANCEUR CLEAR (A) 04/25/2018 1713   APPEARANCEUR Cloudy 01/29/2014 2124   LABSPEC 1.004 (L) 04/25/2018 1713   LABSPEC 1.024 01/29/2014 2124   PHURINE 6.0 04/25/2018 1713   GLUCOSEU NEGATIVE 04/25/2018 1713   GLUCOSEU Negative 01/29/2014 2124   HGBUR NEGATIVE 04/25/2018 1713   BILIRUBINUR NEGATIVE 04/25/2018 1713   BILIRUBINUR Negative 01/29/2014 2124   KETONESUR NEGATIVE 04/25/2018 1713   PROTEINUR NEGATIVE 04/25/2018 1713   NITRITE NEGATIVE 04/25/2018 1713   LEUKOCYTESUR NEGATIVE 04/25/2018 1713   LEUKOCYTESUR 2+ 01/29/2014 2124   Sepsis  Labs: '@LABRCNTIP'$ (procalcitonin:4,lacticidven:4) )No results found for this or any previous visit (from the past 240 hour(s)).   Radiological Exams on Admission: MR ANGIO NECK WO CONTRAST  Result Date: 11/15/2021 CLINICAL DATA:  Neuro deficit, acute, stroke suspected. Slurred speech, dizziness and headache. EXAM: MRI HEAD WITHOUT CONTRAST MRA HEAD WITHOUT CONTRAST MRA NECK WITHOUT CONTRAST TECHNIQUE: Multiplanar, multiecho pulse sequences of the brain and surrounding structures were obtained without intravenous contrast. Angiographic images of the Circle of Willis were obtained using MRA technique without intravenous contrast. Angiographic images of the neck were obtained using MRA technique without intravenous contrast. Carotid stenosis measurements (when applicable) are obtained utilizing NASCET criteria, using the distal internal carotid diameter as the denominator. COMPARISON:  Head CT earlier same day FINDINGS: MRI HEAD FINDINGS Brain: Diffusion imaging shows acute stroke in the anterior right para median pons. No evidence of hemorrhage or swelling. No other acute finding. Chronic small-vessel ischemic changes seen elsewhere within the pons. Old small vessel infarction of the left cerebellum. Chronic small-vessel ischemic changes of the thalami, basal ganglia and hemispheric white matter. Old cortical and subcortical infarction of the left frontal operculum with encephalomalacia. No evidence of mass, obstructive hydrocephalus or extra-axial collection. Vascular: Abnormal flow in the left internal carotid artery. Skull and upper cervical spine: Negative Sinuses/Orbits: Clear/normal Other: None MRA HEAD FINDINGS Right internal carotid artery is widely patent through the skull base. Moderate siphon atherosclerotic irregularity and stenosis. The right middle cerebral artery is patent. More distal MCA branches do show some atherosclerotic irregularity. Focal stenosis at the origin of the right anterior  cerebral artery. No antegrade flow in the left internal carotid artery. Flow to the left anterior and middle cerebral vessels occurs by patent anterior and posterior communicating arteries. Dominant right vertebral artery is widely patent to the basilar. Left vertebral artery supplies PICA and is then occluded. No basilar stenosis. Right superior cerebellar artery appears normal. Left superior cerebellar artery appears narrow  and irregular. Flow is present in both posterior cerebral arteries. There is distal vessel atherosclerotic irregularity, right worse than left. MRA NECK FINDINGS Right common carotid artery widely patent to the bifurcation. Mild atherosclerotic irregularity of the carotid bifurcation and ICA bulb but no stenosis. Left common carotid artery is a severely disease and irregular vessel. There is some flow to the bifurcation, but the ICA is occluded at the bifurcation. Vertebral artery origins are not visible due to technical factors. There is antegrade flow in both vertebral arteries with the right being dominant. IMPRESSION: Acute infarction of the right anterior para median pons. No swelling or hemorrhage. Extensive chronic small-vessel ischemic changes elsewhere throughout the brain. Old cortical and subcortical infarction in the left frontal operculum. Severely disease left common carotid artery. Left internal carotid artery is occluded at its origin. Right common carotid artery and internal carotid artery are widely patent to the skull base. Moderate stenosis of the right ICA in the siphon. Flow to the left intracranial anterior circulation occurs via patent anterior and posterior communicating arteries. Distal vessel intracranial atherosclerotic irregularity diffusely. 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 Dominant right vertebral artery supplies the basilar. Electronically Signed   By: Nelson Chimes M.D.   On: 11/15/2021 16:54   MR ANGIO HEAD WO CONTRAST  Result Date:  11/15/2021 CLINICAL DATA:  Neuro deficit, acute, stroke suspected. Slurred speech, dizziness and headache. EXAM: MRI HEAD WITHOUT CONTRAST MRA HEAD WITHOUT CONTRAST MRA NECK WITHOUT CONTRAST TECHNIQUE: Multiplanar, multiecho pulse sequences of the brain and surrounding structures were obtained without intravenous contrast. Angiographic images of the Circle of Willis were obtained using MRA technique without intravenous contrast. Angiographic images of the neck were obtained using MRA technique without intravenous contrast. Carotid stenosis measurements (when applicable) are obtained utilizing NASCET criteria, using the distal internal carotid diameter as the denominator. COMPARISON:  Head CT earlier same day FINDINGS: MRI HEAD FINDINGS Brain: Diffusion imaging shows acute stroke in the anterior right para median pons. No evidence of hemorrhage or swelling. No other acute finding. Chronic small-vessel ischemic changes seen elsewhere within the pons. Old small vessel infarction of the left cerebellum. Chronic small-vessel ischemic changes of the thalami, basal ganglia and hemispheric white matter. Old cortical and subcortical infarction of the left frontal operculum with encephalomalacia. No evidence of mass, obstructive hydrocephalus or extra-axial collection. Vascular: Abnormal flow in the left internal carotid artery. Skull and upper cervical spine: Negative Sinuses/Orbits: Clear/normal Other: None MRA HEAD FINDINGS Right internal carotid artery is widely patent through the skull base. Moderate siphon atherosclerotic irregularity and stenosis. The right middle cerebral artery is patent. More distal MCA branches do show some atherosclerotic irregularity. Focal stenosis at the origin of the right anterior cerebral artery. No antegrade flow in the left internal carotid artery. Flow to the left anterior and middle cerebral vessels occurs by patent anterior and posterior communicating arteries. Dominant right vertebral  artery is widely patent to the basilar. Left vertebral artery supplies PICA and is then occluded. No basilar stenosis. Right superior cerebellar artery appears normal. Left superior cerebellar artery appears narrow and irregular. Flow is present in both posterior cerebral arteries. There is distal vessel atherosclerotic irregularity, right worse than left. MRA NECK FINDINGS Right common carotid artery widely patent to the bifurcation. Mild atherosclerotic irregularity of the carotid bifurcation and ICA bulb but no stenosis. Left common carotid artery is a severely disease and irregular vessel. There is some flow to the bifurcation, but the ICA is occluded at the bifurcation. Vertebral  artery origins are not visible due to technical factors. There is antegrade flow in both vertebral arteries with the right being dominant. IMPRESSION: Acute infarction of the right anterior para median pons. No swelling or hemorrhage. Extensive chronic small-vessel ischemic changes elsewhere throughout the brain. Old cortical and subcortical infarction in the left frontal operculum. Severely disease left common carotid artery. Left internal carotid artery is occluded at its origin. Right common carotid artery and internal carotid artery are widely patent to the skull base. Moderate stenosis of the right ICA in the siphon. Flow to the left intracranial anterior circulation occurs via patent anterior and posterior communicating arteries. Distal vessel intracranial atherosclerotic irregularity diffusely. 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 Dominant right vertebral artery supplies the basilar. Electronically Signed   By: Nelson Chimes M.D.   On: 11/15/2021 16:54   MR BRAIN WO CONTRAST  Result Date: 11/15/2021 CLINICAL DATA:  Neuro deficit, acute, stroke suspected. Slurred speech, dizziness and headache. EXAM: MRI HEAD WITHOUT CONTRAST MRA HEAD WITHOUT CONTRAST MRA NECK WITHOUT CONTRAST TECHNIQUE: Multiplanar, multiecho pulse  sequences of the brain and surrounding structures were obtained without intravenous contrast. Angiographic images of the Circle of Willis were obtained using MRA technique without intravenous contrast. Angiographic images of the neck were obtained using MRA technique without intravenous contrast. Carotid stenosis measurements (when applicable) are obtained utilizing NASCET criteria, using the distal internal carotid diameter as the denominator. COMPARISON:  Head CT earlier same day FINDINGS: MRI HEAD FINDINGS Brain: Diffusion imaging shows acute stroke in the anterior right para median pons. No evidence of hemorrhage or swelling. No other acute finding. Chronic small-vessel ischemic changes seen elsewhere within the pons. Old small vessel infarction of the left cerebellum. Chronic small-vessel ischemic changes of the thalami, basal ganglia and hemispheric white matter. Old cortical and subcortical infarction of the left frontal operculum with encephalomalacia. No evidence of mass, obstructive hydrocephalus or extra-axial collection. Vascular: Abnormal flow in the left internal carotid artery. Skull and upper cervical spine: Negative Sinuses/Orbits: Clear/normal Other: None MRA HEAD FINDINGS Right internal carotid artery is widely patent through the skull base. Moderate siphon atherosclerotic irregularity and stenosis. The right middle cerebral artery is patent. More distal MCA branches do show some atherosclerotic irregularity. Focal stenosis at the origin of the right anterior cerebral artery. No antegrade flow in the left internal carotid artery. Flow to the left anterior and middle cerebral vessels occurs by patent anterior and posterior communicating arteries. Dominant right vertebral artery is widely patent to the basilar. Left vertebral artery supplies PICA and is then occluded. No basilar stenosis. Right superior cerebellar artery appears normal. Left superior cerebellar artery appears narrow and irregular.  Flow is present in both posterior cerebral arteries. There is distal vessel atherosclerotic irregularity, right worse than left. MRA NECK FINDINGS Right common carotid artery widely patent to the bifurcation. Mild atherosclerotic irregularity of the carotid bifurcation and ICA bulb but no stenosis. Left common carotid artery is a severely disease and irregular vessel. There is some flow to the bifurcation, but the ICA is occluded at the bifurcation. Vertebral artery origins are not visible due to technical factors. There is antegrade flow in both vertebral arteries with the right being dominant. IMPRESSION: Acute infarction of the right anterior para median pons. No swelling or hemorrhage. Extensive chronic small-vessel ischemic changes elsewhere throughout the brain. Old cortical and subcortical infarction in the left frontal operculum. Severely disease left common carotid artery. Left internal carotid artery is occluded at its origin. Right common carotid  artery and internal carotid artery are widely patent to the skull base. Moderate stenosis of the right ICA in the siphon. Flow to the left intracranial anterior circulation occurs via patent anterior and posterior communicating arteries. Distal vessel intracranial atherosclerotic irregularity diffusely. 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 Dominant right vertebral artery supplies the basilar. Electronically Signed   By: Nelson Chimes M.D.   On: 11/15/2021 16:54   CT HEAD WO CONTRAST  Result Date: 11/15/2021 CLINICAL DATA:  Slurred speech, dizziness, headache EXAM: CT HEAD WITHOUT CONTRAST TECHNIQUE: Contiguous axial images were obtained from the base of the skull through the vertex without intravenous contrast. RADIATION DOSE REDUCTION: This exam was performed according to the departmental dose-optimization program which includes automated exposure control, adjustment of the mA and/or kV according to patient size and/or use of iterative reconstruction  technique. COMPARISON:  12/31/2020 FINDINGS: Brain: No evidence of acute infarction, hemorrhage, cerebral edema, mass, mass effect, or midline shift. No hydrocephalus or extra-axial fluid collection. 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 Periventricular white matter changes, likely the sequela of chronic small vessel ischemic disease. Vascular: No hyperdense vessel. Atherosclerotic calcifications in the intracranial carotid and vertebral arteries. Skull: Negative for fracture or focal lesion. Sinuses/Orbits: Mucosal thickening in the ethmoid air cells. No acute finding in the orbits. Other: The mastoid air cells are well aerated. IMPRESSION: No acute intracranial process. Electronically Signed   By: Merilyn Baba M.D.   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 Severely disease left common carotid artery. Left internal carotid artery is occluded at its origin. Moderate stenosis of the right ICA in the siphon. Distal vessel intracranial atherosclerotic irregularity diffusely. 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 Dominant right vertebral artery supplies the basilar.  - Admit to tele med bed as inpatient - Patient does not need permissive hypertension since her symptoms have been going on for 3 days - Continue Plavix - Started aspirin 325 mg daily - started Zetia (patient stopped taking Crestor due to muscle pain) - fasting lipid panel and HbA1c  - 2D transthoracic echocardiography  -  swallowing screen. If fails, will get SLP - Check UDS  - PT/OT consult  Carotid stenosis -on ASA and plavix - started Zetia  Hypertension Blood pressure 135/88, 221/68.  Patient's not taking blood pressure medications currently -Start amlodipine 10 mg daily -IV hydralazine as needed  Hyperlipidemia - Started zetia -f/u FLP  Depression Continue home medications  COPD (chronic obstructive pulmonary disease) (HCC) Stable -As needed albuterol  Coronary artery disease No chest pain -Aspirin, Plavix, Zetia  Tobacco dependence - DId counseling about importance of quitting smoking -Patient promised to quit smoking -Nicotine patch  PUD (peptic ulcer disease) -Protonix          DVT ppx:  SQ Lovenox  Code Status: Full code  Family Communication:   Yes, patient's  husband  at bed side.       Disposition Plan:  Anticipate discharge back to previous environment  Consults called:  Dr. Quinn Axe  of neuro  Admission status and Level of care: Telemetry Medical:    as inpt       Severity of Illness:  The appropriate patient status for this patient is INPATIENT. Inpatient status is judged to be reasonable and necessary in order to provide the required intensity of service to ensure the patient's safety. The patient's presenting symptoms, physical exam findings, and initial radiographic and laboratory data in the context of their chronic comorbidities is felt to place them at high risk for further clinical deterioration. Furthermore, it is not anticipated that the patient will be medically stable for discharge from the hospital within 2 midnights of admission.   * I certify that at the point of admission it is my clinical judgment that the patient will require inpatient hospital care spanning beyond 2 midnights from the point of admission due to high intensity of service, high risk for further deterioration and high frequency of surveillance required.*       Date of  Service 11/15/2021    Ivor [facility]   If 7PM-7AM, please contact night-coverage [email or web address] 11/15/2021, 6:51 PM

## 2021-11-15 NOTE — Progress Notes (Signed)
   11/15/21 2100  Mobility  HOB Elevated/Bed Position Self regulated  Activity Ambulated independently to bathroom  Range of Motion/Exercises Active;All extremities  Level of Assistance Standby assist, set-up cues, supervision of patient - no hands on  Assistive Device None  Distance Ambulated (ft) 10 ft  Activity Response Tolerated well

## 2021-11-15 NOTE — Assessment & Plan Note (Signed)
-   Stable -As needed albuterol 

## 2021-11-15 NOTE — Assessment & Plan Note (Signed)
No chest pain -Aspirin, Plavix, Zetia

## 2021-11-15 NOTE — Assessment & Plan Note (Signed)
-  on ASA and plavix - started Zetia

## 2021-11-16 ENCOUNTER — Inpatient Hospital Stay
Admit: 2021-11-16 | Discharge: 2021-11-16 | Disposition: A | Discharge status: Home / Self Care | Payer: Medicare Other | Attending: Internal Medicine | Admitting: Internal Medicine

## 2021-11-16 DIAGNOSIS — I639 Cerebral infarction, unspecified: Secondary | ICD-10-CM | POA: Diagnosis not present

## 2021-11-16 LAB — ECHOCARDIOGRAM COMPLETE
AR max vel: 2.59 cm2
AV Area VTI: 3.1 cm2
AV Area mean vel: 2.6 cm2
AV Mean grad: 4.5 mmHg
AV Peak grad: 9.1 mmHg
Ao pk vel: 1.51 m/s
Area-P 1/2: 2.83 cm2
Height: 62 in
S' Lateral: 2.7 cm
Weight: 2320 oz

## 2021-11-16 LAB — LIPID PANEL
Cholesterol: 169 mg/dL (ref 0–200)
HDL: 28 mg/dL — ABNORMAL LOW (ref >40)
LDL Cholesterol: 110 mg/dL — ABNORMAL HIGH (ref 0–99)
Total CHOL/HDL Ratio: 6 RATIO
Triglycerides: 154 mg/dL — ABNORMAL HIGH (ref <150)
VLDL: 31 mg/dL (ref 0–40)

## 2021-11-16 MED ORDER — EZETIMIBE 10 MG PO TABS: 10.0000 mg | ORAL_TABLET | Freq: Every day | ORAL | 0 refills | Status: DC

## 2021-11-16 MED ORDER — AMLODIPINE BESYLATE 5 MG PO TABS: 5.0000 mg | ORAL_TABLET | Freq: Every day | ORAL | 0 refills | Status: DC

## 2021-11-16 MED ORDER — ROSUVASTATIN CALCIUM 10 MG PO TABS: 5.0000 mg | ORAL_TABLET | Freq: Every evening | ORAL | Status: DC

## 2021-11-16 MED ORDER — ASPIRIN 81 MG PO TBEC: 81.0000 mg | DELAYED_RELEASE_TABLET | Freq: Every day | ORAL | 0 refills | Status: AC

## 2021-11-16 NOTE — Progress Notes (Signed)
Molly Matthews to be D/C'd  per MD order.  Discussed with the patient and all questions fully answered.  VSS, Skin clean, dry and intact without evidence of skin break down, no evidence of skin tears noted.  IV catheter discontinued intact. Site without signs and symptoms of complications. Dressing and pressure applied.  An After Visit Summary was printed and given to the patient. Patient received prescription.  D/c education completed with patient/family including follow up instructions, medication list, d/c activities limitations if indicated, with other d/c instructions as indicated by MD - patient able to verbalize understanding, all questions fully answered.   Patient instructed to return to ED, call 911, or call MD for any changes in condition.   Patient to be escorted via [place], and D/C home via private auto.

## 2021-11-16 NOTE — Evaluation (Addendum)
Physical Therapy Evaluation Patient Details Name: Molly Matthews MRN: [id number] DOB: [birth year] Today's Date: 11/16/2021  History of Present Illness  71 y.o. female with medical history significant of stroke, hypertension, hyperlipidemia, COPD, GERD, depression with anxiety, tobacco abuse, PAD you, ABD, gastritis, carotid artery stenosis (s/p of her left endarterectomy), CAD, fatty liver, who presents with slurred speech, dizziness, facial droop, headache and dizziness. MRI showed acute infarct R pons.   Clinical Impression  Pt upright ambulating with NT from bathroom to the bed.  Pt given medicine by nursing prior to ambulating with therapist.  Pt has 5 steps to get into the house at the front and has rails on both sides, but unable to reach both at the same time.  Pt notes she keeps having strokes and is unsure as to why.  Pt able to perform transfers without any assistance.  Pt does have some slight unsteadiness during gait, but notes that is normal for her and husband agrees.  Pt demonstrates slight reduction in strength of the L LE, but more noticeable in the L UE.  Pt able to manage community distance with stair training as well prior to coming back to the room.  Pt will continue therapy in the hospital in order to improve L LE strength training to be more at baseline levels, but anticipate pt not needing PT services upon discharge.  Current discharge plans to home without PT is most appropriate at this time.  Pt will continue to benefit from skilled therapy in order to address deficits listed below.        Recommendations for follow up therapy are one component of a multi-disciplinary discharge planning process, led by the attending physician.  Recommendations may be updated based on patient status, additional functional criteria and insurance authorization.  Follow Up Recommendations No PT follow up      Assistance Recommended at Discharge None  Patient can return home with the  following       Equipment Recommendations None recommended by PT  Recommendations for Other Services       Functional Status Assessment Patient has had a recent decline in their functional status and demonstrates the ability to make significant improvements in function in a reasonable and predictable amount of time.     Precautions / Restrictions Precautions Precautions: Fall      Mobility  Bed Mobility Overal bed mobility: Modified Independent, Independent                  Transfers Overall transfer level: Modified independent Equipment used: None Transfers: Sit to/from Stand Sit to Stand: Modified independent (Device/Increase time)                Ambulation/Gait Ambulation/Gait assistance: Min guard, Supervision Gait Distance (Feet): 200 Feet Assistive device: None Gait Pattern/deviations: Step-through pattern, Decreased stance time - left Gait velocity: decreased     General Gait Details: minimal reduction in stance time on the L LE.  Stairs Stairs: Yes Stairs assistance: Min guard Stair Management: One rail Right, One rail Left, Alternating pattern, Step to pattern, Forwards Number of Stairs: 6 General stair comments: Pt performs well with step-to pattern descending and alternating pattern ascending.  Wheelchair Mobility    Modified Rankin (Stroke Patients Only)       Balance Overall balance assessment: Modified Independent  Pertinent Vitals/Pain Pain Assessment Pain Assessment: No/denies pain    Home Living Family/patient expects to be discharged to:: Private residence Living Arrangements: Spouse/significant other Available Help at Discharge: Family Type of Home: House Home Access: Stairs to enter Entrance Stairs-Rails: Psychiatric nurse of Steps: 4   Home Layout: One level Home Equipment: None      Prior Function Prior Level of Function :  Independent/Modified Independent;Driving               ADLs Comments: Pt is ind in self care and IADL tasks. She watches great grandchildren during the week and is very active.     Hand Dominance   Dominant Hand: Right    Extremity/Trunk Assessment   Upper Extremity Assessment Upper Extremity Assessment: LUE deficits/detail LUE Deficits / Details: decreased dexterity and speed    Lower Extremity Assessment Lower Extremity Assessment: Overall WFL for tasks assessed (L LE slightly less coordinated movements when asked to perform.)       Communication   Communication: No difficulties  Cognition Arousal/Alertness: Awake/alert Behavior During Therapy: WFL for tasks assessed/performed Overall Cognitive Status: Within Functional Limits for tasks assessed                                 General Comments: Pt reports some generalized dizziness, but not to the point that it bothers her.        General Comments      Exercises     Assessment/Plan    PT Assessment Patient needs continued PT services  PT Problem List Decreased strength;Decreased coordination;Decreased balance       PT Treatment Interventions Gait training;Therapeutic activities;Therapeutic exercise;Balance training;Neuromuscular re-education    PT Goals (Current goals can be found in the Care Plan section)  Acute Rehab PT Goals Patient Stated Goal: to go home. PT Goal Formulation: With patient Time For Goal Achievement: 11/30/21 Potential to Achieve Goals: Good    Frequency Min 2X/week     Co-evaluation               AM-PAC PT "6 Clicks" Mobility  Outcome Measure Help needed turning from your back to your side while in a flat bed without using bedrails?: None Help needed moving from lying on your back to sitting on the side of a flat bed without using bedrails?: None Help needed moving to and from a bed to a chair (including a wheelchair)?: None Help needed standing up from  a chair using your arms (e.g., wheelchair or bedside chair)?: None Help needed to walk in hospital room?: None Help needed climbing 3-5 steps with a railing? : A Little 6 Click Score: 23    End of Session Equipment Utilized During Treatment: Gait belt Activity Tolerance: Patient tolerated treatment well Patient left: in bed;with call bell/phone within reach;with nursing/sitter in room;with family/visitor present Nurse Communication: Mobility status PT Visit Diagnosis: Muscle weakness (generalized) (M62.81);Difficulty in walking, not elsewhere classified (R26.2)    Time: 8921-1941 PT Time Calculation (min) (ACUTE ONLY): 20 min   Charges:   PT Evaluation $PT Eval Low Complexity: 1 Low PT Treatments $Therapeutic Activity: 8-22 mins        Gwenlyn Saran, PT, DPT 11/16/21, 1:28 PM

## 2021-11-16 NOTE — Progress Notes (Signed)
*  PRELIMINARY RESULTS* Echocardiogram 2D Echocardiogram has been performed.  Molly Matthews 11/16/2021, 1:57 PM

## 2021-11-16 NOTE — Hospital Course (Addendum)
42F, hx of stroke, hypertension, hyperlipidemia, COPD, tobacco abuse, CAD, carotid artery stenosis (s/p of endarterectomy), presented with 3 days of slurred speech, facial droop. 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 Dr. Quinn Axe of neuro was consulted and admitted for further management.  PT OT speech consulted.  Patient underwent further work-up with echo, carotid duplex.  History of intolerance to high-dose Crestor was taking half tablet of 5 mg every other day we will do trial of slightly higher dose. Completed stroke work up with tte, mri. Anseen by Dr Quinn Axe and okay to send home on as plavix 21 days then plavix alone

## 2021-11-16 NOTE — Care Management CC44 (Signed)
Condition Code 44 Documentation Completed  Patient Details  Name: NIKOLA BLACKSTON MRN: [id number] Date of Birth: 1950-12-06   Condition Code 44 given:  Yes Patient signature on Condition Code 44 notice:  Yes Documentation of 2 MD's agreement:  Yes Code 44 added to claim:  Yes    Gerrianne Scale Othel Dicostanzo, LCSW 11/16/2021, 4:20 PM

## 2021-11-16 NOTE — Progress Notes (Signed)
SLP Cancellation Note  Patient Details Name: Molly Matthews MRN: [id number] DOB: 08-18-1950   Cancelled treatment:       Reason Eval/Treat Not Completed: SLP screened, no needs identified, will sign off ((during the BSE)) At the BSE, pt was awake, resting in room upon entering. Pt talkative, appeared slightly distracted but she denied any change in status; Husband agreed. Pt verbally conversed in general conversation, followed instructions appropriately, fed self independently sitting EOB(min support given to sit EOB). She chuckled intermittently; pleasant. No overt expressive/receptive speech deficits noted during the BSE; pt denied any speech-language deficits. Speech clear, intelligible -- pt reported her speech was at her baseline "now". No further skilled ST services indicated as pt appears at her baseline. Pt/Husband agreed. NSG to reconsult if any change in status while admitted.       Orinda Kenner, MS, CCC-SLP Speech Language Pathologist Rehab Services; [place] [phone] (ascom) Sharay Bellissimo 11/16/2021, 4:22 PM

## 2021-11-16 NOTE — Evaluation (Signed)
Clinical/Bedside Swallow Evaluation Patient Details  Name: Molly Matthews MRN: [id number] Date of Birth: 05-08-1950  Today's Date: 11/16/2021 Time: SLP Start Time (ACUTE ONLY): 3536 SLP Stop Time (ACUTE ONLY): 1443 SLP Time Calculation (min) (ACUTE ONLY): 40 min  Past Medical History:  Past Medical History:  Diagnosis Date   Anginal pain ([place])    Anxiety    Aortic atherosclerosis ([place])    Arthritis    Cancer ([place]) 01/2017   Skin cancer on right leg   Carotid artery stenosis    Carotid stenosis, left    100 % blockage   Cholelithiasis    Chronic enteritis    Chronic venous insufficiency    Colon polyps    COPD (chronic obstructive pulmonary disease) (HCC)    mild   Coronary artery disease    Depression    Diastasis recti    Diverticulosis    Diverticulosis    Dyspnea    Erosive esophagitis    Esophagitis    Fatty liver    Fatty liver disease, nonalcoholic    Gastritis    GERD (gastroesophageal reflux disease)    Headache    Herniated lumbar intervertebral disc    Herniated lumbar intervertebral disc    Hyperlipemia    Hyperlipidemia    Hypertension    IBS (irritable bowel syndrome)    Left carotid artery stenosis    Osteopenia    Osteopenia    Peptic ulcer disease    Personal history of tobacco use, presenting hazards to health 03/14/2016   SCC (squamous cell carcinoma)    Stroke ([place])    TIA's X 2   Tobacco dependence    Vitamin D deficiency    Past Surgical History:  Past Surgical History:  Procedure Laterality Date   ABDOMINAL HYSTERECTOMY     BREAST BIOPSY Left    negative 2008   CAROTID ENDARTERECTOMY     CHOLECYSTECTOMY     COLONOSCOPY W/ POLYPECTOMY     COLONOSCOPY WITH PROPOFOL N/A 07/26/2020   Procedure: COLONOSCOPY WITH PROPOFOL;  Surgeon: Toledo, Benay Pike, MD;  Location: [facility] ENDOSCOPY;  Service: Gastroenterology;  Laterality: N/A;   DILATION AND CURETTAGE OF UTERUS     ESOPHAGOGASTRODUODENOSCOPY (EGD) WITH PROPOFOL N/A 01/26/2017    Procedure: ESOPHAGOGASTRODUODENOSCOPY (EGD) WITH PROPOFOL;  Surgeon: Toledo, Benay Pike, MD;  Location: [facility] ENDOSCOPY;  Service: Gastroenterology;  Laterality: N/A;   ESOPHAGOGASTRODUODENOSCOPY (EGD) WITH PROPOFOL N/A 07/26/2020   Procedure: ESOPHAGOGASTRODUODENOSCOPY (EGD) WITH PROPOFOL;  Surgeon: Toledo, Benay Pike, MD;  Location: [facility] ENDOSCOPY;  Service: Gastroenterology;  Laterality: N/A;   HEMORRHOID SURGERY N/A 07/25/2016   Procedure: HEMORRHOIDECTOMY INTERNAL AND EXTERNAL;  Surgeon: Leonie Green, MD;  Location: [facility] ORS;  Service: General;  Laterality: N/A;   MICROLARYNGOSCOPY N/A 01/29/2019   Procedure: MICROLARYNGOSCOPY with Vocal Cord polyp removal.   No laser is needed.;  Surgeon: Clyde Canterbury, MD;  Location: [facility] ORS;  Service: ENT;  Laterality: N/A;   VASCULAR SURGERY Right    Carotid Endarterectomy    HPI:  18F, hx of previous stroke, hypertension, hyperlipidemia, COPD, tobacco abuse/use, GERD, gastritis, CAD, carotid artery stenosis (s/p of endarterectomy), presented with 3 days of slurred speech, facial droop. In ED, she underwent MRI brain found to have acute infarction of the right anterior para median Pons. Neurology was consulted and admitted for further management.  PT OT speech consulted.    Assessment / Plan / Recommendation  Clinical Impression   Pt seen for BSE today. Per chart note while in the  ED, "While drinking water, pt had episode of choking. Dr. Blaine Hamper notified. Pt made NPO and SLP eval ordered.". Pt has since been placed on a diet by MD. Husband present in room - he stated pt was lying reclined while drinking the water. At this evaluation, pt was awake, resting in room. Pt talkative, appeared slightly distracted but she denied any change in status; Husband agreed. Pt verbally conversed in general conversation, followed instructions appropriately, fed self independently sitting EOB(min support given to sit EOB). She chuckled intermittently; pleasant.    Pt appears  to present w/ adequate oropharyngeal phase swallow function w/ No oropharyngeal phase dysphagia noted, No neuromuscular deficits noted. Pt consumed po trials w/ No overt, clinical s/s of aspiration during po trials. Pt appears at reduced risk for aspiration from an oropharyngeal phase standpoint following general aspiration precautions. 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 ANY Esophageal Dysmotility or Regurgitation of Reflux material can increase risk for aspiration of the Reflux material during Retrograde backflow thus impact Voicing and Pulmonary status.  During po trials, pt consumed all consistencies w/ no overt coughing, decline in vocal quality, or change in respiratory presentation during/post trials. O2 sats remained upper 90s. Oral phase appeared [facility] w/ timely bolus management, mastication, and control of bolus propulsion for A-P transfer for swallowing. Oral clearing achieved w/ all trial consistencies. OM Exam appeared [facility] w/ no unilateral weakness noted. Speech Clear. Pt fed self.   Recommend continue a Regular consistency diet w/ well-Cut meats, moistened foods; Thin liquids. Recommend general aspiration precautions, GERD/REFLUX precautions. Pills 1 at a time w/ water vs whole in a Puree for easier, safer swallowing as pt described Larger pills causing difficulty to swallow. Education given on Pills in Puree(practiced w/ NSG x1 pill); food consistencies and easy to eat options; general aspiration precautions. NSG to reconsult if any new needs arise. Pt/Husband/NSG agreed.  SLP Visit Diagnosis: Dysphagia, unspecified (R13.10)    Aspiration Risk   (reduced following general precs.)    Diet Recommendation   Regular consistency diet w/ well-Cut meats, moistened foods; Thin liquids. Recommend general aspiration precautions, GERD/REFLUX precautions.   Medication Administration: Whole meds with liquid (vs Whole in a Puree for  ease of swallowing if needed)    Other  Recommendations Recommended Consults:  (n/a) Oral Care Recommendations: Oral care BID;Oral care before and after PO;Patient independent with oral care Other Recommendations:  (n/a)    Recommendations for follow up therapy are one component of a multi-disciplinary discharge planning process, led by the attending physician.  Recommendations may be updated based on patient status, additional functional criteria and insurance authorization.  Follow up Recommendations No SLP follow up      Assistance Recommended at Discharge None  Functional Status Assessment Patient has not had a recent decline in their functional status  Frequency and Duration  (n/a)   (n/a)       Prognosis Prognosis for Safe Diet Advancement: Good Barriers to Reach Goals: Time post onset;Severity of deficits      Swallow Study   General Date of Onset: 11/15/21 HPI: 44F, hx of previous stroke, hypertension, hyperlipidemia, COPD, tobacco abuse/use, GERD, gastritis, CAD, carotid artery stenosis (s/p of endarterectomy), presented with 3 days of slurred speech, facial droop. In ED, she underwent MRI brain found to have acute infarction of the right anterior para median Pons. Neurology was consulted and admitted for further management.  PT OT speech consulted. 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 Diet Prior to this Study: Regular;Thin liquids Temperature Spikes Noted: No Respiratory Status: Room air History of Recent Intubation: No Behavior/Cognition: Alert;Cooperative;Pleasant mood;Distractible (min) Oral Cavity Assessment: Within Functional Limits Oral Care Completed by SLP: Recent completion by staff Oral Cavity - Dentition: Adequate natural dentition Vision: Functional for self-feeding Self-Feeding Abilities:  Able to feed self Patient Positioning: Upright in bed (EOB, supported) Baseline Vocal Quality: Normal Volitional Cough: Strong Volitional Swallow: Able to elicit    Oral/Motor/Sensory Function Overall Oral Motor/Sensory Function: Within functional limits   Ice Chips Ice chips: Not tested   Thin Liquid Thin Liquid: Within functional limits Presentation: Cup;Self Fed (10+ trials)    Nectar Thick Nectar Thick Liquid: Not tested   Honey Thick Honey Thick Liquid: Not tested   Puree Puree: Within functional limits Presentation: Self Fed;Spoon (8 trials)   Solid     Solid: Within functional limits Presentation: Self Fed (10+ trials) Other Comments: including candybar         Orinda Kenner, MS, CCC-SLP Speech Language Pathologist Rehab Services; [place] [phone] (ascom)  Shalay Carder 11/16/2021,4:10 PM

## 2021-11-16 NOTE — Evaluation (Signed)
Occupational Therapy Evaluation Patient Details Name: Molly Matthews MRN: [id number] DOB: 09/30/1950 Today's Date: 11/16/2021   History of Present Illness 71 y.o. female with medical history significant of stroke, hypertension, hyperlipidemia, COPD, GERD, depression with anxiety, tobacco abuse, PAD you, ABD, gastritis, carotid artery stenosis (s/p of her left endarterectomy), CAD, fatty liver, who presents with slurred speech, dizziness, facial droop, headache and dizziness. MRI showed acute infarct R pons.   Clinical Impression   Patient presenting with decreased coordination in L UE and LE. Patient reports living at home with husband and is very independent without use of AD at baseline. She watches great grandchildren during the week. OT unable to provide stroke education to pt secondary to neurology has not spoken to pt about test results at time of evaluation. Patient currently functioning at mod I level for functional tasks without use of AD. She does have decreased speed and dexterity with L UE but remains functional. She is able to ambulate 200' without LB or AD and family reporting she appears to be at baseline for ambulation. Pt reports she just feels "weak" on L side. OT encouraged pt to mobilize and use L UE in self care tasks. Patient will benefit from acute OT to increase overall independence in the areas of ADLs, functional mobility, and safety awareness in order to safely discharge to next venue of care.      Recommendations for follow up therapy are one component of a multi-disciplinary discharge planning process, led by the attending physician.  Recommendations may be updated based on patient status, additional functional criteria and insurance authorization.   Follow Up Recommendations  No OT follow up    Assistance Recommended at Discharge PRN     Functional Status Assessment  Patient has not had a recent decline in their functional status  Equipment Recommendations  None  recommended by OT       Precautions / Restrictions Precautions Precautions: Fall      Mobility Bed Mobility Overal bed mobility: Modified Independent, Independent                  Transfers Overall transfer level: Modified independent Equipment used: None Transfers: Sit to/from Stand Sit to Stand: Modified independent (Device/Increase time)                  Balance Overall balance assessment: Modified Independent                                         ADL either performed or assessed with clinical judgement   ADL Overall ADL's : Modified independent                                             Vision Patient Visual Report: Diplopia;No change from baseline Additional Comments: Pt reports diplopia when she arrived at hospital but not during assessment            Pertinent Vitals/Pain Pain Assessment Pain Assessment: No/denies pain     Hand Dominance Right   Extremity/Trunk Assessment Upper Extremity Assessment Upper Extremity Assessment: LUE deficits/detail LUE Deficits / Details: decreased dexterity and speed   Lower Extremity Assessment Lower Extremity Assessment: Defer to PT evaluation       Communication Communication Communication: No difficulties   Cognition  Arousal/Alertness: Awake/alert Behavior During Therapy: WFL for tasks assessed/performed Overall Cognitive Status: Within Functional Limits for tasks assessed                                                  Home Living Family/patient expects to be discharged to:: Private residence Living Arrangements: Spouse/significant other Available Help at Discharge: Family Type of Home: House Home Access: Stairs to enter Technical brewer of Steps: 4 Entrance Stairs-Rails: Right;Left Home Layout: One level     Bathroom Shower/Tub: Tub/shower unit         Home Equipment: None          Prior Functioning/Environment  Prior Level of Function : Independent/Modified Independent;Driving               ADLs Comments: Pt is ind in self care and IADL tasks. She watches great grandchildren during the week and is very active.        OT Problem List: Decreased coordination      OT Treatment/Interventions: Therapeutic exercise;Therapeutic activities;Neuromuscular education;Energy conservation    OT Goals(Current goals can be found in the care plan section) Acute Rehab OT Goals Patient Stated Goal: to go home OT Goal Formulation: With patient/family Time For Goal Achievement: 11/30/21 Potential to Achieve Goals: Good ADL Goals Pt Will Transfer to Toilet: Independently Pt Will Perform Toileting - Clothing Manipulation and hygiene: Independently Pt Will Perform Tub/Shower Transfer: Tub transfer;Independently Pt/caregiver will Perform Home Exercise Program: Increased strength;Left upper extremity;With theraputty;With written HEP provided;Independently  OT Frequency: Min 2X/week       AM-PAC OT "6 Clicks" Daily Activity     Outcome Measure Help from another person eating meals?: None Help from another person taking care of personal grooming?: None Help from another person toileting, which includes using toliet, bedpan, or urinal?: None Help from another person bathing (including washing, rinsing, drying)?: None Help from another person to put on and taking off regular upper body clothing?: None Help from another person to put on and taking off regular lower body clothing?: None 6 Click Score: 24   End of Session Nurse Communication: Mobility status  Activity Tolerance: Patient tolerated treatment well Patient left: in bed;with call bell/phone within reach;with family/visitor present;Other (comment) (MD present in room)  OT Visit Diagnosis: Unsteadiness on feet (R26.81);Hemiplegia and hemiparesis Hemiplegia - Right/Left: Left Hemiplegia - dominant/non-dominant: Non-Dominant Hemiplegia - caused by:  Cerebral infarction                Time: 3818-2993 OT Time Calculation (min): 23 min Charges:  OT General Charges $OT Visit: 1 Visit OT Evaluation $OT Eval Low Complexity: 1 Low OT Treatments $Self Care/Home Management : 8-22 mins  Darleen Crocker, MS, OTR/L , CBIS ascom [phone]  11/16/21, 11:43 AM

## 2021-11-16 NOTE — Discharge Summary (Signed)
Physician Discharge Summary  Molly Matthews GGY:[id number] DOB: 12/16/1950 DOA: 11/15/2021  PCP: Sherre Scarlet, PA-C  Admit date: 11/15/2021 Discharge date: 11/16/2021 Recommendations for Outpatient Follow-up:  Follow up with PCP and w/ neurology as op  Discharge Dispo: home Discharge Condition: Stable Code Status:   Code Status: Full Code Diet recommendation:  Diet Order             Diet Heart Room service appropriate? Yes; Fluid consistency: Thin  Diet effective now                    Brief/Interim Summary: 71F, hx of stroke, hypertension, hyperlipidemia, COPD, tobacco abuse, CAD, carotid artery stenosis (s/p of endarterectomy), presented with 3 days of slurred speech, facial droop. 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 Dr. Quinn Axe of neuro was consulted and admitted for further management.  PT OT speech consulted.  Patient underwent further work-up with echo, carotid duplex.  History of intolerance to high-dose Crestor was taking half tablet of 5 mg every other day we will do trial of slightly higher dose. Completed stroke work up with tte, mri. Anseen by Dr Quinn Axe and okay to send home on as plavix 21 days then plavix alone         Discharge Diagnoses:  Principal Problem:   Stroke [facility]) Active Problems:   Carotid stenosis   Hypertension   Hyperlipidemia   Depression   COPD (chronic obstructive pulmonary disease) ([place])   Coronary artery disease   Tobacco dependence   PUD (peptic ulcer disease)  Acute anterior paramedian pontine stroke Moderate stenosis right ICA: Presenting with 3 days of facial droop speech slurring.  MRI shows acute stroke as above with left right ICA and moderate artery disease.LDL not at goal 110 HbA1c stable 5.1.  BP stable 150s today.  Await further neurology input.  Only has mild left coordination issues.On aspirin Plavix 75, started Zetia 10 previous intolerance to statin high dose we  will resume Crestor at 5 mg. Follow-up TTE-shows EF 60 to 65%, G1 DD mitral valve aortic valve normal in structure.  Although poor windows for evaluation of left ventricular function.  Seen by SLP, PT OT and no further needs. 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 See #1, plan per neurology  Hypertension: 150s to 180s, instead of 10, will send her on amlodipine 5 mg avoid aggressive reduction- hold bp meds if sbp < 130-140- instruction provided , follow up with pcp to adjust and titrate meds  HyperlipidemiaStarted zetia. Increased crestor if she can tolerate0 if not then go back to 5 mg 1/2 tab every other day schedule Depression:Continue home medications COPD: Not in  exacerbation.Cont prn albuterol Coronary artery disease,No chest pain. Cont Aspirin, Plavix, Zetia Tobacco dependence: Counseling nicotine patch PUD hx- Cont PPI  Consults: Dr Quinn Axe Subjective: Aaox3, feels well, mild left sided coordination issues.  Discharge Exam: Vitals:   11/16/21 0921 11/16/21 1210  BP: (!) 153/67 (!) 163/65  Pulse: 73 67  Resp: 18 16  Temp: 97.6 F (36.4 C) 98.2 F (36.8 C)  SpO2: 94% 94%   General: Pt is alert, awake, not in acute distress Cardiovascular: RRR, S1/S2 +, no  rubs, no gallops Respiratory: CTA bilaterally, no wheezing, no rhonchi Abdominal: Soft, NT, ND, bowel sounds + Extremities: no edema, no cyanosis  Discharge Instructions  Discharge Instructions     Ambulatory referral to Neurology   Complete by: As directed    An appointment is requested in approximately: 2 weeks   Discharge instructions   Complete by: As directed    Follow with pcp to monitor bp, if  blood pressure is < 130-140 do not use amlodipine. Follow up with neurology as well  Please call call MD or return to ER for similar or worsening recurring problem that brought you to hospital or if any fever,nausea/vomiting,abdominal pain, uncontrolled pain, chest pain,  shortness of breath or any other alarming symptoms.  If you experience myalgias etcs please go back to previous dosing of crestor, for now we have made it daily.  Please follow-up your doctor as instructed in a week time and call the office for appointment.  Please avoid alcohol, smoking, or any other illicit substance and maintain healthy habits including taking your regular medications as prescribed.  You were cared for by a hospitalist during your hospital stay. If you have any questions about your discharge medications or the care you received while you were in the hospital after you are discharged, you can call the unit and ask to speak with the hospitalist on call if the hospitalist that took care of you is not available.  Once you are discharged, your primary care physician will handle any further medical issues. Please note that NO REFILLS for any discharge medications will be authorized once you are discharged, as it is imperative that you return to your primary care physician (or establish a relationship with a primary care physician if you do not have one) for your aftercare needs so that they can reassess your need for medications and monitor your lab values   Increase activity slowly   Complete by: As directed       Allergies as of 11/16/2021       Reactions   Buspirone    Other reaction(s): Headache   Chantix [varenicline Tartrate] Other (See Comments)   Jittery muscle movements   Statins Other (See Comments)   Muscle and joint aches - can take in small doses   Sulfa Antibiotics    Other reaction(s): Unknown   Zithromax [azithromycin] Other (See Comments)   Back Pain   Elemental Sulfur Rash         Medication List     STOP taking these medications    Anoro Ellipta 62.5-25 MCG/ACT Aepb Generic drug: umeclidinium-vilanterol   levETIRAcetam 500 MG tablet Commonly known as: Keppra   rosuvastatin 5 MG tablet Commonly known as: CRESTOR   topiramate 25 MG tablet Commonly known as: TOPAMAX   Trelegy Ellipta 100-62.5-25 MCG/ACT Aepb Generic drug: Fluticasone-Umeclidin-Vilant       TAKE these medications    acetaminophen 500 MG tablet Commonly known as: TYLENOL Take 1,000 mg by mouth every 6 (six) hours as needed.   albuterol 108 (90 Base) MCG/ACT inhaler Commonly known as: VENTOLIN HFA Inhale 2 puffs into the lungs every 6 (six) hours as needed for wheezing or shortness of breath.   amLODipine 5 MG tablet Commonly known as: NORVASC Take 1 tablet (5 mg total) by mouth daily. Start taking on: November 17, 2021   aspirin EC 81 MG tablet Take 1 tablet (81 mg total) by mouth daily for 21 days. Swallow whole.   citalopram 40 MG tablet  Commonly known as: CELEXA Take 40 mg by mouth at bedtime.   clopidogrel 75 MG tablet Commonly known as: PLAVIX Take 75 mg by mouth daily.   ergocalciferol 1.25 MG (50000 UT) capsule Commonly known as: VITAMIN D2 Take 50,000 Units by mouth once a week.   escitalopram 20 MG tablet Commonly known as: LEXAPRO Take 20 mg by mouth daily.   ezetimibe 10 MG tablet Commonly known as: ZETIA Take 1 tablet (10 mg total) by mouth daily. Start taking on: November 17, 2021   pantoprazole 40 MG tablet Commonly known as: PROTONIX Take 40 mg by mouth daily.        Allergies  Allergen Reactions   Buspirone     Other reaction(s): Headache   Chantix [Varenicline Tartrate] Other (See Comments)    Jittery muscle movements   Statins Other (See Comments)    Muscle and joint aches - can take in small doses   Sulfa Antibiotics     Other reaction(s): Unknown   Zithromax [Azithromycin] Other (See Comments)    Back Pain   Elemental Sulfur Rash     The results of significant diagnostics from this hospitalization (including imaging, microbiology, ancillary and laboratory) are listed below for reference.    Microbiology: No results found for this or any previous visit (from the past 240 hour(s)).  Procedures/Studies: ECHOCARDIOGRAM COMPLETE  Result Date: 11/16/2021    ECHOCARDIOGRAM REPORT   Patient Name:   Molly Matthews Date of Exam: 11/16/2021 Medical Rec #:  [id number]       Height:       62.0 in Accession #:    [id number]      Weight:       145.0 lb Date of Birth:  28-Sep-1950        BSA:          1.667 m Patient Age:    32 years        BP:           163/65 mmHg Patient Gender: F               HR:           67 bpm. Exam Location:  [facility] Procedure: 2D Echo, Cardiac Doppler and Color Doppler Indications:     Stroke I63.9  History:         Patient has no prior history of Echocardiogram examinations.                  COPD; Signs/Symptoms:Dyspnea. Anxiety.  Sonographer:     Sherrie Sport Referring Phys:  [id number] Soledad Gerlach NIU Diagnosing Phys: Yolonda Kida MD  Sonographer Comments: Suboptimal apical window and suboptimal parasternal window. IMPRESSIONS  1. Left ventricular ejection fraction, by estimation, is 60 to 65%. The left ventricle has normal function. The left ventricle has no regional wall motion abnormalities. There is mild left ventricular hypertrophy. Left ventricular diastolic parameters are consistent with Grade I diastolic dysfunction (impaired relaxation).  2. Right ventricular systolic function is normal. The right ventricular size is normal.  3. The mitral valve is normal in structure. Trivial mitral valve regurgitation.  4. The aortic valve is normal in structure. Aortic valve regurgitation is not visualized. Conclusion(s)/Recommendation(s): Poor windows for evaluation of left ventricular function by transthoracic echocardiography. Would recommend an alternative means of evaluation. FINDINGS  Left Ventricle: Left ventricular ejection  fraction, by estimation, is 60 to 65%. The left ventricle has normal function. The left ventricle has no regional wall motion abnormalities.  The left ventricular internal cavity size was normal in size. There is  mild left ventricular hypertrophy. Left ventricular diastolic parameters are consistent with Grade I diastolic dysfunction (impaired relaxation). Right Ventricle: The right ventricular size is normal. No increase in right ventricular wall thickness. Right ventricular systolic function is normal. Left Atrium: Left atrial size was normal in size. Right Atrium: Right atrial size was normal in size. Pericardium: There is no evidence of pericardial effusion. Mitral Valve: The mitral valve is normal in structure. Trivial mitral valve regurgitation. Tricuspid Valve: The tricuspid valve is normal in structure. Tricuspid valve regurgitation is mild. Aortic Valve: The aortic valve is normal in structure. Aortic valve regurgitation is not visualized. Aortic valve mean gradient measures 4.5 mmHg. Aortic valve peak gradient measures 9.1 mmHg. Aortic valve area, by VTI measures 3.10 cm. Pulmonic Valve: The pulmonic valve was normal in structure. Pulmonic valve regurgitation is not visualized. Aorta: The ascending aorta was not well visualized. IAS/Shunts: No atrial level shunt detected by color flow Doppler.  LEFT VENTRICLE PLAX 2D LVIDd:         4.10 cm   Diastology LVIDs:         2.70 cm   LV e' medial:    3.70 cm/s LV PW:         1.30 cm   LV E/e' medial:  29.2 LV IVS:        1.50 cm   LV e' lateral:   4.57 cm/s LVOT diam:     2.20 cm   LV E/e' lateral: 23.6 LV SV:         86 LV SV Index:   51 LVOT Area:     3.80 cm  RIGHT VENTRICLE RV Basal diam:  3.10 cm RV S prime:     10.70 cm/s TAPSE (M-mode): 2.6 cm LEFT ATRIUM           Index        RIGHT ATRIUM           Index LA diam:      3.30 cm 1.98 cm/m   RA Area:     11.30 cm LA Vol (A2C): 67.6 ml 40.54 ml/m  RA Volume:   24.20 ml  14.51 ml/m LA Vol (A4C): 33.0 ml  19.79 ml/m  AORTIC VALVE AV Area (Vmax):    2.59 cm AV Area (Vmean):   2.60 cm AV Area (VTI):     3.10 cm AV Vmax:           151.00 cm/s AV Vmean:          98.150 cm/s AV VTI:            0.276 m AV Peak Grad:      9.1 mmHg AV Mean Grad:      4.5 mmHg LVOT Vmax:         103.00 cm/s LVOT Vmean:        67.200 cm/s LVOT VTI:          0.225 m LVOT/AV VTI ratio: 0.82  AORTA Ao Root diam: 2.40 cm MITRAL VALVE                TRICUSPID VALVE MV Area (PHT): 2.83 cm     TR Peak grad:   8.9 mmHg MV Decel Time: 268 msec     TR Vmax:        149.00 cm/s MV E velocity: 108.00 cm/s MV A velocity: 116.00 cm/s  SHUNTS MV E/A ratio:  0.93         Systemic VTI:  0.22 m                             Systemic Diam: 2.20 cm Yolonda Kida MD Electronically signed by Yolonda Kida MD Signature Date/Time: 11/16/2021/2:22:12 PM    Final    MR ANGIO NECK WO CONTRAST  Result Date: 11/15/2021 CLINICAL DATA:  Neuro deficit, acute, stroke suspected. Slurred speech, dizziness and headache. EXAM: MRI HEAD WITHOUT CONTRAST MRA HEAD WITHOUT CONTRAST MRA NECK WITHOUT CONTRAST TECHNIQUE: Multiplanar, multiecho pulse sequences of the brain and surrounding structures were obtained without intravenous contrast. Angiographic images of the Circle of Willis were obtained using MRA technique without intravenous contrast. Angiographic images of the neck were obtained using MRA technique without intravenous contrast. Carotid stenosis measurements (when applicable) are obtained utilizing NASCET criteria, using the distal internal carotid diameter as the denominator. COMPARISON:  Head CT earlier same day FINDINGS: MRI HEAD FINDINGS Brain: Diffusion imaging shows acute stroke in the anterior right para median pons. No evidence of hemorrhage or swelling. No other acute finding. Chronic small-vessel ischemic changes seen elsewhere within the pons. Old small vessel infarction of the left cerebellum. Chronic small-vessel ischemic changes of the thalami,  basal ganglia and hemispheric white matter. Old cortical and subcortical infarction of the left frontal operculum with encephalomalacia. No evidence of mass, obstructive hydrocephalus or extra-axial collection. Vascular: Abnormal flow in the left internal carotid artery. Skull and upper cervical spine: Negative Sinuses/Orbits: Clear/normal Other: None MRA HEAD FINDINGS Right internal carotid artery is widely patent through the skull base. Moderate siphon atherosclerotic irregularity and stenosis. The right middle cerebral artery is patent. More distal MCA branches do show some atherosclerotic irregularity. Focal stenosis at the origin of the right anterior cerebral artery. No antegrade flow in the left internal carotid artery. Flow to the left anterior and middle cerebral vessels occurs by patent anterior and posterior communicating arteries. Dominant right vertebral artery is widely patent to the basilar. Left vertebral artery supplies PICA and is then occluded. No basilar stenosis. Right superior cerebellar artery appears normal. Left superior cerebellar artery appears narrow and irregular. Flow is present in both posterior cerebral arteries. There is distal vessel atherosclerotic irregularity, right worse than left. MRA NECK FINDINGS Right common carotid artery widely patent to the bifurcation. Mild atherosclerotic irregularity of the carotid bifurcation and ICA bulb but no stenosis. Left common carotid artery is a severely disease and irregular vessel. There is some flow to the bifurcation, but the ICA is occluded at the bifurcation. Vertebral artery origins are not visible due to technical factors. There is antegrade flow in both vertebral arteries with the right being dominant. IMPRESSION: Acute infarction of the right anterior para median pons. No swelling or hemorrhage. Extensive chronic small-vessel ischemic changes elsewhere throughout the brain. Old cortical and subcortical infarction in the left frontal  operculum. Severely disease left common carotid artery. Left internal carotid artery is occluded at its origin. Right common carotid artery and internal carotid artery are widely patent to the skull base. Moderate stenosis of the right ICA in the siphon. Flow to the left intracranial anterior circulation occurs via patent anterior and posterior communicating arteries. Distal vessel intracranial atherosclerotic irregularity diffusely. 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 Dominant right vertebral artery supplies the basilar. Electronically Signed   By: Nelson Chimes M.D.   On: 11/15/2021 16:54   MR ANGIO HEAD WO  CONTRAST  Result Date: 11/15/2021 CLINICAL DATA:  Neuro deficit, acute, stroke suspected. Slurred speech, dizziness and headache. EXAM: MRI HEAD WITHOUT CONTRAST MRA HEAD WITHOUT CONTRAST MRA NECK WITHOUT CONTRAST TECHNIQUE: Multiplanar, multiecho pulse sequences of the brain and surrounding structures were obtained without intravenous contrast. Angiographic images of the Circle of Willis were obtained using MRA technique without intravenous contrast. Angiographic images of the neck were obtained using MRA technique without intravenous contrast. Carotid stenosis measurements (when applicable) are obtained utilizing NASCET criteria, using the distal internal carotid diameter as the denominator. COMPARISON:  Head CT earlier same day FINDINGS: MRI HEAD FINDINGS Brain: Diffusion imaging shows acute stroke in the anterior right para median pons. No evidence of hemorrhage or swelling. No other acute finding. Chronic small-vessel ischemic changes seen elsewhere within the pons. Old small vessel infarction of the left cerebellum. Chronic small-vessel ischemic changes of the thalami, basal ganglia and hemispheric white matter. Old cortical and subcortical infarction of the left frontal operculum with encephalomalacia. No evidence of mass, obstructive hydrocephalus or extra-axial collection. Vascular:  Abnormal flow in the left internal carotid artery. Skull and upper cervical spine: Negative Sinuses/Orbits: Clear/normal Other: None MRA HEAD FINDINGS Right internal carotid artery is widely patent through the skull base. Moderate siphon atherosclerotic irregularity and stenosis. The right middle cerebral artery is patent. More distal MCA branches do show some atherosclerotic irregularity. Focal stenosis at the origin of the right anterior cerebral artery. No antegrade flow in the left internal carotid artery. Flow to the left anterior and middle cerebral vessels occurs by patent anterior and posterior communicating arteries. Dominant right vertebral artery is widely patent to the basilar. Left vertebral artery supplies PICA and is then occluded. No basilar stenosis. Right superior cerebellar artery appears normal. Left superior cerebellar artery appears narrow and irregular. Flow is present in both posterior cerebral arteries. There is distal vessel atherosclerotic irregularity, right worse than left. MRA NECK FINDINGS Right common carotid artery widely patent to the bifurcation. Mild atherosclerotic irregularity of the carotid bifurcation and ICA bulb but no stenosis. Left common carotid artery is a severely disease and irregular vessel. There is some flow to the bifurcation, but the ICA is occluded at the bifurcation. Vertebral artery origins are not visible due to technical factors. There is antegrade flow in both vertebral arteries with the right being dominant. IMPRESSION: Acute infarction of the right anterior para median pons. No swelling or hemorrhage. Extensive chronic small-vessel ischemic changes elsewhere throughout the brain. Old cortical and subcortical infarction in the left frontal operculum. Severely disease left common carotid artery. Left internal carotid artery is occluded at its origin. Right common carotid artery and internal carotid artery are widely patent to the skull base. Moderate  stenosis of the right ICA in the siphon. Flow to the left intracranial anterior circulation occurs via patent anterior and posterior communicating arteries. Distal vessel intracranial atherosclerotic irregularity diffusely. 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 Dominant right vertebral artery supplies the basilar. Electronically Signed   By: Nelson Chimes M.D.   On: 11/15/2021 16:54   MR BRAIN WO CONTRAST  Result Date: 11/15/2021 CLINICAL DATA:  Neuro deficit, acute, stroke suspected. Slurred speech, dizziness and headache. EXAM: MRI HEAD WITHOUT CONTRAST MRA HEAD WITHOUT CONTRAST MRA NECK WITHOUT CONTRAST TECHNIQUE: Multiplanar, multiecho pulse sequences of the brain and surrounding structures were obtained without intravenous contrast. Angiographic images of the Circle of Willis were obtained using MRA technique without intravenous contrast. Angiographic images of the neck were obtained using MRA technique without  intravenous contrast. Carotid stenosis measurements (when applicable) are obtained utilizing NASCET criteria, using the distal internal carotid diameter as the denominator. COMPARISON:  Head CT earlier same day FINDINGS: MRI HEAD FINDINGS Brain: Diffusion imaging shows acute stroke in the anterior right para median pons. No evidence of hemorrhage or swelling. No other acute finding. Chronic small-vessel ischemic changes seen elsewhere within the pons. Old small vessel infarction of the left cerebellum. Chronic small-vessel ischemic changes of the thalami, basal ganglia and hemispheric white matter. Old cortical and subcortical infarction of the left frontal operculum with encephalomalacia. No evidence of mass, obstructive hydrocephalus or extra-axial collection. Vascular: Abnormal flow in the left internal carotid artery. Skull and upper cervical spine: Negative Sinuses/Orbits: Clear/normal Other: None MRA HEAD FINDINGS Right internal carotid artery is widely patent through the skull base.  Moderate siphon atherosclerotic irregularity and stenosis. The right middle cerebral artery is patent. More distal MCA branches do show some atherosclerotic irregularity. Focal stenosis at the origin of the right anterior cerebral artery. No antegrade flow in the left internal carotid artery. Flow to the left anterior and middle cerebral vessels occurs by patent anterior and posterior communicating arteries. Dominant right vertebral artery is widely patent to the basilar. Left vertebral artery supplies PICA and is then occluded. No basilar stenosis. Right superior cerebellar artery appears normal. Left superior cerebellar artery appears narrow and irregular. Flow is present in both posterior cerebral arteries. There is distal vessel atherosclerotic irregularity, right worse than left. MRA NECK FINDINGS Right common carotid artery widely patent to the bifurcation. Mild atherosclerotic irregularity of the carotid bifurcation and ICA bulb but no stenosis. Left common carotid artery is a severely disease and irregular vessel. There is some flow to the bifurcation, but the ICA is occluded at the bifurcation. Vertebral artery origins are not visible due to technical factors. There is antegrade flow in both vertebral arteries with the right being dominant. IMPRESSION: Acute infarction of the right anterior para median pons. No swelling or hemorrhage. Extensive chronic small-vessel ischemic changes elsewhere throughout the brain. Old cortical and subcortical infarction in the left frontal operculum. Severely disease left common carotid artery. Left internal carotid artery is occluded at its origin. Right common carotid artery and internal carotid artery are widely patent to the skull base. Moderate stenosis of the right ICA in the siphon. Flow to the left intracranial anterior circulation occurs via patent anterior and posterior communicating arteries. Distal vessel intracranial atherosclerotic irregularity diffusely. Left  vertebral artery is occluded after PICA. Dominant right vertebral artery supplies the basilar. Electronically Signed   By: Nelson Chimes M.D.   On: 11/15/2021 16:54   CT HEAD WO CONTRAST  Result Date: 11/15/2021 CLINICAL DATA:  Slurred speech, dizziness, headache EXAM: CT HEAD WITHOUT CONTRAST TECHNIQUE: Contiguous axial images were obtained from the base of the skull through the vertex without intravenous contrast. RADIATION DOSE REDUCTION: This exam was performed according to the departmental dose-optimization program which includes automated exposure control, adjustment of the mA and/or kV according to patient size and/or use of iterative reconstruction technique. COMPARISON:  12/31/2020 FINDINGS: Brain: No evidence of acute infarction, hemorrhage, cerebral edema, mass, mass effect, or midline shift. No hydrocephalus or extra-axial fluid collection. 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 Periventricular white matter changes, likely the sequela of chronic small vessel ischemic disease. Vascular: No hyperdense vessel. Atherosclerotic calcifications in the intracranial carotid and vertebral arteries. Skull: Negative for fracture or focal  lesion. Sinuses/Orbits: Mucosal thickening in the ethmoid air cells. No acute finding in the orbits. Other: The mastoid air cells are well aerated. IMPRESSION: No acute intracranial process. Electronically Signed   By: Merilyn Baba M.D.   On: 11/15/2021 11:56    Labs: BNP (last 3 results) No results for input(s): "BNP" in the last 8760 hours. Basic Metabolic Panel: Recent Labs  Lab 11/15/21 1131  NA 141  K 4.1  CL 108  CO2 28  GLUCOSE 111*  BUN 13  CREATININE 0.68  CALCIUM 9.6   Liver Function Tests: Recent Labs  Lab 11/15/21 1131  AST 19  ALT 18  ALKPHOS 60  BILITOT 1.0  PROT 7.5  ALBUMIN 4.2   No results for input(s): "LIPASE",  "AMYLASE" in the last 168 hours. No results for input(s): "AMMONIA" in the last 168 hours. CBC: Recent Labs  Lab 11/15/21 1131  WBC 9.1  NEUTROABS 5.7  HGB 16.0*  HCT 47.5*  MCV 94.1  PLT 221   Cardiac Enzymes: No results for input(s): "CKTOTAL", "CKMB", "CKMBINDEX", "TROPONINI" in the last 168 hours. BNP: Invalid input(s): "POCBNP" CBG: No results for input(s): "GLUCAP" in the last 168 hours. D-Dimer No results for input(s): "DDIMER" in the last 72 hours. Hgb A1c Recent Labs    11/15/21 1131  HGBA1C 5.1   Lipid Profile Recent Labs    11/16/21 0430  CHOL 169  HDL 28*  LDLCALC 110*  TRIG 154*  CHOLHDL 6.0   Thyroid function studies No results for input(s): "TSH", "T4TOTAL", "T3FREE", "THYROIDAB" in the last 72 hours.  Invalid input(s): "FREET3" Anemia work up No results for input(s): "VITAMINB12", "FOLATE", "FERRITIN", "TIBC", "IRON", "RETICCTPCT" in the last 72 hours. Urinalysis    Component Value Date/Time   COLORURINE YELLOW (A) 04/25/2018 1713   APPEARANCEUR CLEAR (A) 04/25/2018 1713   APPEARANCEUR Cloudy 01/29/2014 2124   LABSPEC 1.004 (L) 04/25/2018 1713   LABSPEC 1.024 01/29/2014 2124   PHURINE 6.0 04/25/2018 1713   GLUCOSEU NEGATIVE 04/25/2018 1713   GLUCOSEU Negative 01/29/2014 2124   HGBUR NEGATIVE 04/25/2018 1713   BILIRUBINUR NEGATIVE 04/25/2018 1713   BILIRUBINUR Negative 01/29/2014 2124   KETONESUR NEGATIVE 04/25/2018 1713   PROTEINUR NEGATIVE 04/25/2018 1713   NITRITE NEGATIVE 04/25/2018 1713   LEUKOCYTESUR NEGATIVE 04/25/2018 1713   LEUKOCYTESUR 2+ 01/29/2014 2124   Sepsis Labs Recent Labs  Lab 11/15/21 1131  WBC 9.1   Microbiology No results found for this or any previous visit (from the past 240 hour(s)).   Time coordinating discharge: 25 minutes  SIGNED: Antonieta Pert, MD  [facility] 11/16/2021, 3:12 PM  If 7PM-7AM, please contact night-coverage [email or web address]

## 2021-11-22 ENCOUNTER — Other Ambulatory Visit: Payer: Medicare Other

## 2022-01-03 ENCOUNTER — Other Ambulatory Visit (HOSPITAL_COMMUNITY): Payer: Self-pay | Admitting: Physical Medicine & Rehabilitation

## 2022-01-03 ENCOUNTER — Other Ambulatory Visit: Payer: Self-pay | Admitting: Physical Medicine & Rehabilitation

## 2022-01-03 DIAGNOSIS — M542 Cervicalgia: Secondary | ICD-10-CM

## 2022-01-06 ENCOUNTER — Other Ambulatory Visit: Payer: Self-pay | Admitting: Physical Medicine & Rehabilitation

## 2022-01-06 DIAGNOSIS — G8929 Other chronic pain: Secondary | ICD-10-CM

## 2022-01-11 ENCOUNTER — Ambulatory Visit
Admission: RE | Admit: 2022-01-11 | Discharge: 2022-01-11 | Disposition: A | Discharge status: Home / Self Care | Payer: Medicare Other | Source: Ambulatory Visit | Attending: Physical Medicine & Rehabilitation | Admitting: Physical Medicine & Rehabilitation

## 2022-01-11 DIAGNOSIS — G8929 Other chronic pain: Secondary | ICD-10-CM

## 2022-01-11 DIAGNOSIS — M542 Cervicalgia: Secondary | ICD-10-CM | POA: Diagnosis present

## 2022-01-11 DIAGNOSIS — M5441 Lumbago with sciatica, right side: Secondary | ICD-10-CM | POA: Diagnosis present

## 2022-02-27 ENCOUNTER — Encounter (INDEPENDENT_AMBULATORY_CARE_PROVIDER_SITE_OTHER): Payer: Self-pay

## 2022-03-03 ENCOUNTER — Other Ambulatory Visit: Payer: Self-pay | Admitting: Physician Assistant

## 2022-03-03 DIAGNOSIS — Z1231 Encounter for screening mammogram for malignant neoplasm of breast: Secondary | ICD-10-CM

## 2022-03-15 ENCOUNTER — Inpatient Hospital Stay
Admission: EM | Admit: 2022-03-15 | Discharge: 2022-03-17 | DRG: 193 | Disposition: A | Discharge status: Home / Self Care | Payer: Medicare Other | Attending: Internal Medicine | Admitting: Internal Medicine

## 2022-03-15 ENCOUNTER — Emergency Department: Payer: Medicare Other

## 2022-03-15 ENCOUNTER — Other Ambulatory Visit: Payer: Self-pay

## 2022-03-15 ENCOUNTER — Encounter: Payer: Self-pay | Admitting: Family Medicine

## 2022-03-15 DIAGNOSIS — Z881 Allergy status to other antibiotic agents status: Secondary | ICD-10-CM

## 2022-03-15 DIAGNOSIS — K219 Gastro-esophageal reflux disease without esophagitis: Secondary | ICD-10-CM | POA: Diagnosis present

## 2022-03-15 DIAGNOSIS — I10 Essential (primary) hypertension: Secondary | ICD-10-CM | POA: Diagnosis present

## 2022-03-15 DIAGNOSIS — J159 Unspecified bacterial pneumonia: Principal | ICD-10-CM | POA: Diagnosis present

## 2022-03-15 DIAGNOSIS — F32A Depression, unspecified: Secondary | ICD-10-CM | POA: Diagnosis present

## 2022-03-15 DIAGNOSIS — Z8673 Personal history of transient ischemic attack (TIA), and cerebral infarction without residual deficits: Secondary | ICD-10-CM | POA: Diagnosis not present

## 2022-03-15 DIAGNOSIS — J9601 Acute respiratory failure with hypoxia: Secondary | ICD-10-CM | POA: Diagnosis present

## 2022-03-15 DIAGNOSIS — Z1152 Encounter for screening for COVID-19: Secondary | ICD-10-CM

## 2022-03-15 DIAGNOSIS — I2510 Atherosclerotic heart disease of native coronary artery without angina pectoris: Secondary | ICD-10-CM | POA: Diagnosis present

## 2022-03-15 DIAGNOSIS — Z7902 Long term (current) use of antithrombotics/antiplatelets: Secondary | ICD-10-CM

## 2022-03-15 DIAGNOSIS — B9789 Other viral agents as the cause of diseases classified elsewhere: Secondary | ICD-10-CM | POA: Diagnosis present

## 2022-03-15 DIAGNOSIS — Z8249 Family history of ischemic heart disease and other diseases of the circulatory system: Secondary | ICD-10-CM

## 2022-03-15 DIAGNOSIS — Z79899 Other long term (current) drug therapy: Secondary | ICD-10-CM

## 2022-03-15 DIAGNOSIS — J441 Chronic obstructive pulmonary disease with (acute) exacerbation: Secondary | ICD-10-CM | POA: Diagnosis present

## 2022-03-15 DIAGNOSIS — F17210 Nicotine dependence, cigarettes, uncomplicated: Secondary | ICD-10-CM | POA: Diagnosis present

## 2022-03-15 DIAGNOSIS — Z882 Allergy status to sulfonamides status: Secondary | ICD-10-CM | POA: Diagnosis not present

## 2022-03-15 DIAGNOSIS — E785 Hyperlipidemia, unspecified: Secondary | ICD-10-CM | POA: Diagnosis present

## 2022-03-15 DIAGNOSIS — Z85828 Personal history of other malignant neoplasm of skin: Secondary | ICD-10-CM | POA: Diagnosis not present

## 2022-03-15 DIAGNOSIS — J189 Pneumonia, unspecified organism: Secondary | ICD-10-CM | POA: Diagnosis present

## 2022-03-15 DIAGNOSIS — K760 Fatty (change of) liver, not elsewhere classified: Secondary | ICD-10-CM | POA: Diagnosis present

## 2022-03-15 DIAGNOSIS — K449 Diaphragmatic hernia without obstruction or gangrene: Secondary | ICD-10-CM | POA: Diagnosis present

## 2022-03-15 DIAGNOSIS — E876 Hypokalemia: Secondary | ICD-10-CM | POA: Diagnosis present

## 2022-03-15 DIAGNOSIS — I639 Cerebral infarction, unspecified: Secondary | ICD-10-CM | POA: Diagnosis present

## 2022-03-15 DIAGNOSIS — J440 Chronic obstructive pulmonary disease with acute lower respiratory infection: Secondary | ICD-10-CM | POA: Diagnosis present

## 2022-03-15 DIAGNOSIS — Z888 Allergy status to other drugs, medicaments and biological substances status: Secondary | ICD-10-CM | POA: Diagnosis not present

## 2022-03-15 LAB — CBC WITH DIFFERENTIAL/PLATELET
Abs Immature Granulocytes: 0.04 10*3/uL (ref 0.00–0.07)
Basophils Absolute: 0.1 10*3/uL (ref 0.0–0.1)
Basophils Relative: 1 %
Eosinophils Absolute: 0.2 10*3/uL (ref 0.0–0.5)
Eosinophils Relative: 3 %
HCT: 38.2 % (ref 36.0–46.0)
Hemoglobin: 13.1 g/dL (ref 12.0–15.0)
Immature Granulocytes: 0 %
Lymphocytes Relative: 23 %
Lymphs Abs: 2 10*3/uL (ref 0.7–4.0)
MCH: 32 pg (ref 26.0–34.0)
MCHC: 34.3 g/dL (ref 30.0–36.0)
MCV: 93.2 fL (ref 80.0–100.0)
Monocytes Absolute: 0.9 10*3/uL (ref 0.1–1.0)
Monocytes Relative: 10 %
Neutro Abs: 5.7 10*3/uL (ref 1.7–7.7)
Neutrophils Relative %: 63 %
Platelets: 219 10*3/uL (ref 150–400)
RBC: 4.1 MIL/uL (ref 3.87–5.11)
RDW: 14.9 % (ref 11.5–15.5)
WBC: 9 10*3/uL (ref 4.0–10.5)
nRBC: 0 % (ref 0.0–0.2)

## 2022-03-15 LAB — COMPREHENSIVE METABOLIC PANEL
ALT: 21 U/L (ref 0–44)
AST: 23 U/L (ref 15–41)
Albumin: 3.9 g/dL (ref 3.5–5.0)
Alkaline Phosphatase: 65 U/L (ref 38–126)
Anion gap: 6 (ref 5–15)
BUN: 11 mg/dL (ref 8–23)
CO2: 26 mmol/L (ref 22–32)
Calcium: 9.1 mg/dL (ref 8.9–10.3)
Chloride: 111 mmol/L (ref 98–111)
Creatinine, Ser: 0.76 mg/dL (ref 0.44–1.00)
GFR, Estimated: >60 mL/min (ref >60)
Glucose, Bld: 139 mg/dL — ABNORMAL HIGH (ref 70–99)
Potassium: 3.3 mmol/L — ABNORMAL LOW (ref 3.5–5.1)
Sodium: 143 mmol/L (ref 135–145)
Total Bilirubin: 0.7 mg/dL (ref 0.3–1.2)
Total Protein: 7.4 g/dL (ref 6.5–8.1)

## 2022-03-15 LAB — BRAIN NATRIURETIC PEPTIDE: B Natriuretic Peptide: 108.9 pg/mL — ABNORMAL HIGH (ref 0.0–100.0)

## 2022-03-15 LAB — RESP PANEL BY RT-PCR (FLU A&B, COVID) ARPGX2
Influenza A by PCR: NEGATIVE
Influenza B by PCR: NEGATIVE
SARS Coronavirus 2 by RT PCR: NEGATIVE

## 2022-03-15 LAB — TROPONIN I (HIGH SENSITIVITY)
Troponin I (High Sensitivity): 26 ng/L — ABNORMAL HIGH (ref <18)
Troponin I (High Sensitivity): 26 ng/L — ABNORMAL HIGH (ref <18)

## 2022-03-15 MED ORDER — POTASSIUM CHLORIDE CRYS ER 20 MEQ PO TBCR
40.0000 meq | EXTENDED_RELEASE_TABLET | Freq: Two times a day (BID) | ORAL | Status: AC
  Administered 2022-03-15 – 2022-03-16 (×2): 40 meq via ORAL
  Filled 2022-03-15 (×2): qty 2

## 2022-03-15 MED ORDER — SODIUM CHLORIDE 0.9 % IV BOLUS
500.0000 mL | Freq: Once | INTRAVENOUS | Status: AC
  Administered 2022-03-15: 500 mL via INTRAVENOUS

## 2022-03-15 MED ORDER — ENOXAPARIN SODIUM 40 MG/0.4ML IJ SOSY
40.0000 mg | PREFILLED_SYRINGE | INTRAMUSCULAR | Status: DC
  Administered 2022-03-15 – 2022-03-16 (×2): 40 mg via SUBCUTANEOUS
  Filled 2022-03-15 (×2): qty 0.4

## 2022-03-15 MED ORDER — ACETAMINOPHEN 650 MG RE SUPP: 650.0000 mg | Freq: Four times a day (QID) | RECTAL | Status: DC | PRN

## 2022-03-15 MED ORDER — SODIUM CHLORIDE 0.9 % IV SOLN
1.0000 g | Freq: Once | INTRAVENOUS | Status: AC
  Administered 2022-03-15: 1 g via INTRAVENOUS
  Filled 2022-03-15: qty 10

## 2022-03-15 MED ORDER — HYDROCODONE-ACETAMINOPHEN 5-325 MG PO TABS: 1.0000 | ORAL_TABLET | ORAL | Status: DC | PRN

## 2022-03-15 MED ORDER — METHYLPREDNISOLONE SODIUM SUCC 125 MG IJ SOLR
125.0000 mg | Freq: Once | INTRAMUSCULAR | Status: AC
  Administered 2022-03-15: 125 mg via INTRAVENOUS
  Filled 2022-03-15: qty 2

## 2022-03-15 MED ORDER — ONDANSETRON HCL 4 MG PO TABS: 4.0000 mg | ORAL_TABLET | Freq: Four times a day (QID) | ORAL | Status: DC | PRN

## 2022-03-15 MED ORDER — SODIUM CHLORIDE 0.9 % IV SOLN
100.0000 mg | Freq: Once | INTRAVENOUS | Status: DC
  Filled 2022-03-15: qty 100

## 2022-03-15 MED ORDER — EZETIMIBE 10 MG PO TABS
10.0000 mg | ORAL_TABLET | Freq: Every day | ORAL | Status: DC
  Administered 2022-03-16 – 2022-03-17 (×2): 10 mg via ORAL
  Filled 2022-03-15 (×2): qty 1

## 2022-03-15 MED ORDER — IPRATROPIUM BROMIDE 0.02 % IN SOLN: 0.5000 mg | Freq: Four times a day (QID) | RESPIRATORY_TRACT | Status: DC | PRN

## 2022-03-15 MED ORDER — SODIUM CHLORIDE 0.9 % IV SOLN
2.0000 g | INTRAVENOUS | Status: DC
  Administered 2022-03-16: 2 g via INTRAVENOUS
  Filled 2022-03-15: qty 20
  Filled 2022-03-15: qty 2

## 2022-03-15 MED ORDER — AMLODIPINE BESYLATE 5 MG PO TABS
5.0000 mg | ORAL_TABLET | Freq: Every day | ORAL | Status: DC
  Administered 2022-03-16 – 2022-03-17 (×2): 5 mg via ORAL
  Filled 2022-03-15 (×2): qty 1

## 2022-03-15 MED ORDER — CLOPIDOGREL BISULFATE 75 MG PO TABS
75.0000 mg | ORAL_TABLET | Freq: Every day | ORAL | Status: DC
  Administered 2022-03-16 – 2022-03-17 (×2): 75 mg via ORAL
  Filled 2022-03-15 (×2): qty 1

## 2022-03-15 MED ORDER — GUAIFENESIN ER 600 MG PO TB12
600.0000 mg | ORAL_TABLET | Freq: Two times a day (BID) | ORAL | Status: DC
  Administered 2022-03-15 – 2022-03-17 (×4): 600 mg via ORAL
  Filled 2022-03-15 (×4): qty 1

## 2022-03-15 MED ORDER — ALBUTEROL SULFATE (2.5 MG/3ML) 0.083% IN NEBU: 2.5000 mg | INHALATION_SOLUTION | Freq: Four times a day (QID) | RESPIRATORY_TRACT | Status: DC | PRN

## 2022-03-15 MED ORDER — IPRATROPIUM-ALBUTEROL 0.5-2.5 (3) MG/3ML IN SOLN
3.0000 mL | Freq: Once | RESPIRATORY_TRACT | Status: AC
  Administered 2022-03-15: 3 mL via RESPIRATORY_TRACT
  Filled 2022-03-15: qty 3

## 2022-03-15 MED ORDER — ESCITALOPRAM OXALATE 10 MG PO TABS
20.0000 mg | ORAL_TABLET | Freq: Every day | ORAL | Status: DC
  Administered 2022-03-16 – 2022-03-17 (×2): 20 mg via ORAL
  Filled 2022-03-15 (×2): qty 2

## 2022-03-15 MED ORDER — MORPHINE SULFATE (PF) 2 MG/ML IV SOLN: 1.0000 mg | Freq: Four times a day (QID) | INTRAVENOUS | Status: DC | PRN

## 2022-03-15 MED ORDER — ACETAMINOPHEN 325 MG PO TABS: 650.0000 mg | ORAL_TABLET | Freq: Four times a day (QID) | ORAL | Status: DC | PRN

## 2022-03-15 MED ORDER — ONDANSETRON HCL 4 MG/2ML IJ SOLN: 4.0000 mg | Freq: Four times a day (QID) | INTRAMUSCULAR | Status: DC | PRN

## 2022-03-15 MED ORDER — SODIUM CHLORIDE 0.9 % IV SOLN: INTRAVENOUS | Status: DC

## 2022-03-15 MED ORDER — SODIUM CHLORIDE 0.9% FLUSH
3.0000 mL | Freq: Two times a day (BID) | INTRAVENOUS | Status: DC
  Administered 2022-03-15 – 2022-03-17 (×4): 3 mL via INTRAVENOUS

## 2022-03-15 MED ORDER — SODIUM CHLORIDE 0.9 % IV SOLN
100.0000 mg | Freq: Two times a day (BID) | INTRAVENOUS | Status: DC
  Administered 2022-03-15 – 2022-03-17 (×4): 100 mg via INTRAVENOUS
  Filled 2022-03-15 (×5): qty 100

## 2022-03-15 MED ORDER — HYDRALAZINE HCL 20 MG/ML IJ SOLN: 5.0000 mg | Freq: Four times a day (QID) | INTRAMUSCULAR | Status: DC | PRN

## 2022-03-15 MED ORDER — SENNOSIDES-DOCUSATE SODIUM 8.6-50 MG PO TABS: 1.0000 | ORAL_TABLET | Freq: Every evening | ORAL | Status: DC | PRN

## 2022-03-15 MED ORDER — METHYLPREDNISOLONE SODIUM SUCC 125 MG IJ SOLR
60.0000 mg | Freq: Four times a day (QID) | INTRAMUSCULAR | Status: DC
  Administered 2022-03-16 – 2022-03-17 (×6): 60 mg via INTRAVENOUS
  Filled 2022-03-15 (×6): qty 2

## 2022-03-15 MED ORDER — BISACODYL 5 MG PO TBEC: 5.0000 mg | DELAYED_RELEASE_TABLET | Freq: Every day | ORAL | Status: DC | PRN

## 2022-03-15 MED ORDER — PANTOPRAZOLE SODIUM 40 MG PO TBEC
40.0000 mg | DELAYED_RELEASE_TABLET | Freq: Every day | ORAL | Status: DC
  Administered 2022-03-16 – 2022-03-17 (×2): 40 mg via ORAL
  Filled 2022-03-15 (×2): qty 1

## 2022-03-15 MED ORDER — TRAZODONE HCL 50 MG PO TABS
25.0000 mg | ORAL_TABLET | Freq: Every evening | ORAL | Status: DC | PRN
  Administered 2022-03-15 – 2022-03-16 (×2): 25 mg via ORAL
  Filled 2022-03-15 (×2): qty 1

## 2022-03-15 NOTE — H&P (Signed)
History and Physical   [facility] -  @ [facility] Admission History and Physical [employer], D.O.    Patient Name: Molly Matthews MR#: [id number] Date of Birth: [birth year] Date of Admission: 03/15/2022  Referring MD/NP/PA: Dr. Cherylann Banas Primary Care Physician: Sherre Scarlet, PA-C  Chief Complaint:  Chief Complaint  Patient presents with   Shortness of Breath    HPI: Molly Matthews is a 71 y.o. female with a known history of COPD, hypertension, hyperlipidemia, CAD, TIA, GERD and depression presents to the emergency department for evaluation of SOB.  Patient was in a usual state of health until the last several days when she has been feeling worsening shortness of breath.  She was seen in  clinic today and was found to be hypoxic and was referred to the emergency department.  Patient was found to be 84% in triage.. Also reports non productive cough  Patient denies fevers/chills, weakness, dizziness, chest pain, N/V/C/D, abdominal pain, dysuria/frequency, changes in mental status.    Otherwise there has been no change in status. Patient has been taking medication as prescribed and there has been no recent change in medication or diet.  No recent antibiotics.  There has been no recent illness, hospitalizations, travel or sick contacts.  Last hospitalization was in July 2023 for a CVA evaluation  EMS/ED Course: Patient received Rocephin, doxycycline, Solu-Medrol, DuoNeb and normal saline. Medical admission has been requested for further management of left sided community-acquired pneumonia and COPD exacerbation.  Review of Systems:  CONSTITUTIONAL: No fever/chills, fatigue, weakness, weight gain/loss, headache. EYES: No blurry or double vision. ENT: No tinnitus, postnasal drip, redness or soreness of the oropharynx. RESPIRATORY: Positive cough, dyspnea, wheeze.  No hemoptysis.  CARDIOVASCULAR: No chest pain, palpitations, syncope, orthopnea. No lower extremity edema.   GASTROINTESTINAL: No nausea, vomiting, abdominal pain, diarrhea, constipation.  No hematemesis, melena or hematochezia. GENITOURINARY: No dysuria, frequency, hematuria. ENDOCRINE: No polyuria or nocturia. No heat or cold intolerance. HEMATOLOGY: No anemia, bruising, bleeding. INTEGUMENTARY: No rashes, ulcers, lesions. MUSCULOSKELETAL: No arthritis, gout. NEUROLOGIC: No numbness, tingling, ataxia, seizure-type activity, weakness. PSYCHIATRIC: No anxiety, depression, insomnia.   Past Medical History:  Diagnosis Date   Anginal pain ([place])    Anxiety    Aortic atherosclerosis ([place])    Arthritis    Cancer ([place]) 01/2017   Skin cancer on right leg   Carotid artery stenosis    Carotid stenosis, left    100 % blockage   Cholelithiasis    Chronic enteritis    Chronic venous insufficiency    Colon polyps    COPD (chronic obstructive pulmonary disease) (HCC)    mild   Coronary artery disease    Depression    Diastasis recti    Diverticulosis    Diverticulosis    Dyspnea    Erosive esophagitis    Esophagitis    Fatty liver    Fatty liver disease, nonalcoholic    Gastritis    GERD (gastroesophageal reflux disease)    Headache    Herniated lumbar intervertebral disc    Herniated lumbar intervertebral disc    Hyperlipemia    Hyperlipidemia    Hypertension    IBS (irritable bowel syndrome)    Left carotid artery stenosis    Osteopenia    Osteopenia    Peptic ulcer disease    Personal history of tobacco use, presenting hazards to health 03/14/2016   SCC (squamous cell carcinoma)    Stroke ([place])    TIA's X 2   Tobacco  dependence    Vitamin D deficiency     Past Surgical History:  Procedure Laterality Date   ABDOMINAL HYSTERECTOMY     BREAST BIOPSY Left    negative 2008   CAROTID ENDARTERECTOMY     CHOLECYSTECTOMY     COLONOSCOPY W/ POLYPECTOMY     COLONOSCOPY WITH PROPOFOL N/A 07/26/2020   Procedure: COLONOSCOPY WITH PROPOFOL;  Surgeon: Toledo, Benay Pike, MD;   Location: [facility];  Service: Gastroenterology;  Laterality: N/A;   DILATION AND CURETTAGE OF UTERUS     ESOPHAGOGASTRODUODENOSCOPY (EGD) WITH PROPOFOL N/A 01/26/2017   Procedure: ESOPHAGOGASTRODUODENOSCOPY (EGD) WITH PROPOFOL;  Surgeon: Toledo, Benay Pike, MD;  Location: [facility] END[facility];  Service: Gastroenterology;  Laterality: N/A;   ESOPHAGOGASTRODUODENOSCOPY (EGD) WITH PROPOFOL N/A 07/26/2020   Procedure: ESOPHAGOGASTRODUODENOSCOPY (EGD) WITH PROPOFOL;  Surgeon: Toledo, Benay Pike, MD;  Location: [facility] END[facility];  Service: Gastroenterology;  Laterality: N/A;   HEMORRHOID SURGERY N/A 07/25/2016   Procedure: HEMORRHOIDECTOMY INTERNAL AND EXTERNAL;  Surgeon: Leonie Green, MD;  Location: [facility] ORS;  Service: General;  Laterality: N/A;   MICROLARYNGOSCOPY N/A 01/29/2019   Procedure: MICROLARYNGOSCOPY with Vocal Cord polyp removal.   No laser is needed.;  Surgeon: Clyde Canterbury, MD;  Location: [facility] ORS;  Service: ENT;  Laterality: N/A;   VASCULAR SURGERY Right    Carotid Endarterectomy      reports that she has been smoking cigarettes. She has a 45.00 pack-year smoking history. She has never used smokeless tobacco. She reports that she does not drink alcohol and does not use drugs.  Allergies  Allergen Reactions   Buspirone     Other reaction(s): Headache   Chantix [Varenicline Tartrate] Other (See Comments)    Jittery muscle movements   Statins Other (See Comments)    Muscle and joint aches - can take in small doses   Sulfa Antibiotics     Other reaction(s): Unknown   Zithromax [Azithromycin] Other (See Comments)    Back Pain   Elemental Sulfur Rash    Family History  Problem Relation Age of Onset   Breast cancer Mother 41   Atrial fibrillation Mother    Breast cancer Sister 77   Heart attack Father    Colon cancer Brother    Lung cancer Brother     Prior to Admission medications   Medication Sig Start Date End Date Taking? Authorizing Provider  acetaminophen (TYLENOL)  500 MG tablet Take 1,000 mg by mouth every 6 (six) hours as needed.    [provider]  albuterol (PROVENTIL HFA;VENTOLIN HFA) 108 (90 Base) MCG/ACT inhaler Inhale 2 puffs into the lungs every 6 (six) hours as needed for wheezing or shortness of breath.    [provider]  amLODipine (NORVASC) 5 MG tablet Take 1 tablet (5 mg total) by mouth daily. 11/17/21 12/17/21  Antonieta Pert, MD  citalopram (CELEXA) 40 MG tablet Take 40 mg by mouth at bedtime.  Patient not taking: Reported on 10/13/2021    [provider]  clopidogrel (PLAVIX) 75 MG tablet Take 75 mg by mouth daily. 03/24/19   [provider]  ergocalciferol (VITAMIN D2) 1.25 MG (50000 UT) capsule Take 50,000 Units by mouth once a week. Patient not taking: Reported on 10/13/2021    [provider]  escitalopram (LEXAPRO) 20 MG tablet Take 20 mg by mouth daily. 09/28/21   [provider]  ezetimibe (ZETIA) 10 MG tablet Take 1 tablet (10 mg total) by mouth daily. 11/17/21 12/17/21  Antonieta Pert, MD  pantoprazole ([place])  40 MG tablet Take 40 mg by mouth daily.    [provider]    Physical Exam: Vitals:   03/15/22 1648 03/15/22 1649 03/15/22 1800  BP: (!) 153/68  (!) 148/52  Pulse: 93  95  Resp: 18  (!) 25  Temp: 97.9 F (36.6 C)    TempSrc: Oral    SpO2: (!) 84%  95%  Weight:  68.9 kg   Height:  '5\' 4"'$  (1.626 m)     GENERAL: 71 y.o.-year-old white female patient, well-developed, well-nourished lying in the bed in no acute distress.  Pleasant and cooperative.   HEENT: Head atraumatic, normocephalic.  Mucus membranes moist. NECK: Supple. No JVD. CHEST: Diffuse expiratory wheezes. Wet cough, No use of accessory muscles of respiration.  No reproducible chest wall tenderness.  CARDIOVASCULAR: S1, S2 normal. No murmurs, rubs, or gallops. Cap refill <2 seconds. Pulses intact distally.  ABDOMEN: Soft, nondistended, nontender. No rebound, guarding, rigidity.  EXTREMITIES: No pedal  edema, cyanosis, or clubbing. No calf tenderness or Homan's sign.  NEUROLOGIC: The patient is alert and oriented x 3. Cranial nerves II through XII are grossly intact with no focal sensorimotor deficit. PSYCHIATRIC:  Normal affect, mood, thought content. SKIN: Warm, dry, and intact without obvious rash, lesion, or ulcer.    Labs on Admission:  CBC: Recent Labs  Lab 03/15/22 1653  WBC 9.0  NEUTROABS 5.7  HGB 13.1  HCT 38.2  MCV 93.2  PLT 948   Basic Metabolic Panel: Recent Labs  Lab 03/15/22 1653  NA 143  K 3.3*  CL 111  CO2 26  GLUCOSE 139*  BUN 11  CREATININE 0.76  CALCIUM 9.1   GFR: Estimated Creatinine Clearance: 59.7 mL/min (by C-G formula based on SCr of 0.76 mg/dL). Liver Function Tests: Recent Labs  Lab 03/15/22 1653  AST 23  ALT 21  ALKPHOS 65  BILITOT 0.7  PROT 7.4  ALBUMIN 3.9   No results for input(s): "LIPASE", "AMYLASE" in the last 168 hours. No results for input(s): "AMMONIA" in the last 168 hours. Coagulation Profile: No results for input(s): "INR", "PROTIME" in the last 168 hours. Cardiac Enzymes: No results for input(s): "CKTOTAL", "CKMB", "CKMBINDEX", "TROPONINI" in the last 168 hours. BNP (last 3 results) No results for input(s): "PROBNP" in the last 8760 hours. HbA1C: No results for input(s): "HGBA1C" in the last 72 hours. CBG: No results for input(s): "GLUCAP" in the last 168 hours. Lipid Profile: No results for input(s): "CHOL", "HDL", "LDLCALC", "TRIG", "CHOLHDL", "LDLDIRECT" in the last 72 hours. Thyroid Function Tests: No results for input(s): "TSH", "T4TOTAL", "FREET4", "T3FREE", "THYROIDAB" in the last 72 hours. Anemia Panel: No results for input(s): "VITAMINB12", "FOLATE", "FERRITIN", "TIBC", "IRON", "RETICCTPCT" in the last 72 hours. Urine analysis:    Component Value Date/Time   COLORURINE YELLOW (A) 04/25/2018 1713   APPEARANCEUR CLEAR (A) 04/25/2018 1713   APPEARANCEUR Cloudy 01/29/2014 2124   LABSPEC 1.004 (L)  04/25/2018 1713   LABSPEC 1.024 01/29/2014 2124   PHURINE 6.0 04/25/2018 1713   GLUCOSEU NEGATIVE 04/25/2018 1713   GLUCOSEU Negative 01/29/2014 2124   HGBUR NEGATIVE 04/25/2018 1713   BILIRUBINUR NEGATIVE 04/25/2018 1713   BILIRUBINUR Negative 01/29/2014 2124   KETONESUR NEGATIVE 04/25/2018 1713   PROTEINUR NEGATIVE 04/25/2018 1713   NITRITE NEGATIVE 04/25/2018 1713   LEUKOCYTESUR NEGATIVE 04/25/2018 1713   LEUKOCYTESUR 2+ 01/29/2014 2124   Sepsis Labs: '@LABRCNTIP'$ (procalcitonin:4,lacticidven:4) ) Recent Results (from the past 240 hour(s))  Resp Panel by RT-PCR (Flu A&B, Covid) Anterior Nasal Swab  Status: None   Collection Time: 03/15/22  5:24 PM   Specimen: Anterior Nasal Swab  Result Value Ref Range Status   SARS Coronavirus 2 by RT PCR NEGATIVE NEGATIVE Final    Comment: (NOTE) SARS-CoV-2 target nucleic acids are NOT DETECTED.  The SARS-CoV-2 RNA is generally detectable in upper respiratory specimens during the acute phase of infection. The lowest concentration of SARS-CoV-2 viral copies this assay can detect is 138 copies/mL. A negative result does not preclude SARS-Cov-2 infection and should not be used as the sole basis for treatment or other patient management decisions. A negative result may occur with  improper specimen collection/handling, submission of specimen other than nasopharyngeal swab, presence of viral mutation(s) within the areas targeted by this assay, and inadequate number of viral copies(<138 copies/mL). A negative result must be combined with clinical observations, patient history, and epidemiological information. The expected result is Negative.  Fact Sheet for Patients:  EntrepreneurPulse.com.au  Fact Sheet for Healthcare Providers:  IncredibleEmployment.be  This test is no t yet approved or cleared by the [place] FDA and  has been authorized for detection and/or diagnosis of SARS-CoV-2 by FDA under  an Emergency Use Authorization (EUA). This EUA will remain  in effect (meaning this test can be used) for the duration of the COVID-19 declaration under Section 564(b)(1) of the Act, 21 U.S.C.section 360bbb-3(b)(1), unless the authorization is terminated  or revoked sooner.       Influenza A by PCR NEGATIVE NEGATIVE Final   Influenza B by PCR NEGATIVE NEGATIVE Final    Comment: (NOTE) The Xpert Xpress SARS-CoV-2/FLU/RSV plus assay is intended as an aid in the diagnosis of influenza from Nasopharyngeal swab specimens and should not be used as a sole basis for treatment. Nasal washings and aspirates are unacceptable for Xpert Xpress SARS-CoV-2/FLU/RSV testing.  Fact Sheet for Patients: EntrepreneurPulse.com.au  Fact Sheet for Healthcare Providers: IncredibleEmployment.be  This test is not yet approved or cleared by the [place] FDA and has been authorized for detection and/or diagnosis of SARS-CoV-2 by FDA under an Emergency Use Authorization (EUA). This EUA will remain in effect (meaning this test can be used) for the duration of the COVID-19 declaration under Section 564(b)(1) of the Act, 21 U.S.C. section 360bbb-3(b)(1), unless the authorization is terminated or revoked.  Performed at [facility], [address] [place], [place] [address]      Radiological Exams on Admission: DG Chest [facility] 1 View  Result Date: 03/15/2022 CLINICAL DATA:  Shortness of breath and cough EXAM: PORTABLE CHEST 1 VIEW COMPARISON:  10/07/2017 FINDINGS: Diffuse bronchitic changes with more focal opacity at the lingula and left base. Normal cardiac size with aortic atherosclerosis. No pneumothorax IMPRESSION: Diffuse bronchitic changes with more focal opacity at the lingula and left base concerning for pneumonia. Radiographic follow-up to resolution is recommended Electronically Signed   By: Donavan Foil M.D.   On: 03/15/2022 17:17    EKG: Normal  sinus rhythm at 86 bpm with normal axis, LVH and nonspecific ST-T wave changes.   Assessment/Plan  This is a 71 y.o. female with a history of COPD, hypertension, hyperlipidemia, CAD, TIA, GERD and depression now being admitted with:  #. Community Acquired Pneumonia - Admit to inpatient - IV Rocephin & doxycycline per pharmacy - IV fluid hydration - Duonebs, expectorants & O2 therapy as needed - Follow up blood & sputum cultures -Respiratory therapy involved in patient's care  #. Acute exacerbation of COPD - IV steroids - Nebulizers, O2 therapy and expectorants  as needed.  - Continuous pulse oximetry - Consider pulmonary consult if not improving.   #.  Mild hypokalemia - Replace orally  #. History of hypertension - Continue Norvasc  #. History of depression - Continue Lexapro  #. History of TIA - Continue Plavix  #. History of hyperlipidemia - Continue Zetia  #. History of GERD - Continue Protonix  Admission status: Inpatient IV Fluids: Normal saline Diet/Nutrition: Heart healthy Consults called: Respiratory therapy DVT Px: Lovenox, SCDs and early ambulation. Code Status: Full Code  Disposition Plan: To home in 1-2 days  All the records are reviewed and case discussed with ED provider. Management plans discussed with the patient and/or family who express understanding and agree with plan of care.  Asli Tokarski D.O. on 03/15/2022 at 7:10 PM CC: Primary care physician; Sherre Scarlet, PA-C   03/15/2022, 7:10 PM

## 2022-03-15 NOTE — ED Provider Notes (Signed)
[facility] Provider Note    Event Date/Time   First MD Initiated Contact with Patient 03/15/22 1652     (approximate)   History   Shortness of Breath   HPI  LASHARA UREY is a 71 y.o. female with a history of COPD not on home O2, hypertension, hyperlipidemia, CAD, and carotid stenosis who presents with creased shortness of breath over the last several days associated with cough but no chest pain or fever.  The patient denies any swelling.  She states she went to the doctor today and was found to be hypoxic so sent to the ED.  I reviewed the past medical records including the hospitalist discharge summary from 11/16/2021 when the patient was last admitted.  She presented at that time with slurred speech and facial droop and was evaluated for acute stroke.   Physical Exam   Triage Vital Signs: ED Triage Vitals  Enc Vitals Group     BP 03/15/22 1648 (!) 153/68     Pulse Rate 03/15/22 1648 93     Resp 03/15/22 1648 18     Temp 03/15/22 1648 97.9 F (36.6 C)     Temp Source 03/15/22 1648 Oral     SpO2 03/15/22 1648 (!) 84 %     Weight 03/15/22 1649 151 lb 14.4 oz (68.9 kg)     Height 03/15/22 1649 '5\' 4"'$  (1.626 m)     Head Circumference --      Peak Flow --      Pain Score 03/15/22 1649 0     Pain Loc --      Pain Edu? --      Excl. in [place]? --     Most recent vital signs: Vitals:   03/15/22 1800 03/15/22 2017  BP: (!) 148/52 (!) 152/61  Pulse: 95 99  Resp: (!) 25 18  Temp:  98.3 F (36.8 C)  SpO2: 95% 91%     General: Awake, no distress.  CV:  Good peripheral perfusion.  Resp:  Slightly increased effort.  Diminished breath sounds and diffuse wheezing bilaterally. Abd:  No distention.  Other:  No peripheral edema.   ED Results / Procedures / Treatments   Labs (all labs ordered are listed, but only abnormal results are displayed) Labs Reviewed  COMPREHENSIVE METABOLIC PANEL - Abnormal; Notable for the following components:       Result Value   Potassium 3.3 (*)    Glucose, Bld 139 (*)    All other components within normal limits  BRAIN NATRIURETIC PEPTIDE - Abnormal; Notable for the following components:   B Natriuretic Peptide 108.9 (*)    All other components within normal limits  TROPONIN I (HIGH SENSITIVITY) - Abnormal; Notable for the following components:   Troponin I (High Sensitivity) 26 (*)    All other components within normal limits  TROPONIN I (HIGH SENSITIVITY) - Abnormal; Notable for the following components:   Troponin I (High Sensitivity) 26 (*)    All other components within normal limits  RESP PANEL BY RT-PCR (FLU A&B, COVID) ARPGX2  EXPECTORATED SPUTUM ASSESSMENT W GRAM STAIN, RFLX TO RESP C  RESPIRATORY PANEL BY PCR  MRSA NEXT GEN BY PCR, NASAL  CBC WITH DIFFERENTIAL/PLATELET  COMPREHENSIVE METABOLIC PANEL  CBC  LEGIONELLA PNEUMOPHILA SEROGP 1 UR AG  STREP PNEUMONIAE URINARY ANTIGEN  HIV ANTIBODY (ROUTINE TESTING W REFLEX)     EKG ED ECG REPORT I, Arta Silence, the attending physician, personally viewed and interpreted this  ECG.  Date: 03/15/2022 EKG Time: 1645 Rate: 86 Rhythm: normal sinus rhythm QRS Axis: normal Intervals: normal ST/T Wave abnormalities: LVH with nonspecific ST abnormalities inferior and lateral Narrative Interpretation: no evidence of acute ischemia; no significant change when compared to EKG of 11/15/2021    RADIOLOGY  Chest x-ray: I independently viewed and interpreted the images; there is an opacity at the left base and lingula concerning for pneumonia  PROCEDURES:  Critical Care performed: Yes, see critical care procedure note(s)  .Critical Care  Performed by: Arta Silence, MD Authorized by: Arta Silence, MD   Critical care provider statement:    Critical care time (minutes):  30   Critical care time was exclusive of:  Separately billable procedures and treating other patients   Critical care was necessary to treat or  prevent imminent or life-threatening deterioration of the following conditions:  Respiratory failure   Critical care was time spent personally by me on the following activities:  Development of treatment plan with patient or surrogate, discussions with consultants, evaluation of patient's response to treatment, examination of patient, ordering and review of laboratory studies, ordering and review of radiographic studies, ordering and performing treatments and interventions, pulse oximetry, re-evaluation of patient's condition, review of old charts and obtaining history from patient or surrogate   Care discussed with: admitting provider      MEDICATIONS ORDERED IN ED: Medications  methylPREDNISolone sodium succinate (SOLU-MEDROL) 125 mg/2 mL injection 60 mg (has no administration in time range)  amLODipine (NORVASC) tablet 5 mg (has no administration in time range)  ezetimibe (ZETIA) tablet 10 mg (has no administration in time range)  escitalopram (LEXAPRO) tablet 20 mg (has no administration in time range)  pantoprazole (PROTONIX) EC tablet 40 mg (has no administration in time range)  clopidogrel (PLAVIX) tablet 75 mg (has no administration in time range)  enoxaparin (LOVENOX) injection 40 mg (40 mg Subcutaneous Given 03/15/22 2229)  sodium chloride flush (NS) 0.9 % injection 3 mL (3 mLs Intravenous Given 03/15/22 2230)  0.9 %  sodium chloride infusion ( Intravenous New Bag/Given 03/15/22 2226)  acetaminophen (TYLENOL) tablet 650 mg (has no administration in time range)    Or  acetaminophen (TYLENOL) suppository 650 mg (has no administration in time range)  HYDROcodone-acetaminophen (NORCO/VICODIN) 5-325 MG per tablet 1-2 tablet (has no administration in time range)  morphine (PF) 2 MG/ML injection 1 mg (has no administration in time range)  traZODone (DESYREL) tablet 25 mg (25 mg Oral Given 03/15/22 2229)  senna-docusate (Senokot-S) tablet 1 tablet (has no administration in time range)   bisacodyl (DULCOLAX) EC tablet 5 mg (has no administration in time range)  ondansetron (ZOFRAN) tablet 4 mg (has no administration in time range)    Or  ondansetron (ZOFRAN) injection 4 mg (has no administration in time range)  hydrALAZINE (APRESOLINE) injection 5 mg (has no administration in time range)  cefTRIAXone (ROCEPHIN) 2 g in sodium chloride 0.9 % 100 mL IVPB (has no administration in time range)  ipratropium (ATROVENT) nebulizer solution 0.5 mg (has no administration in time range)  albuterol (PROVENTIL) (2.5 MG/3ML) 0.083% nebulizer solution 2.5 mg (has no administration in time range)  guaiFENesin (MUCINEX) 12 hr tablet 600 mg (600 mg Oral Given 03/15/22 2229)  doxycycline (VIBRAMYCIN) 100 mg in sodium chloride 0.9 % 250 mL IVPB (100 mg Intravenous New Bag/Given 03/15/22 2221)  potassium chloride SA (KLOR-CON M) CR tablet 40 mEq (40 mEq Oral Given 03/15/22 2229)  ipratropium-albuterol (DUONEB) 0.5-2.5 (3) MG/3ML nebulizer solution 3 mL (  3 mLs Nebulization Given 03/15/22 1725)  ipratropium-albuterol (DUONEB) 0.5-2.5 (3) MG/3ML nebulizer solution 3 mL (3 mLs Nebulization Given 03/15/22 1725)  sodium chloride 0.9 % bolus 500 mL (0 mLs Intravenous Stopped 03/15/22 1800)  methylPREDNISolone sodium succinate (SOLU-MEDROL) 125 mg/2 mL injection 125 mg (125 mg Intravenous Given 03/15/22 1724)  cefTRIAXone (ROCEPHIN) 1 g in sodium chloride 0.9 % 100 mL IVPB (0 g Intravenous Stopped 03/15/22 1950)     IMPRESSION / MDM / ASSESSMENT AND PLAN / ED COURSE  I reviewed the triage vital signs and the nursing notes.  71 year old female with PMH as noted above presents with increased shortness of breath and cough over the last several days.  She was found to be hypoxic to the 80s on room air.  Physical exam reveals diffuse wheezing and diminished breath sounds bilaterally but no acute respiratory distress.  Differential diagnosis includes, but is not limited to, COPD exacerbation, acute  bronchitis, pneumonia, COVID-19 or other viral syndrome, less likely cardiac etiology.  We will obtain chest x-ray, lab work-up, give bronchodilators, steroid, and reassess.  Patient's presentation is most consistent with acute presentation with potential threat to life or bodily function.  The patient is on the cardiac monitor to evaluate for evidence of arrhythmia and/or significant heart rate changes.  ----------------------------------------- 7:24 PM on 03/15/2022 -----------------------------------------  Chest x-ray shows left base and lingula opacity concerning for pneumonia.  Respiratory panel is negative.  There is no significant leukocytosis.  Troponin is minimally elevated.  Given the oxygen requirement the patient will need to be admitted.  I consulted Dr. Ara Kussmaul from the hospitalist service; based on our discussion she agrees to admit the patient.   FINAL CLINICAL IMPRESSION(S) / ED DIAGNOSES   Final diagnoses:  Community acquired pneumonia, unspecified laterality  Acute respiratory failure with hypoxia ([place])     Rx / DC Orders   ED Discharge Orders     None        Note:  This document was prepared using Dragon voice recognition software and may include unintentional dictation errors.    Arta Silence, MD 03/15/22 2257

## 2022-03-15 NOTE — ED Triage Notes (Signed)
Pt presents to ED via POV due to SOB. Pt was seen at the clinic today and reported low O2. Pt has hx COPD. Pt's O2 was 84% in triage. Pt A&Ox4

## 2022-03-16 DIAGNOSIS — J9601 Acute respiratory failure with hypoxia: Secondary | ICD-10-CM

## 2022-03-16 DIAGNOSIS — E876 Hypokalemia: Secondary | ICD-10-CM | POA: Diagnosis present

## 2022-03-16 DIAGNOSIS — J441 Chronic obstructive pulmonary disease with (acute) exacerbation: Secondary | ICD-10-CM

## 2022-03-16 LAB — RESPIRATORY PANEL BY PCR

## 2022-03-16 LAB — CBC
HCT: 39.8 % (ref 36.0–46.0)
Hemoglobin: 13.8 g/dL (ref 12.0–15.0)
MCH: 32.1 pg (ref 26.0–34.0)
MCHC: 34.7 g/dL (ref 30.0–36.0)
MCV: 92.6 fL (ref 80.0–100.0)
Platelets: 219 10*3/uL (ref 150–400)
RBC: 4.3 MIL/uL (ref 3.87–5.11)
RDW: 15.1 % (ref 11.5–15.5)
WBC: 7.1 10*3/uL (ref 4.0–10.5)
nRBC: 0 % (ref 0.0–0.2)

## 2022-03-16 LAB — COMPREHENSIVE METABOLIC PANEL
ALT: 21 U/L (ref 0–44)
AST: 21 U/L (ref 15–41)
Albumin: 4 g/dL (ref 3.5–5.0)
Alkaline Phosphatase: 65 U/L (ref 38–126)
Anion gap: 6 (ref 5–15)
BUN: 13 mg/dL (ref 8–23)
CO2: 24 mmol/L (ref 22–32)
Calcium: 9.2 mg/dL (ref 8.9–10.3)
Chloride: 115 mmol/L — ABNORMAL HIGH (ref 98–111)
Creatinine, Ser: 0.62 mg/dL (ref 0.44–1.00)
GFR, Estimated: >60 mL/min (ref >60)
Glucose, Bld: 183 mg/dL — ABNORMAL HIGH (ref 70–99)
Potassium: 4 mmol/L (ref 3.5–5.1)
Sodium: 145 mmol/L (ref 135–145)
Total Bilirubin: 0.8 mg/dL (ref 0.3–1.2)
Total Protein: 7.6 g/dL (ref 6.5–8.1)

## 2022-03-16 LAB — EXPECTORATED SPUTUM ASSESSMENT W GRAM STAIN, RFLX TO RESP C

## 2022-03-16 LAB — HIV ANTIBODY (ROUTINE TESTING W REFLEX): HIV Screen 4th Generation wRfx: NONREACTIVE

## 2022-03-16 LAB — MRSA NEXT GEN BY PCR, NASAL: MRSA by PCR Next Gen: NOT DETECTED

## 2022-03-16 MED ORDER — IPRATROPIUM BROMIDE 0.02 % IN SOLN
0.5000 mg | Freq: Four times a day (QID) | RESPIRATORY_TRACT | Status: DC
  Administered 2022-03-16: 0.5 mg via RESPIRATORY_TRACT
  Filled 2022-03-16: qty 2.5

## 2022-03-16 MED ORDER — ALBUTEROL SULFATE (2.5 MG/3ML) 0.083% IN NEBU: 2.5000 mg | INHALATION_SOLUTION | Freq: Two times a day (BID) | RESPIRATORY_TRACT | Status: DC

## 2022-03-16 MED ORDER — ALBUTEROL SULFATE (2.5 MG/3ML) 0.083% IN NEBU
2.5000 mg | INHALATION_SOLUTION | RESPIRATORY_TRACT | Status: DC | PRN
  Administered 2022-03-16: 2.5 mg via RESPIRATORY_TRACT
  Filled 2022-03-16: qty 3

## 2022-03-16 MED ORDER — IPRATROPIUM BROMIDE 0.02 % IN SOLN: 0.5000 mg | Freq: Two times a day (BID) | RESPIRATORY_TRACT | Status: DC

## 2022-03-16 MED ORDER — BUDESONIDE 0.25 MG/2ML IN SUSP
0.2500 mg | Freq: Two times a day (BID) | RESPIRATORY_TRACT | Status: DC
  Administered 2022-03-16 – 2022-03-17 (×3): 0.25 mg via RESPIRATORY_TRACT
  Filled 2022-03-16 (×3): qty 2

## 2022-03-16 MED ORDER — IPRATROPIUM-ALBUTEROL 0.5-2.5 (3) MG/3ML IN SOLN
3.0000 mL | Freq: Two times a day (BID) | RESPIRATORY_TRACT | Status: DC
  Administered 2022-03-16 – 2022-03-17 (×2): 3 mL via RESPIRATORY_TRACT
  Filled 2022-03-16 (×2): qty 3

## 2022-03-16 MED ORDER — NICOTINE 14 MG/24HR TD PT24
14.0000 mg | MEDICATED_PATCH | Freq: Every day | TRANSDERMAL | Status: DC
  Administered 2022-03-16 – 2022-03-17 (×2): 14 mg via TRANSDERMAL
  Filled 2022-03-16 (×2): qty 1

## 2022-03-16 NOTE — Assessment & Plan Note (Addendum)
Likely secondary bacterial infection superimposed on Rhinovirus infection. Treated with IV Rocephin and Doxycycline. She can complete antibiotics with 5 day course of Levaquin prescribed outpatient prior to admission. Supportive care with mucolytics etc.

## 2022-03-16 NOTE — Assessment & Plan Note (Signed)
Continue PPI ?

## 2022-03-16 NOTE — Assessment & Plan Note (Signed)
Without significant residual deficits. --Continue Plavix

## 2022-03-16 NOTE — Progress Notes (Signed)
Mobility Specialist - Progress Note   03/16/22 1130  Mobility  Activity Ambulated independently in hallway  Level of Assistance Independent  Assistive Device None  Distance Ambulated (ft) 160 ft  Activity Response Tolerated well  Mobility Referral Yes  $Mobility charge 1 Mobility   Pre-mobility: SpO2 91% During mobility: SpO2 87-88% Post-mobility: SPO2 90%  Pt supine upon entry, utilizing 1L [place]. Pt  completed bed mob, STS and amb indep. Pt ambulated one lap around NS without AD, tolerated well. Pt O2 destat to 87-88% approximately 120 ft into ambulation. Pt denied feeling lighthead or dizzy, did state feeling SOB but willing to ambulate back to the room. Pt left supine with needs within reach, O2 at 90%.   Candie Mile Mobility Specialist 03/16/22 11:37 AM

## 2022-03-16 NOTE — Assessment & Plan Note (Addendum)
Due to pneumonia and COPD exacerbation. --Supplement O2 with goal spO2 88-94% --Wean O2 as tolerated --Mgmt of CAP and COPD as outlined --Incentive spirometer --Qualifies for 2 L/min home oxygen

## 2022-03-16 NOTE — Assessment & Plan Note (Signed)
Nicotine patch ordered. Pt expresses readiness to quit. Will send Rx for nicotine patches at discharge. Close PCP follow.

## 2022-03-16 NOTE — Assessment & Plan Note (Signed)
Continue home Norvasc

## 2022-03-16 NOTE — Assessment & Plan Note (Signed)
Potassium replaced on admission. Monitor BMP, replace K PRN.
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
## 2022-03-16 NOTE — Assessment & Plan Note (Signed)
Continue home Zetia

## 2022-03-16 NOTE — Assessment & Plan Note (Signed)
Continue Lexapro

## 2022-03-17 MED ORDER — METHYLPREDNISOLONE SODIUM SUCC 125 MG IJ SOLR: 60.0000 mg | Freq: Two times a day (BID) | INTRAMUSCULAR | Status: DC

## 2022-03-17 MED ORDER — GUAIFENESIN ER 600 MG PO TB12: 600.0000 mg | ORAL_TABLET | Freq: Two times a day (BID) | ORAL | 0 refills | Status: AC

## 2022-03-17 MED ORDER — ALBUTEROL SULFATE (2.5 MG/3ML) 0.083% IN NEBU: 2.5000 mg | INHALATION_SOLUTION | Freq: Four times a day (QID) | RESPIRATORY_TRACT | 0 refills | Status: AC | PRN

## 2022-03-17 MED ORDER — NICOTINE 14 MG/24HR TD PT24: 14.0000 mg | MEDICATED_PATCH | Freq: Every day | TRANSDERMAL | 0 refills | Status: DC

## 2022-03-17 MED ORDER — PREDNISONE 10 MG PO TABS: ORAL_TABLET | ORAL | 0 refills | Status: AC

## 2022-03-17 NOTE — Progress Notes (Signed)
Mobility Specialist - Progress Note   03/17/22 1127  Orthostatic Lying   Pulse- Lying 95  Orthostatic Sitting  Pulse- Sitting 98  Orthostatic Standing at 0 minutes  Pulse- Standing at 0 minutes 100  Orthostatic Standing at 3 minutes  Pulse- Standing at 3 minutes 101  Oxygen Therapy  SpO2 97 %  O2 Device Nasal Cannula  O2 Flow Rate (L/min) 2 L/min  Mobility  Activity Ambulated independently in hallway  Level of Assistance Independent  Assistive Device None  Distance Ambulated (ft) 160 ft  Activity Response Tolerated well  Mobility Referral Yes  $Mobility charge 1 Mobility   O2 while resting on RA: 87% O2 while ambulating on RA: N/A O2 while ambulating on 2L: 91%  Pt supine upon entry utilizing 2L NS, O2 doffed for O2 sat test. Pt O2 on RA 87% while supine, MS placed Pt to 2L [place]. Pt completed bed mob, STS and amb indep. Pt ambulated one lap around NS, O2 >90%. Pt denied feeling dizzy or lightheaded, some SOB with activity. Pt left supine with needs within reach.   Candie Mile Mobility Specialist 03/17/22 11:49 AM

## 2022-03-17 NOTE — Progress Notes (Signed)
Received MD over to discharge patient to home with oxygen.  Reviewed home meds, prescriptions, and follow up appointments with patient and patient verbalized understanding
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
## 2022-03-17 NOTE — Progress Notes (Signed)
SATURATION QUALIFICATIONS: (This note is used to comply with regulatory documentation for home oxygen)  Patient Saturations on Room Air at Rest = 87%  Patient Saturations on Room Air while Ambulating = NA   Patient Saturations on 2 Liters of oxygen while Ambulating = 91%  Please briefly explain why patient needs home oxygen:

## 2022-03-17 NOTE — TOC Initial Note (Addendum)
Transition of Care [facility]) - Initial/Assessment Note    Patient Details  Name: Molly Matthews MRN: [id number] Date of Birth: Feb 21, 1951  Transition of Care [facility]) CM/SW Contact:    Magnus Ivan, LCSW Phone Number: 03/17/2022, 1:25 PM  Clinical Narrative:                 Patient to DC home today, needs o2 per DO. Spoke to patient.  Patient lives with her husband. Drives herself to appoitnemnts. PCP is Beckie Busing. Pharmacy is [place]. Patient denies [place] or DME history and says she does not feels he needs any HH or DME other than home o2.  Confirmed home address in chart with patient.  O2 referral made to [facility] with [employer], tank to be delivered to bedside today prior to DC. RN aware.   3:20- Requested update on o2 delivery.   3:48- Per Suanne Marker with [employer], they ran out of tanks but should be delivering one to patient's room shortly. Requested ETA per family request.  ETA 20 mins, RN aware.   Expected Discharge Plan: Home/Self Care Barriers to Discharge: Barriers Resolved   Patient Goals and CMS Choice Patient states their goals for this hospitalization and ongoing recovery are:: home with husband CMS Medicare.gov Compare Post Acute Care list provided to:: Patient Choice offered to / list presented to : Patient  Expected Discharge Plan and Services Expected Discharge Plan: Home/Self Care       Living arrangements for the past 2 months: Single Family Home Expected Discharge Date: 03/17/22               DME Arranged: Oxygen DME Agency: [employer] Date DME Agency Contacted: 03/17/22   Representative spoke with at DME Agency: Suanne Marker            Prior Living Arrangements/Services Living arrangements for the past 2 months: [place] Lives with:: Spouse Patient language and need for interpreter reviewed:: Yes Do you feel safe going back to the place where you live?: Yes      Need for Family Participation in Patient Care: Yes (Comment) Care  giver support system in place?: Yes (comment)   Criminal Activity/Legal Involvement Pertinent to Current Situation/Hospitalization: No - Comment as needed  Activities of Daily Living Home Assistive Devices/Equipment: None ADL Screening (condition at time of admission) Patient's cognitive ability adequate to safely complete daily activities?: Yes Is the patient deaf or have difficulty hearing?: Yes Does the patient have difficulty seeing, even when wearing glasses/contacts?: No Does the patient have difficulty concentrating, remembering, or making decisions?: No Patient able to express need for assistance with ADLs?: Yes Does the patient have difficulty dressing or bathing?: No Independently performs ADLs?: Yes (appropriate for developmental age) Does the patient have difficulty walking or climbing stairs?: No Weakness of Legs: None Weakness of Arms/Hands: None  Permission Sought/Granted Permission sought to share information with : Chartered certified accountant granted to share information with : Yes, Verbal Permission Granted     Permission granted to share info w AGENCY: [employer] [facility]        Emotional Assessment       Orientation: : Oriented to Self, Oriented to Place, Oriented to  Time, Oriented to Situation Alcohol / Substance Use: Not Applicable Psych Involvement: No (comment)  Admission diagnosis:  CAP (community acquired pneumonia) [J18.9] Community acquired pneumonia, unspecified laterality [J18.9] Patient Active Problem List   Diagnosis Date Noted   Acute respiratory failure with hypoxia ([place]) 03/16/2022   Hypokalemia  03/16/2022   CAP (community acquired pneumonia) 03/15/2022   Stroke ([place]) 11/15/2021   COPD with acute exacerbation ([place])    Coronary artery disease    Tobacco dependence due to cigarettes    PUD (peptic ulcer disease)    Acute ischemic stroke [facility])    Atherosclerosis of native arteries of extremity with intermittent claudication ([place])  12/07/2020   Nausea and vomiting 09/30/2020   Stable angina pectoris 06/15/2020   SOBOE (shortness of breath on exertion) 01/28/2020   Chronic venous insufficiency 09/04/2019   Hoarseness 01/19/2019   Thoracic aortic aneurysm without rupture ([place]) 04/19/2018   Vitamin D insufficiency 04/19/2018   Recurrent major depression in partial remission ([place]) 06/29/2017   SCC (squamous cell carcinoma) 03/20/2017   Skin lesion 03/13/2017   Precordial pain 01/04/2017   Current use of long term anticoagulation 09/14/2016   Carotid stenosis 08/24/2016   Hypertension 08/24/2016   Hyperlipidemia 08/24/2016   Fatigue 07/17/2016   Headache disorder 07/17/2016   Occlusion of left carotid artery 06/22/2016   Personal history of tobacco use, presenting hazards to health 03/14/2016   Presbyesophagus 11/24/2015   Diastasis recti 11/11/2014   Herniated lumbar intervertebral disc 11/11/2014   Candidal vaginitis 11/09/2014   Epigastric pain 09/22/2014   Viral URI with cough 06/03/2014   Depression 05/04/2014   Personal history of transient ischemic attack (TIA), and cerebral infarction without residual deficits 03/05/2014   Pruritus 03/05/2014   Hiatal hernia with gastroesophageal reflux 01/15/2014   Cholelithiasis 12/16/2013   Fatty (change of) liver, not elsewhere classified 12/16/2013   Cerebrovascular accident (CVA) ([place]) 12/03/2013   Nicotine dependence, uncomplicated 11/91/4782   Hemorrhoids 11/18/2013   Pain of left sacroiliac joint 08/21/2013   Allergic rhinitis 06/30/2013   Chronic low back pain 12/23/2012   HNP (herniated nucleus pulposus), lumbar 12/23/2012   Osteopenia 09/18/2012   PCP:  Sherre Scarlet, PA-C Pharmacy:   [facility] [address] [place] [address] [place] [address] [place] [address] Phone: [phone] Fax: [facility] [phone] [address], [place] [address] Phone: [phone] Fax: [phone]     Social Determinants of Health (SDOH) Interventions    Readmission Risk Interventions    03/17/2022    1:24 PM  Readmission Risk Prevention Plan  Post Dischage Appt Complete  Medication Screening Complete  Transportation Screening Complete

## 2022-03-17 NOTE — Care Management Important Message (Signed)
Important Message  Patient Details  Name: BEZA STEPPE MRN: [id number] Date of Birth: February 20, 1951   Medicare Important Message Given:  N/A - LOS <3 / Initial given by admissions     Juliann Pulse A Sitlaly Gudiel 03/17/2022, 8:16 AM

## 2022-03-18 LAB — CULTURE, RESPIRATORY W GRAM STAIN: Culture: NORMAL

## 2022-04-11 ENCOUNTER — Ambulatory Visit
Admission: RE | Admit: 2022-04-11 | Discharge: 2022-04-11 | Disposition: A | Discharge status: Home / Self Care | Payer: Medicare Other | Source: Ambulatory Visit | Attending: Physician Assistant | Admitting: Physician Assistant

## 2022-04-11 DIAGNOSIS — Z1231 Encounter for screening mammogram for malignant neoplasm of breast: Secondary | ICD-10-CM | POA: Diagnosis not present

## 2022-04-27 ENCOUNTER — Ambulatory Visit
Admission: RE | Admit: 2022-04-27 | Discharge: 2022-04-27 | Disposition: A | Discharge status: Home / Self Care | Payer: Medicare Other | Source: Ambulatory Visit | Attending: Acute Care | Admitting: Acute Care

## 2022-04-27 DIAGNOSIS — F17210 Nicotine dependence, cigarettes, uncomplicated: Secondary | ICD-10-CM | POA: Diagnosis present

## 2022-04-27 DIAGNOSIS — Z87891 Personal history of nicotine dependence: Secondary | ICD-10-CM | POA: Insufficient documentation

## 2022-05-02 ENCOUNTER — Telehealth: Payer: Self-pay | Admitting: Acute Care

## 2022-05-02 DIAGNOSIS — R911 Solitary pulmonary nodule: Secondary | ICD-10-CM

## 2022-05-02 DIAGNOSIS — Z87891 Personal history of nicotine dependence: Secondary | ICD-10-CM

## 2022-05-02 NOTE — Telephone Encounter (Signed)
IMPRESSION: 1. Lung-RADS 3, probably benign findings. Short-term follow-up in 6 months is recommended with repeat low-dose chest CT without contrast (please use the following order, "CT CHEST LCS NODULE FOLLOW-UP W/O CM"). New clustered indistinct apical right upper lobe pulmonary nodules, largest 4.4 mm in volume derived mean diameter, potentially inflammatory. 2. New mild bandlike consolidation in the lingula and anteromedial right lower lobe with associated volume loss, favoring evolving postinfectious scarring versus atelectasis. 3. Three-vessel coronary atherosclerosis. 4. Aortic Atherosclerosis (ICD10-I70.0) and Emphysema (ICD10-J43.9).  Called pt and discussed CT results. Pt was recently treated for pneumonia several weeks ago. I explained that the areas seen on this scan could be from the recent infection and that the radiologist has recommended to repeat CT in 6 months to revaluate the areas seen. Pt verbalized understanding. Pt is also being followed by Dr Raul Del. I will send a copy of this report to him with follow up plans included. Order placed for 6 mth nodule LCS CT.

## 2022-05-02 NOTE — Telephone Encounter (Signed)
Corrected noted that was incorrectly placed in patients chart. Took note out and told Judson Roch that I did mess up and that I did correct the chart for the patient.

## 2022-05-02 NOTE — Telephone Encounter (Signed)
Message sent to Eric Form NP to follow up on through Cancer screening program.  IMPRESSION: 1. Lung-RADS 3, probably benign findings. Short-term follow-up in 6 months is recommended with repeat low-dose chest CT without contrast (please use the following order, "CT CHEST LCS NODULE FOLLOW-UP W/O CM"). New clustered indistinct apical right upper lobe pulmonary nodules, largest 4.4 mm in volume derived mean diameter, potentially inflammatory. 2. New mild bandlike consolidation in the lingula and anteromedial right lower lobe with associated volume loss, favoring evolving postinfectious scarring versus atelectasis.

## 2022-09-22 ENCOUNTER — Other Ambulatory Visit: Payer: Self-pay | Admitting: Internal Medicine

## 2022-09-22 DIAGNOSIS — R0602 Shortness of breath: Secondary | ICD-10-CM

## 2022-09-22 DIAGNOSIS — I2510 Atherosclerotic heart disease of native coronary artery without angina pectoris: Secondary | ICD-10-CM

## 2022-10-05 ENCOUNTER — Other Ambulatory Visit (INDEPENDENT_AMBULATORY_CARE_PROVIDER_SITE_OTHER): Payer: Self-pay | Admitting: Vascular Surgery

## 2022-10-05 DIAGNOSIS — I6523 Occlusion and stenosis of bilateral carotid arteries: Secondary | ICD-10-CM

## 2022-10-16 ENCOUNTER — Encounter (INDEPENDENT_AMBULATORY_CARE_PROVIDER_SITE_OTHER): Payer: Self-pay | Admitting: Vascular Surgery

## 2022-10-16 ENCOUNTER — Ambulatory Visit (INDEPENDENT_AMBULATORY_CARE_PROVIDER_SITE_OTHER): Payer: Medicare HMO | Admitting: Vascular Surgery

## 2022-10-16 ENCOUNTER — Ambulatory Visit (INDEPENDENT_AMBULATORY_CARE_PROVIDER_SITE_OTHER): Payer: Medicare HMO

## 2022-10-16 VITALS — BP 138/70 | HR 79 | Resp 16 | Wt 151.6 lb

## 2022-10-16 DIAGNOSIS — I6523 Occlusion and stenosis of bilateral carotid arteries: Secondary | ICD-10-CM

## 2022-10-16 DIAGNOSIS — J449 Chronic obstructive pulmonary disease, unspecified: Secondary | ICD-10-CM

## 2022-10-16 DIAGNOSIS — I25119 Atherosclerotic heart disease of native coronary artery with unspecified angina pectoris: Secondary | ICD-10-CM

## 2022-10-16 DIAGNOSIS — M79669 Pain in unspecified lower leg: Secondary | ICD-10-CM

## 2022-10-16 DIAGNOSIS — I70211 Atherosclerosis of native arteries of extremities with intermittent claudication, right leg: Secondary | ICD-10-CM | POA: Diagnosis not present

## 2022-10-16 DIAGNOSIS — M7989 Other specified soft tissue disorders: Secondary | ICD-10-CM

## 2022-10-16 DIAGNOSIS — I7123 Aneurysm of the descending thoracic aorta, without rupture: Secondary | ICD-10-CM

## 2022-10-16 NOTE — Progress Notes (Signed)
MRN : [id number]  Molly Matthews is a 72 y.o. (22-Apr-1951) female who presents with chief complaint of check carotid arteries.  History of Present Illness:   The patient is seen for follow up evaluation of carotid stenosis. The carotid stenosis followed by ultrasound.    The patient denies amaurosis fugax. There is no recent history of TIA symptoms or focal motor deficits. There is no prior documented CVA.   The patient is taking enteric-coated aspirin 81 mg daily.   There is no history of migraine headaches. There is no history of seizures.   The patient is also here for followup and review of noninvasive studies of the lower extremities. There have been no interval changes in lower extremity symptoms. No interval shortening of the patient's claudication distance or development of rest pain symptoms. However, she does have occasional episodes of numbness in her right foot which gets better when she stands.  No new ulcers or wounds have occurred since the last visit.   There has been a significant changes to the patient's overall health care.  At today's appointment she is also complaining of a significant increase in swelling as well as leg pain associated with the swelling.  Patient is seen for evaluation of leg pain and leg swelling. The patient first noticed the swelling remotely. The swelling is associated with pain and discoloration. The pain and swelling worsens with prolonged dependency and improves with elevation. The pain is unrelated to activity.  The patient notes that in the morning the legs are significantly improved but they steadily worsened throughout the course of the day. The patient also notes a steady worsening of the discoloration in the ankle and shin area.   The patient denies claudication symptoms.  The patient denies symptoms consistent with rest pain.  The patient denies and extensive history of DJD and LS spine disease.  The patient has no  had any past angiography, interventions or vascular surgery.  Elevation makes the leg symptoms better, dependency makes them much worse. There is no history of ulcerations. The patient denies any recent changes in medications.  The patient has not been wearing graduated compression.  The patient denies a history of DVT or PE. There is no prior history of phlebitis. There is no history of primary lymphedema.  No history of malignancies. No history of trauma or groin or pelvic surgery. There is no history of radiation treatment to the groin or pelvis  The patient has a history of coronary artery disease, no recent episodes of angina or shortness of breath. There is a history of hyperlipidemia which is being treated with a statin.     Carotid Duplex done today shows RICA 40-59% (s/p right CEA) and known occlusion of the left ICA.  No significant change compared to last study in 10/13/2021   Previous ABI's Rt=1.00 and Lt=1.11 (previous ABI Rt=0.98 and Lt=1.15).   CT scanning of the chest for lung cancer screening dated 05/01/2022 is reviewed by me demonstrates a stable saccular descending thoracic aortic aneurysm measuring 3.8 cm in maximal dimension  Current Meds  Medication Sig   acetaminophen (TYLENOL) 500 MG tablet Take 1,000 mg by mouth every 6 (six) hours as needed.   albuterol (PROVENTIL HFA;VENTOLIN HFA) 108 (90 Base) MCG/ACT inhaler Inhale 2 puffs into the lungs every 6 (six) hours as needed for wheezing or shortness  of breath.   albuterol (PROVENTIL) (2.5 MG/3ML) 0.083% nebulizer solution Take 3 mLs (2.5 mg total) by nebulization every 6 (six) hours as needed for wheezing or shortness of breath.   amLODipine (NORVASC) 5 MG tablet Take 1 tablet (5 mg total) by mouth daily.   aspirin EC 81 MG tablet Take 81 mg by mouth daily. Swallow whole.   clopidogrel (PLAVIX) 75 MG tablet Take 75 mg by mouth daily.   escitalopram (LEXAPRO) 20 MG tablet Take 20 mg by mouth daily.   ezetimibe  (ZETIA) 10 MG tablet Take 1 tablet (10 mg total) by mouth daily.   gabapentin (NEURONTIN) 100 MG capsule Take 1 capsule by mouth at bedtime.   nicotine (NICODERM CQ - DOSED IN MG/24 HOURS) 14 mg/24hr patch Place 1 patch (14 mg total) onto the skin daily.   pantoprazole (PROTONIX) 40 MG tablet Take 40 mg by mouth daily.   REPATHA SURECLICK 140 MG/ML SOAJ Inject 1 mL into the skin every 14 (fourteen) days.   TRELEGY ELLIPTA 100-62.5-25 MCG/ACT AEPB Inhale 1 puff into the lungs daily.    Past Medical History:  Diagnosis Date   Anginal pain (HCC)    Anxiety    Aortic atherosclerosis (HCC)    Arthritis    Cancer (HCC) 01/2017   Skin cancer on right leg   Carotid artery stenosis    Carotid stenosis, left    100 % blockage   Cholelithiasis    Chronic enteritis    Chronic venous insufficiency    Colon polyps    COPD (chronic obstructive pulmonary disease) (HCC)    mild   Coronary artery disease    Depression    Diastasis recti    Diverticulosis    Diverticulosis    Dyspnea    Erosive esophagitis    Esophagitis    Fatty liver    Fatty liver disease, nonalcoholic    Gastritis    GERD (gastroesophageal reflux disease)    Headache    Herniated lumbar intervertebral disc    Herniated lumbar intervertebral disc    Hyperlipemia    Hyperlipidemia    Hypertension    IBS (irritable bowel syndrome)    Left carotid artery stenosis    Osteopenia    Osteopenia    Peptic ulcer disease    Personal history of tobacco use, presenting hazards to health 03/14/2016   SCC (squamous cell carcinoma)    Stroke (HCC)    TIA's X 2   Tobacco dependence    Vitamin D deficiency     Past Surgical History:  Procedure Laterality Date   ABDOMINAL HYSTERECTOMY     BREAST BIOPSY Left    negative 2008   CAROTID ENDARTERECTOMY     CHOLECYSTECTOMY     COLONOSCOPY W/ POLYPECTOMY     COLONOSCOPY WITH PROPOFOL N/A 07/26/2020   Procedure: COLONOSCOPY WITH PROPOFOL;  Surgeon: Toledo, Boykin Nearing, MD;   Location: [facility] END[facility];  Service: Gastroenterology;  Laterality: N/A;   DILATION AND CURETTAGE OF UTERUS     ESOPHAGOGASTRODUODENOSCOPY (EGD) WITH PROPOFOL N/A 01/26/2017   Procedure: ESOPHAGOGASTRODUODENOSCOPY (EGD) WITH PROPOFOL;  Surgeon: Toledo, Boykin Nearing, MD;  Location: [facility] END[facility]COPY;  Service: Gastroenterology;  Laterality: N/A;   ESOPHAGOGASTRODUODENOSCOPY (EGD) WITH PROPOFOL N/A 07/26/2020   Procedure: ESOPHAGOGASTRODUODENOSCOPY (EGD) WITH PROPOFOL;  Surgeon: Toledo, Boykin Nearing, MD;  Location: [facility] ENDOSCOPY;  Service: Gastroenterology;  Laterality: N/A;   HEMORRHOID SURGERY N/A 07/25/2016   Procedure: HEMORRHOIDECTOMY INTERNAL AND EXTERNAL;  Surgeon: Nadeen Landau, MD;  Location: [facility]  ORS;  Service: General;  Laterality: N/A;   MICROLARYNGOSCOPY N/A 01/29/2019   Procedure: MICROLARYNGOSCOPY with Vocal Cord polyp removal.   No laser is needed.;  Surgeon: Geanie Logan, MD;  Location: [facility] ORS;  Service: ENT;  Laterality: N/A;   VASCULAR SURGERY Right    Carotid Endarterectomy     Social History Social History   Tobacco Use   Smoking status: Every Day    Packs/day: 1.00    Years: 45.00    Additional pack years: 0.00    Total pack years: 45.00    Types: Cigarettes   Smokeless tobacco: Never  Vaping Use   Vaping Use: Never used  Substance Use Topics   Alcohol use: No   Drug use: No    Family History Family History  Problem Relation Age of Onset   Breast cancer Mother 71   Atrial fibrillation Mother    Breast cancer Sister 55   Heart attack Father    Colon cancer Brother    Lung cancer Brother     Allergies  Allergen Reactions   Buspirone     Other reaction(s): Headache   Chantix [Varenicline Tartrate] Other (See Comments)    Jittery muscle movements   Statins Other (See Comments)    Muscle and joint aches - can take in small doses   Sulfa Antibiotics     Other reaction(s): Unknown   Zithromax [Azithromycin] Other (See Comments)    Back Pain   Elemental  Sulfur Rash     REVIEW OF SYSTEMS (Negative unless checked)  Constitutional: [] Weight loss  [] Fever  [] Chills Cardiac: [] Chest pain   [] Chest pressure   [] Palpitations   [] Shortness of breath when laying flat   [] Shortness of breath with exertion. Vascular:  [x] Pain in legs with walking   [] Pain in legs at rest  [] History of DVT   [] Phlebitis   [] Swelling in legs   [] Varicose veins   [] Non-healing ulcers Pulmonary:   [] Uses home oxygen   [] Productive cough   [] Hemoptysis   [] Wheeze  [] COPD   [] Asthma Neurologic:  [] Dizziness   [] Seizures   [] History of stroke   [] History of TIA  [] Aphasia   [] Vissual changes   [] Weakness or numbness in arm   [] Weakness or numbness in leg Musculoskeletal:   [] Joint swelling   [] Joint pain   [] Low back pain Hematologic:  [] Easy bruising  [] Easy bleeding   [] Hypercoagulable state   [] Anemic Gastrointestinal:  [] Diarrhea   [] Vomiting  [] Gastroesophageal reflux/heartburn   [] Difficulty swallowing. Genitourinary:  [] Chronic kidney disease   [] Difficult urination  [] Frequent urination   [] Blood in urine Skin:  [] Rashes   [] Ulcers  Psychological:  [] History of anxiety   []  History of major depression.  Physical Examination  Vitals:   10/16/22 0958  BP: 138/70  Pulse: 79  Resp: 16  Weight: 151 lb 9.6 oz (68.8 kg)   Body mass index is 26.02 kg/m. Gen: WD/WN, NAD Head: [place]/AT, No temporalis wasting.  Ear/Nose/Throat: Hearing grossly intact, nares w/o erythema or drainage Eyes: PER, EOMI, sclera nonicteric.  Neck: Supple, no masses.  No bruit or JVD.  Pulmonary:  Good air movement, no audible wheezing, no use of accessory muscles.  Cardiac: RRR, normal S1, S2, no Murmurs. Vascular:  carotid bruit noted Vessel Right Left  Radial Palpable Palpable  Carotid  Palpable  Palpable  Subclav  Palpable Palpable  Gastrointestinal: soft, non-distended. No guarding/no peritoneal signs.  Musculoskeletal: M/S 5/5 throughout.  No visible deformity.  Neurologic: CN 2-12  intact. Pain and light touch intact in extremities.  Symmetrical.  Speech is fluent. Motor exam as listed above. Psychiatric: Judgment intact, Mood & affect appropriate for pt's clinical situation. Dermatologic: No rashes or ulcers noted.  No changes consistent with cellulitis.   CBC Lab Results  Component Value Date   WBC 7.1 03/16/2022   HGB 13.8 03/16/2022   HCT 39.8 03/16/2022   MCV 92.6 03/16/2022   PLT 219 03/16/2022    BMET    Component Value Date/Time   NA 145 03/16/2022 0448   NA 142 01/29/2014 2124   K 4.0 03/16/2022 0448   K 4.0 01/29/2014 2124   CL 115 (H) 03/16/2022 0448   CL 110 (H) 01/29/2014 2124   CO2 24 03/16/2022 0448   CO2 25 01/29/2014 2124   GLUCOSE 183 (H) 03/16/2022 0448   GLUCOSE 137 (H) 01/29/2014 2124   BUN 13 03/16/2022 0448   BUN 10 01/29/2014 2124   CREATININE 0.62 03/16/2022 0448   CREATININE 1.00 01/29/2014 2124   CALCIUM 9.2 03/16/2022 0448   CALCIUM 9.3 01/29/2014 2124   GFRNONAA >60 03/16/2022 0448   GFRNONAA 59 (L) 01/29/2014 2124   GFRNONAA >60 12/22/2013 1159   GFRAA >60 04/25/2018 1713   GFRAA >60 01/29/2014 2124   GFRAA >60 12/22/2013 1159   CrCl cannot be calculated (Patient's most recent lab result is older than the maximum 21 days allowed.).  COAG Lab Results  Component Value Date   INR 1.0 11/15/2021   INR 1.1 12/31/2020    Radiology No results found.   Assessment/Plan 1. Bilateral carotid artery stenosis Recommend:   Given the patient's asymptomatic subcritical stenosis no further invasive testing or surgery at this time.   Carotid Duplex done today shows RICA 40-59% (s/p right CEA) and known occlusion of the left ICA.  No significant change compared to last study in 10/13/2021   Continue antiplatelet therapy as prescribed Continue management of CAD, HTN and Hyperlipidemia Healthy heart diet,  encouraged exercise at least 4 times per week Follow up in 12 months with duplex ultrasound and physical exam   -  VAS US CAROTID; Future  2. Pain and swelling of lower leg, unspecified laterality Recommend:  I have had a long discussion with the patient regarding swelling and why it  causes symptoms.  Patient will begin wearing graduated compression on a daily basis a prescription was given. The patient will  wear the stockings first thing in the morning and removing them in the evening. The patient is instructed specifically not to sleep in the stockings.   In addition, behavioral modification will be initiated.  This will include frequent elevation, use of over the counter pain medications and exercise such as walking.  Consideration for a lymph pump will also be made based upon the effectiveness of conservative therapy.  This would help to improve the edema control and prevent sequela such as ulcers and infections   Patient should undergo duplex ultrasound of the venous system to ensure that DVT or reflux is not present.  The patient will follow-up with me after the ultrasound.  - VAS [place] LOWER EXTREMITY VENOUS (DVT); Future  3. Atherosclerosis of native artery of right lower extremity with intermittent claudication (HCC)  Recommend:  The patient has evidence of atherosclerosis of the lower extremities with claudication.  The patient does not voice lifestyle limiting changes at this point in time.  Noninvasive studies do not suggest clinically significant change.  No invasive studies, angiography or surgery at  this time The patient should continue walking and begin a more formal exercise program.  The patient should continue antiplatelet therapy and aggressive treatment of the lipid abnormalities  No changes in the patient's medications at this time  Continued surveillance is indicated as atherosclerosis is likely to progress with time.    The patient will continue follow up with noninvasive studies as ordered.   4. Aneurysm of descending thoracic aorta without rupture (HCC) Recommend:  No  surgery or intervention is indicated at this time.  The patient has an asymptomatic thoracic aortic aneurysm that is less than 6.0 cm in maximal diameter.  I have discussed the natural history of thoracic aortic aneurysm and the small risk of rupture for aneurysm less than 6.5 cm in size.  However, as these small aneurysms tend to enlarge over time, continued surveillance with CT scan is mandatory.   I have also discussed optimizing medical management with hypertension and lipid control and the importance of abstinence from tobacco.  The patient is also encouraged to exercise a minimum of 30 minutes 4 times a week.   Should the patient develop new onset chest or back pain or signs of peripheral embolization they are instructed to seek medical attention immediately and to alert the physician providing care that they have an aneurysm in the chest.   The patient voices their understanding.  The patient will return as ordered with a CT scan of the chest  5. Coronary artery disease involving native coronary artery of native heart with angina pectoris (HCC) Continue cardiac and antihypertensive medications as already ordered and reviewed, no changes at this time.  Continue statin as ordered and reviewed, no changes at this time  Nitrates PRN for chest pain   6. Chronic obstructive pulmonary disease, unspecified COPD type (HCC) Continue pulmonary medications and aerosols as already ordered, these medications have been reviewed and there are no changes at this time.     Levora Dredge, MD  10/16/2022 10:23 AM

## 2022-10-21 ENCOUNTER — Encounter (INDEPENDENT_AMBULATORY_CARE_PROVIDER_SITE_OTHER): Payer: Self-pay | Admitting: Vascular Surgery

## 2022-10-21 DIAGNOSIS — M79669 Pain in unspecified lower leg: Secondary | ICD-10-CM | POA: Insufficient documentation

## 2022-10-21 DIAGNOSIS — M7989 Other specified soft tissue disorders: Secondary | ICD-10-CM | POA: Insufficient documentation

## 2022-10-24 ENCOUNTER — Other Ambulatory Visit (HOSPITAL_COMMUNITY): Payer: Self-pay | Admitting: Emergency Medicine

## 2022-10-24 ENCOUNTER — Encounter (HOSPITAL_COMMUNITY): Payer: Self-pay

## 2022-10-24 DIAGNOSIS — R079 Chest pain, unspecified: Secondary | ICD-10-CM

## 2022-10-24 MED ORDER — IVABRADINE HCL 5 MG PO TABS: 15.0000 mg | ORAL_TABLET | Freq: Once | ORAL | 0 refills | Status: AC

## 2022-10-24 MED ORDER — METOPROLOL TARTRATE 100 MG PO TABS: 100.0000 mg | ORAL_TABLET | Freq: Once | ORAL | 0 refills | Status: DC

## 2022-10-25 ENCOUNTER — Telehealth (HOSPITAL_COMMUNITY): Payer: Self-pay | Admitting: Emergency Medicine

## 2022-10-25 NOTE — Telephone Encounter (Signed)
Reaching out to patient to offer assistance regarding upcoming cardiac imaging study; pt verbalizes understanding of appt date/time, parking situation and where to check in, pre-test NPO status and medications ordered, and verified current allergies; name and call back number provided for further questions should they arise Myrissa Chipley RN Navigator Cardiac Imaging [facility] [phone] office [phone] cell 

## 2022-10-26 ENCOUNTER — Other Ambulatory Visit: Payer: Self-pay | Admitting: Internal Medicine

## 2022-10-26 ENCOUNTER — Ambulatory Visit
Admission: RE | Admit: 2022-10-26 | Discharge: 2022-10-26 | Disposition: A | Discharge status: Home / Self Care | Payer: Medicare HMO | Source: Ambulatory Visit | Attending: Internal Medicine | Admitting: Internal Medicine

## 2022-10-26 DIAGNOSIS — I2510 Atherosclerotic heart disease of native coronary artery without angina pectoris: Secondary | ICD-10-CM | POA: Diagnosis present

## 2022-10-26 DIAGNOSIS — Z87891 Personal history of nicotine dependence: Secondary | ICD-10-CM | POA: Insufficient documentation

## 2022-10-26 DIAGNOSIS — R0602 Shortness of breath: Secondary | ICD-10-CM | POA: Diagnosis present

## 2022-10-26 DIAGNOSIS — R911 Solitary pulmonary nodule: Secondary | ICD-10-CM | POA: Insufficient documentation

## 2022-10-26 DIAGNOSIS — R931 Abnormal findings on diagnostic imaging of heart and coronary circulation: Secondary | ICD-10-CM | POA: Diagnosis present

## 2022-10-26 MED ORDER — NITROGLYCERIN 0.4 MG SL SUBL
0.8000 mg | SUBLINGUAL_TABLET | Freq: Once | SUBLINGUAL | Status: AC
  Administered 2022-10-26: 0.8 mg via SUBLINGUAL

## 2022-10-26 MED ORDER — SODIUM CHLORIDE 0.9 % IV BOLUS
250.0000 mL | Freq: Once | INTRAVENOUS | Status: AC
  Administered 2022-10-26: 250 mL via INTRAVENOUS

## 2022-10-26 MED ORDER — IOHEXOL 350 MG/ML SOLN
75.0000 mL | Freq: Once | INTRAVENOUS | Status: AC | PRN
  Administered 2022-10-26: 75 mL via INTRAVENOUS

## 2022-10-26 NOTE — Progress Notes (Signed)
Patient tolerated procedure well. Ambulate w/o difficulty. Denies any lightheadedness or being dizzy. Pt denies any pain at this time. Sitting in chair, drinking water provided. P is encouraged to drink additional water throughout the day and reason explained to patient. Patient verbalized understanding and all questions answered. ABC intact. No further needs at this time. Discharge from procedure area w/o issues.  °

## 2022-10-30 ENCOUNTER — Ambulatory Visit (INDEPENDENT_AMBULATORY_CARE_PROVIDER_SITE_OTHER): Payer: Medicare HMO | Admitting: Vascular Surgery

## 2022-10-30 ENCOUNTER — Encounter (INDEPENDENT_AMBULATORY_CARE_PROVIDER_SITE_OTHER): Payer: Medicare HMO

## 2022-10-31 ENCOUNTER — Other Ambulatory Visit: Payer: Self-pay | Admitting: Acute Care

## 2022-10-31 DIAGNOSIS — Z122 Encounter for screening for malignant neoplasm of respiratory organs: Secondary | ICD-10-CM

## 2022-10-31 DIAGNOSIS — Z87891 Personal history of nicotine dependence: Secondary | ICD-10-CM

## 2022-10-31 DIAGNOSIS — F17210 Nicotine dependence, cigarettes, uncomplicated: Secondary | ICD-10-CM

## 2023-02-05 NOTE — Progress Notes (Signed)
Patient ID: Molly Matthews, female   DOB: 08-22-1950, 72 y.o.   MRN: [id number]  Chief Complaint: Hemorrhoids  History of Present Illness Molly Matthews is a 72 y.o. female with pain with bowel movements.  Reports the pain feels like knives, has to strain on a daily basis. Shows traces of blood with wiping.  Feels like incompletely emptying.  Uses MiraLAX occasionally as needed.  She had a colonoscopy in 2022.  Has bowel movements 2-3 times per day, often following lunch.  Past Medical History Past Medical History:  Diagnosis Date   Anginal pain (HCC)    Anxiety    Aortic atherosclerosis (HCC)    Arthritis    Cancer (HCC) 01/2017   Skin cancer on right leg   Carotid stenosis, left    100 % blockage   Cholelithiasis    Chronic enteritis    Chronic venous insufficiency    Colon polyps    COPD (chronic obstructive pulmonary disease) (HCC)    mild   Coronary artery disease    Depression    Diastasis recti    Diverticulosis    Dyspnea    Erosive esophagitis    Fatty liver disease, nonalcoholic    Gastritis    GERD (gastroesophageal reflux disease)    Headache    Herniated lumbar intervertebral disc    Hyperlipidemia    Hypertension    IBS (irritable bowel syndrome)    Left carotid artery stenosis    Osteopenia    Peptic ulcer disease    Personal history of tobacco use, presenting hazards to health 03/14/2016   SCC (squamous cell carcinoma)    Stroke (HCC)    TIA's X 2   Tobacco dependence    Vitamin D deficiency       Past Surgical History:  Procedure Laterality Date   ABDOMINAL HYSTERECTOMY     BREAST BIOPSY Left    negative 2008   CAROTID ENDARTERECTOMY     CHOLECYSTECTOMY     COLONOSCOPY W/ POLYPECTOMY     COLONOSCOPY WITH PROPOFOL N/A 07/26/2020   Procedure: COLONOSCOPY WITH PROPOFOL;  Surgeon: Toledo, Boykin Nearing, MD;  Location: [facility];  Service: Gastroenterology;  Laterality: N/A;   DILATION AND CURETTAGE OF UTERUS     ESOPHAGOGASTRODUODENOSCOPY (EGD)  WITH PROPOFOL N/A 01/26/2017   Procedure: ESOPHAGOGASTRODUODENOSCOPY (EGD) WITH PROPOFOL;  Surgeon: Toledo, Boykin Nearing, MD;  Location: [facility];  Service: Gastroenterology;  Laterality: N/A;   ESOPHAGOGASTRODUODENOSCOPY (EGD) WITH PROPOFOL N/A 07/26/2020   Procedure: ESOPHAGOGASTRODUODENOSCOPY (EGD) WITH PROPOFOL;  Surgeon: Toledo, Boykin Nearing, MD;  Location: [facility];  Service: Gastroenterology;  Laterality: N/A;   HEMORRHOID SURGERY N/A 07/25/2016   Procedure: HEMORRHOIDECTOMY INTERNAL AND EXTERNAL;  Surgeon: Nadeen Landau, MD;  Location: [facility];  Service: General;  Laterality: N/A;   MICROLARYNGOSCOPY N/A 01/29/2019   Procedure: MICROLARYNGOSCOPY with Vocal Cord polyp removal.   No laser is needed.;  Surgeon: Geanie Logan, MD;  Location: [facility];  Service: ENT;  Laterality: N/A;   VASCULAR SURGERY Right    Carotid Endarterectomy     Allergies  Allergen Reactions   Buspirone     Other reaction(s): Headache   Chantix [Varenicline Tartrate] Other (See Comments)    Jittery muscle movements   Statins Other (See Comments)    Muscle and joint aches - can take in small doses   Sulfa Antibiotics     Other reaction(s): Unknown   Zithromax [Azithromycin] Other (See Comments)    Back Pain   Elemental Sulfur  Rash    Current Outpatient Medications  Medication Sig Dispense Refill   acetaminophen (TYLENOL) 500 MG tablet Take 1,000 mg by mouth every 6 (six) hours as needed.     albuterol (PROVENTIL HFA;VENTOLIN HFA) 108 (90 Base) MCG/ACT inhaler Inhale 2 puffs into the lungs every 6 (six) hours as needed for wheezing or shortness of breath.     albuterol (PROVENTIL) (2.5 MG/3ML) 0.083% nebulizer solution Take 3 mLs (2.5 mg total) by nebulization every 6 (six) hours as needed for wheezing or shortness of breath. 75 mL 0   buPROPion (WELLBUTRIN XL) 150 MG 24 hr tablet Take 150 mg by mouth daily.     clopidogrel (PLAVIX) 75 MG tablet Take 75 mg by mouth daily.     escitalopram  (LEXAPRO) 20 MG tablet Take 20 mg by mouth daily.     pantoprazole (PROTONIX) 40 MG tablet Take 40 mg by mouth daily.     REPATHA SURECLICK 140 MG/ML SOAJ Inject 1 mL into the skin every 14 (fourteen) days.     TRELEGY ELLIPTA 100-62.5-25 MCG/ACT AEPB Inhale 1 puff into the lungs daily.     amLODipine (NORVASC) 5 MG tablet Take 1 tablet (5 mg total) by mouth daily. 30 tablet 0   No current facility-administered medications for this visit.    Family History Family History  Problem Relation Age of Onset   Breast cancer Mother 72   Atrial fibrillation Mother    Breast cancer Sister 5   Heart attack Father    Colon cancer Brother    Lung cancer Brother       Social History Social History   Tobacco Use   Smoking status: Every Day    Current packs/day: 1.00    Average packs/day: 1 pack/day for 45.0 years (45.0 ttl pk-yrs)    Types: Cigarettes    Passive exposure: Never   Smokeless tobacco: Never  Vaping Use   Vaping status: Never Used  Substance Use Topics   Alcohol use: No   Drug use: No        Review of Systems  Constitutional: Negative.   HENT:  Positive for hearing loss and tinnitus.   Eyes: Negative.   Respiratory:  Positive for cough, sputum production, shortness of breath and wheezing.   Cardiovascular: Negative.   Gastrointestinal:  Positive for abdominal pain, heartburn, nausea and vomiting.  Genitourinary: Negative.   Skin: Negative.   Neurological:  Positive for dizziness and headaches.  Psychiatric/Behavioral:  Positive for depression.      Physical Exam Blood pressure (!) 150/70, pulse 85, temperature 98 F (36.7 C), height 5\' 2"  (1.575 m), weight 147 lb (66.7 kg), SpO2 97%. Last Weight  Most recent update: 02/06/2023  3:24 PM    Weight  66.7 kg (147 lb)             CONSTITUTIONAL: Well developed, and nourished, appropriately responsive and aware without distress.   EYES: Sclera non-icteric.   EARS, NOSE, MOUTH AND THROAT:  The oropharynx is  clear. Oral mucosa is pink and moist.    Hearing is intact to voice.  NECK: Trachea is midline, and there is no jugular venous distension.  LYMPH NODES:  Lymph nodes in the neck are not appreciated. RESPIRATORY:  Lungs are clear, and breath sounds are equal bilaterally.  CARDIOVASCULAR:  Well perfused.  GI: The abdomen is soft, nontender, and nondistended. There were no palpable masses.  I did not appreciate hepatosplenomegaly. There were normal bowel sounds.   GU: Minimal  grade 1-2 hemorrhoids, no evidence of fissure, some rectocele, non-tender on exam.  Caryl Lyn present as chaperone. MUSCULOSKELETAL:  Symmetrical muscle tone appreciated in all four extremities.    SKIN: Skin turgor is normal. No pathologic skin lesions appreciated.  NEUROLOGIC:  Motor and sensation appear grossly normal.  Cranial nerves are grossly without defect. PSYCH:  Alert and oriented to person, place and time. Affect is appropriate for situation.  Data Reviewed I have personally reviewed what is currently available of the patient's imaging, recent labs and medical records.   Labs:     Latest Ref Rng & Units 03/16/2022    4:48 AM 03/15/2022    4:53 PM 11/15/2021   11:31 AM  CBC  WBC 4.0 - 10.5 K/uL 7.1  9.0  9.1   Hemoglobin 12.0 - 15.0 g/dL 16.1  09.6  04.5   Hematocrit 36.0 - 46.0 % 39.8  38.2  47.5   Platelets 150 - 400 K/uL 219  219  221       Latest Ref Rng & Units 03/16/2022    4:48 AM 03/15/2022    4:53 PM 11/15/2021   11:31 AM  CMP  Glucose 70 - 99 mg/dL 409  811  914   BUN 8 - 23 mg/dL 13  11  13    Creatinine 0.44 - 1.00 mg/dL 7.82  9.56  2.13   Sodium 135 - 145 mmol/L 145  143  141   Potassium 3.5 - 5.1 mmol/L 4.0  3.3  4.1   Chloride 98 - 111 mmol/L 115  111  108   CO2 22 - 32 mmol/L 24  26  28    Calcium 8.9 - 10.3 mg/dL 9.2  9.1  9.6   Total Protein 6.5 - 8.1 g/dL 7.6  7.4  7.5   Total Bilirubin 0.3 - 1.2 mg/dL 0.8  0.7  1.0   Alkaline Phos 38 - 126 U/L 65  65  60   AST 15 - 41 U/L 21  23   19    ALT 0 - 44 U/L 21  21  18        Imaging: Radiological images reviewed:   Within last 24 hrs: No results found.  Assessment    Constipation with difficult bowel evacuation. Patient Active Problem List   Diagnosis Date Noted   Pain and swelling of lower leg 10/21/2022   COPD (chronic obstructive pulmonary disease) (HCC) 10/16/2022   Acute respiratory failure with hypoxia (HCC) 03/16/2022   Hypokalemia 03/16/2022   CAP (community acquired pneumonia) 03/15/2022   Stroke (HCC) 11/15/2021   COPD with acute exacerbation (HCC)    Coronary artery disease    Tobacco dependence due to cigarettes    PUD (peptic ulcer disease)    Acute ischemic stroke [facility])    Atherosclerosis of native arteries of extremity with intermittent claudication (HCC) 12/07/2020   Nausea and vomiting 09/30/2020   Stable angina pectoris (HCC) 06/15/2020   SOBOE (shortness of breath on exertion) 01/28/2020   Chronic venous insufficiency 09/04/2019   Hoarseness 01/19/2019   Thoracic aortic aneurysm without rupture (HCC) 04/19/2018   Vitamin D insufficiency 04/19/2018   Recurrent major depression in partial remission (HCC) 06/29/2017   SCC (squamous cell carcinoma) 03/20/2017   Skin lesion 03/13/2017   Precordial pain 01/04/2017   Current use of long term anticoagulation 09/14/2016   Carotid stenosis 08/24/2016   Hypertension 08/24/2016   Hyperlipidemia 08/24/2016   Fatigue 07/17/2016   Headache disorder 07/17/2016   Occlusion of left carotid  artery 06/22/2016   Personal history of tobacco use, presenting hazards to health 03/14/2016   Presbyesophagus 11/24/2015   Diastasis recti 11/11/2014   Herniated lumbar intervertebral disc 11/11/2014   Candidal vaginitis 11/09/2014   Epigastric pain 09/22/2014   Viral URI with cough 06/03/2014   Depression 05/04/2014   Personal history of transient ischemic attack (TIA), and cerebral infarction without residual deficits 03/05/2014   Pruritus 03/05/2014    Hiatal hernia with gastroesophageal reflux 01/15/2014   Cholelithiasis 12/16/2013   Fatty (change of) liver, not elsewhere classified 12/16/2013   Cerebrovascular accident (CVA) (HCC) 12/03/2013   Nicotine dependence, uncomplicated 12/03/2013   Hemorrhoids 11/18/2013   Pain of left sacroiliac joint 08/21/2013   Allergic rhinitis 06/30/2013   Chronic low back pain 12/23/2012   HNP (herniated nucleus pulposus), lumbar 12/23/2012   Osteopenia 09/18/2012    Plan    Advised to pursue a goal of 25 to 30 g of fiber daily.  Made aware that the majority of this may be through natural sources, but advised to be aware of actual consumption and to ensure minimal consumption by daily supplementation.  Various forms of supplements discussed.  Recommended Psyllium husk, that mixes well with applesauce, or the powder which goes down well shaken with chocolate milk.  Strongly advised to consume more fluids to ensure adequate hydration, instructed to watch color of urine to determine adequacy of hydration.  Clarity is pursued in urine output, and bowel activity that correlates to significant meal intake.   We need to avoid deferring having bowel movements, advised to take the time at the first sign of sensation, typically following meals, and in the morning.   Subsequent utilization of MiraLAX may be needed ensure at least daily movement, ideally twice daily bowel movements.  If multiple doses of MiraLAX are necessary utilize them. Never skip a day...  To be regular, we must do the above EVERY day.     Face-to-face time spent with the patient and accompanying care providers(if present) was 30 minutes, with more than 50% of the time spent counseling, educating, and coordinating care of the patient.    These notes generated with voice recognition software. I apologize for typographical errors.  Campbell Lerner M.D., FACS 02/06/2023, 4:13 PM

## 2023-02-06 ENCOUNTER — Ambulatory Visit: Payer: Medicare HMO | Admitting: Surgery

## 2023-02-06 ENCOUNTER — Encounter: Payer: Self-pay | Admitting: Surgery

## 2023-02-06 VITALS — BP 150/70 | HR 85 | Temp 98.0°F | Ht 62.0 in | Wt 147.0 lb

## 2023-02-06 DIAGNOSIS — K5902 Outlet dysfunction constipation: Secondary | ICD-10-CM | POA: Diagnosis not present

## 2023-02-06 NOTE — Patient Instructions (Addendum)
Advised to pursue a goal of 25 to 30 g of fiber daily.  Made aware that the majority of this may be through natural sources, but advised to be aware of actual consumption and to ensure minimal consumption by daily supplementation.  Various forms of supplements discussed.  Recommended Psyllium husk, that mixes well with applesauce, or the powder which goes down well shaken with chocolate milk.  Strongly advised to consume more fluids to ensure adequate hydration, instructed to watch color of urine to determine adequacy of hydration.  Clarity is pursued in urine output, and bowel activity that correlates to significant meal intake.   We need to avoid deferring having bowel movements, advised to take the time at the first sign of sensation, typically following meals, and in the morning.   Subsequent utilization of MiraLAX may be needed ensure at least daily movement, ideally twice daily bowel movements.  If multiple doses of MiraLAX are necessary utilize them. Never skip a day...  To be regular, we must do the above EVERY day.   We would like for you to start taking Metamucil(psyllium fiber) three times a day with plenty of water. Generic is ok. Do not skip a day.   We would like for you to follow up here in 1 month.

## 2023-02-13 DIAGNOSIS — K5902 Outlet dysfunction constipation: Secondary | ICD-10-CM | POA: Insufficient documentation

## 2023-03-13 ENCOUNTER — Ambulatory Visit: Payer: Medicare HMO | Admitting: Surgery

## 2023-04-18 ENCOUNTER — Other Ambulatory Visit: Payer: Self-pay | Admitting: Physician Assistant

## 2023-04-18 DIAGNOSIS — Z1231 Encounter for screening mammogram for malignant neoplasm of breast: Secondary | ICD-10-CM

## 2023-04-30 ENCOUNTER — Ambulatory Visit
Admission: RE | Admit: 2023-04-30 | Discharge: 2023-04-30 | Disposition: A | Discharge status: Home / Self Care | Payer: Medicare HMO | Source: Ambulatory Visit | Attending: Acute Care | Admitting: Acute Care

## 2023-04-30 DIAGNOSIS — F17210 Nicotine dependence, cigarettes, uncomplicated: Secondary | ICD-10-CM | POA: Diagnosis present

## 2023-04-30 DIAGNOSIS — Z122 Encounter for screening for malignant neoplasm of respiratory organs: Secondary | ICD-10-CM | POA: Diagnosis present

## 2023-04-30 DIAGNOSIS — Z87891 Personal history of nicotine dependence: Secondary | ICD-10-CM | POA: Insufficient documentation

## 2023-05-07 ENCOUNTER — Ambulatory Visit
Admission: RE | Admit: 2023-05-07 | Discharge: 2023-05-07 | Disposition: A | Discharge status: Home / Self Care | Payer: Medicare Other | Source: Ambulatory Visit | Attending: Family Medicine | Admitting: Family Medicine

## 2023-05-07 ENCOUNTER — Other Ambulatory Visit: Payer: Self-pay | Admitting: Family Medicine

## 2023-05-07 DIAGNOSIS — R1115 Cyclical vomiting syndrome unrelated to migraine: Secondary | ICD-10-CM | POA: Insufficient documentation

## 2023-05-07 DIAGNOSIS — R1032 Left lower quadrant pain: Secondary | ICD-10-CM | POA: Diagnosis present

## 2023-05-07 MED ORDER — IOHEXOL 300 MG/ML  SOLN
85.0000 mL | Freq: Once | INTRAMUSCULAR | Status: AC | PRN
  Administered 2023-05-07: 85 mL via INTRAVENOUS

## 2023-05-16 ENCOUNTER — Other Ambulatory Visit: Payer: Self-pay

## 2023-05-16 ENCOUNTER — Telehealth: Payer: Self-pay

## 2023-05-16 DIAGNOSIS — Z122 Encounter for screening for malignant neoplasm of respiratory organs: Secondary | ICD-10-CM

## 2023-05-16 DIAGNOSIS — F17210 Nicotine dependence, cigarettes, uncomplicated: Secondary | ICD-10-CM

## 2023-05-16 DIAGNOSIS — Z87891 Personal history of nicotine dependence: Secondary | ICD-10-CM

## 2023-05-16 NOTE — Telephone Encounter (Signed)
 Spoke with patient and reviewed CT results. Advised to continue annual screening with low-dose chest CT without contrast in 12 months for lung cancer. There is aortic atherosclerosis, in addition to three-vessel coronary artery disease. There are calcifications of the aortic valve. Echocardiographic correlation for evaluation of potential valvular dysfunction may be warranted if clinically indicated. Advised to discuss statin therapy with PCP and Cardiology. Patient advises she completed a Cardiac CT in 09/2022 for Cardiology and will discuss with them if additional treatment/imaging is needed. Results faxed to PCP and Cardiology. Results also sent to patient's mychart. No additional questions.

## 2023-07-06 ENCOUNTER — Ambulatory Visit
Admission: RE | Admit: 2023-07-06 | Discharge: 2023-07-06 | Disposition: A | Discharge status: Home / Self Care | Payer: Medicare Other | Source: Ambulatory Visit | Attending: Physician Assistant | Admitting: Physician Assistant

## 2023-07-06 DIAGNOSIS — Z1231 Encounter for screening mammogram for malignant neoplasm of breast: Secondary | ICD-10-CM | POA: Insufficient documentation

## 2023-08-03 ENCOUNTER — Ambulatory Visit: Admitting: Family Medicine

## 2023-09-18 ENCOUNTER — Encounter (INDEPENDENT_AMBULATORY_CARE_PROVIDER_SITE_OTHER): Payer: Self-pay

## 2023-10-15 ENCOUNTER — Ambulatory Visit (INDEPENDENT_AMBULATORY_CARE_PROVIDER_SITE_OTHER): Payer: Medicare HMO | Admitting: Vascular Surgery

## 2023-10-15 ENCOUNTER — Encounter (INDEPENDENT_AMBULATORY_CARE_PROVIDER_SITE_OTHER): Payer: Medicare HMO

## 2023-10-26 ENCOUNTER — Ambulatory Visit (INDEPENDENT_AMBULATORY_CARE_PROVIDER_SITE_OTHER): Admitting: Family Medicine

## 2023-10-26 ENCOUNTER — Encounter: Payer: Self-pay | Admitting: Family Medicine

## 2023-10-26 VITALS — BP 168/71 | HR 76 | Ht 62.0 in | Wt 132.0 lb

## 2023-10-26 DIAGNOSIS — F4321 Adjustment disorder with depressed mood: Secondary | ICD-10-CM | POA: Insufficient documentation

## 2023-10-26 DIAGNOSIS — I6523 Occlusion and stenosis of bilateral carotid arteries: Secondary | ICD-10-CM

## 2023-10-26 DIAGNOSIS — F17210 Nicotine dependence, cigarettes, uncomplicated: Secondary | ICD-10-CM

## 2023-10-26 DIAGNOSIS — M8580 Other specified disorders of bone density and structure, unspecified site: Secondary | ICD-10-CM

## 2023-10-26 DIAGNOSIS — N3946 Mixed incontinence: Secondary | ICD-10-CM | POA: Insufficient documentation

## 2023-10-26 DIAGNOSIS — I10 Essential (primary) hypertension: Secondary | ICD-10-CM | POA: Diagnosis not present

## 2023-10-26 DIAGNOSIS — Z7689 Persons encountering health services in other specified circumstances: Secondary | ICD-10-CM

## 2023-10-26 DIAGNOSIS — Z13820 Encounter for screening for osteoporosis: Secondary | ICD-10-CM

## 2023-10-26 DIAGNOSIS — J449 Chronic obstructive pulmonary disease, unspecified: Secondary | ICD-10-CM

## 2023-10-26 DIAGNOSIS — I25119 Atherosclerotic heart disease of native coronary artery with unspecified angina pectoris: Secondary | ICD-10-CM

## 2023-10-26 DIAGNOSIS — E782 Mixed hyperlipidemia: Secondary | ICD-10-CM

## 2023-10-26 DIAGNOSIS — H9190 Unspecified hearing loss, unspecified ear: Secondary | ICD-10-CM | POA: Insufficient documentation

## 2023-10-26 DIAGNOSIS — N951 Menopausal and female climacteric states: Secondary | ICD-10-CM

## 2023-10-26 DIAGNOSIS — F332 Major depressive disorder, recurrent severe without psychotic features: Secondary | ICD-10-CM

## 2023-10-26 MED ORDER — AMLODIPINE BESYLATE 5 MG PO TABS: 5.0000 mg | ORAL_TABLET | Freq: Every day | ORAL | 0 refills | Status: DC

## 2023-10-26 MED ORDER — ESTRADIOL 0.1 MG/GM VA CREA: TOPICAL_CREAM | VAGINAL | 12 refills | Status: DC

## 2023-10-26 NOTE — Progress Notes (Signed)
 New Patient Office Visit  Introduced to nurse practitioner role and practice setting.  All questions answered.  Discussed provider/patient relationship and expectations.   Subjective    Patient ID: Molly Matthews, female    DOB: 11-23-1950  Age: 73 y.o. MRN: [id number]  CC:  Chief Complaint  Patient presents with   New Patient (Initial Visit)    Patient is present to establish care with new provider. Reports concerns with urine   Urinary Incontinence   HPI  Discussed the use of AI scribe software for clinical note transcription with the patient, who gave verbal consent to proceed.  History of Present Illness Molly Matthews is a 73 year old female who presents to establish care with a new primary care provider.  She is experiencing significant grief following the accidental death of her three-year-old great-grandson seven weeks ago. She feels nervous all the time and is currently taking 30 mg of Lexapro  daily, which she increased two weeks ago. She also takes Klonopin 0.5mg  prn nightly due to the significant stress due to her loss, which she started after the incident. She expresses profound sadness and a broken heart over the loss, stating that she misses her great-grandson terribly and struggles with the memories of her time together.  She reports urinary and fecal incontinence, describing episodes where she cannot control her bowel movements, which occur suddenly and without warning. She has not previously seen a pelvic floor therapist or used topical estrogen cream for these issues.  She has a history of COPD, which she states is not bad, and uses Trelegy Ellipta daily, rinsing her mouth after use. She also has albuterol  and a nebulizer at home, though she does not use supplemental oxygen. She sees Dr. Theotis for her COPD management.  She has a history of carotid artery disease with one artery completely closed. She takes Plavix  and sees cards annually for monitoring with  ultrasound. She has experienced three TIAs in the past.  She has a history of skin cancer on her right leg and colon polyps, with one removed from her throat. She experiences chest pain when anxious, which she attributes to her broken heart.  She has a history of high blood pressure but stopped taking amlodipine  during the recent months during her grieving process and concerns for lightheadness, which she feels was more related to anxiety than BP meds. Her home blood pressure readings are usually around 136 systolic. Denies syncope, chest pain, headaches, vision changes.  She smokes a pack of cigarettes daily, sometimes more depending on her anxiety levels. She has a family history of lung cancer, with two brothers who died from the disease. She undergoes annual lung cancer screening, with the last one in December 2024.  She has hearing difficulties and owns three pairs of hearing aids but does not wear them regularly. She has not seen an audiologist in a long time.   Outpatient Encounter Medications as of 10/26/2023  Medication Sig   acetaminophen  (TYLENOL ) 500 MG tablet Take 1,000 mg by mouth every 6 (six) hours as needed.   clonazePAM (KLONOPIN) 0.5 MG tablet Take 0.5 mg by mouth.   clopidogrel  (PLAVIX ) 75 MG tablet Take 75 mg by mouth daily.   escitalopram  (LEXAPRO ) 10 MG tablet Take 10 mg by mouth daily.   escitalopram  (LEXAPRO ) 20 MG tablet Take 20 mg by mouth daily.   estradiol (ESTRACE VAGINAL) 0.1 MG/GM vaginal cream 1 applicator full Nightly for two weeks, then may reduce to 2-3x per week.  pantoprazole  (PROTONIX ) 40 MG tablet Take 40 mg by mouth daily.   REPATHA SURECLICK 140 MG/ML SOAJ Inject 1 mL into the skin every 14 (fourteen) days.   tiZANidine (ZANAFLEX) 4 MG tablet Take 4 mg by mouth.   TRELEGY ELLIPTA 100-62.5-25 MCG/ACT AEPB Inhale 1 puff into the lungs daily.   albuterol  (PROVENTIL  HFA;VENTOLIN  HFA) 108 (90 Base) MCG/ACT inhaler Inhale 2 puffs into the lungs every 6 (six)  hours as needed for wheezing or shortness of breath. (Patient not taking: Reported on 10/26/2023)   albuterol  (PROVENTIL ) (2.5 MG/3ML) 0.083% nebulizer solution Take 3 mLs (2.5 mg total) by nebulization every 6 (six) hours as needed for wheezing or shortness of breath. (Patient not taking: Reported on 10/26/2023)   amLODipine  (NORVASC ) 5 MG tablet Take 1 tablet (5 mg total) by mouth daily.   buPROPion (WELLBUTRIN XL) 150 MG 24 hr tablet Take 150 mg by mouth daily. (Patient not taking: Reported on 10/26/2023)   [DISCONTINUED] amLODipine  (NORVASC ) 5 MG tablet Take 1 tablet (5 mg total) by mouth daily. (Patient not taking: Reported on 10/26/2023)   No facility-administered encounter medications on file as of 10/26/2023.    Past Medical History:  Diagnosis Date   Anginal pain (HCC)    Anxiety    Aortic atherosclerosis (HCC)    Arthritis    Cancer (HCC) 01/2017   Skin cancer on right leg   Carotid stenosis, left    100 % blockage   Cholelithiasis    Chronic enteritis    Chronic venous insufficiency    Colon polyps    COPD (chronic obstructive pulmonary disease) (HCC)    mild   Coronary artery disease    Depression    Diastasis recti    Diverticulosis    Dyspnea    Erosive esophagitis    Fatty liver disease, nonalcoholic    Gastritis    GERD (gastroesophageal reflux disease)    Headache    Herniated lumbar intervertebral disc    Hyperlipidemia    Hypertension    IBS (irritable bowel syndrome)    Left carotid artery stenosis    Osteopenia    Peptic ulcer disease    Personal history of tobacco use, presenting hazards to health 03/14/2016   SCC (squamous cell carcinoma)    Stroke (HCC)    TIA's X 2   Tobacco dependence    Vitamin D deficiency     Past Surgical History:  Procedure Laterality Date   ABDOMINAL HYSTERECTOMY     BREAST BIOPSY Left    negative 2008   CAROTID ENDARTERECTOMY     CHOLECYSTECTOMY     COLONOSCOPY W/ POLYPECTOMY     COLONOSCOPY WITH PROPOFOL  N/A  07/26/2020   Procedure: COLONOSCOPY WITH PROPOFOL ;  Surgeon: Toledo, Ladell POUR, MD;  Location: [facility];  Service: Gastroenterology;  Laterality: N/A;   DILATION AND CURETTAGE OF UTERUS     ESOPHAGOGASTRODUODENOSCOPY (EGD) WITH PROPOFOL  N/A 01/26/2017   Procedure: ESOPHAGOGASTRODUODENOSCOPY (EGD) WITH PROPOFOL ;  Surgeon: Toledo, Ladell POUR, MD;  Location: [facility];  Service: Gastroenterology;  Laterality: N/A;   ESOPHAGOGASTRODUODENOSCOPY (EGD) WITH PROPOFOL  N/A 07/26/2020   Procedure: ESOPHAGOGASTRODUODENOSCOPY (EGD) WITH PROPOFOL ;  Surgeon: Toledo, Ladell POUR, MD;  Location: [facility];  Service: Gastroenterology;  Laterality: N/A;   HEMORRHOID SURGERY N/A 07/25/2016   Procedure: HEMORRHOIDECTOMY INTERNAL AND EXTERNAL;  Surgeon: Larinda Unknown Sharps, MD;  Location: [facility];  Service: General;  Laterality: N/A;   MICROLARYNGOSCOPY N/A 01/29/2019   Procedure: MICROLARYNGOSCOPY with Vocal Cord polyp removal.  No laser is needed.;  Surgeon: Blair Mt, MD;  Location: [facility];  Service: ENT;  Laterality: N/A;   VASCULAR SURGERY Right    Carotid Endarterectomy     Family History  Problem Relation Age of Onset   Breast cancer Mother 65   Atrial fibrillation Mother    Breast cancer Sister 6   Heart attack Father    Colon cancer Brother    Lung cancer Brother     Social History   Socioeconomic History   Marital status: Married    Spouse name: Not on file   Number of children: Not on file   Years of education: Not on file   Highest education level: Not on file  Occupational History   Not on file  Tobacco Use   Smoking status: Every Day    Current packs/day: 1.00    Average packs/day: 1 pack/day for 45.0 years (45.0 ttl pk-yrs)    Types: Cigarettes    Passive exposure: Never   Smokeless tobacco: Never  Vaping Use   Vaping status: Former  Substance and Sexual Activity   Alcohol use: No   Drug use: No   Sexual activity: Yes    Birth control/protection: Surgical     Comment: Hysterectomy  Other Topics Concern   Not on file  Social History Narrative   Not on file   Social Drivers of Health   Financial Resource Strain: Low Risk  (09/26/2023)   Received [facility]   Overall Financial Resource Strain (CARDIA)    Difficulty of Paying Living Expenses: Not hard at all  Food Insecurity: No Food Insecurity (09/26/2023)   [facility]   Hunger Vital Sign    Within the past 12 months, you worried that your food would run out before you got the money to buy more.: Never true    Within the past 12 months, the food you bought just didn't last and you didn't have money to get more.: Never true  Transportation Needs: No Transportation Needs (09/26/2023)   Received from [facility] - Transportation    In the past 12 months, has lack of transportation kept you from medical appointments or from getting medications?: No    Lack of Transportation (Non-Medical): No  Physical Activity: Not on file  Stress: Not on file  Social Connections: Not on file  Intimate Partner Violence: Not At Risk (03/15/2022)   Humiliation, Afraid, Rape, and Kick questionnaire    Fear of Current or Ex-Partner: No    Emotionally Abused: No    Physically Abused: No    Sexually Abused: No    Review of Systems  All other systems reviewed and are negative.       Objective    BP (!) 168/71 (BP Location: Left Arm, Patient Position: Sitting)   Pulse 76   Ht 5' 2 (1.575 m)   Wt 132 lb (59.9 kg)   SpO2 97%   BMI 24.14 kg/m   Physical Exam Constitutional:      General: She is not in acute distress.    Appearance: Normal appearance. She is normal weight. She is not toxic-appearing or diaphoretic.  HENT:     Head: Normocephalic.     Nose: Nose normal.     Mouth/Throat:     Mouth: Mucous membranes are dry.     Pharynx: Oropharynx is clear.   Eyes:     Extraocular Movements: Extraocular movements  intact.  Pupils: Pupils are equal, round, and reactive to light.   Neck:     Vascular: No carotid bruit.   Cardiovascular:     Rate and Rhythm: Normal rate and regular rhythm.     Pulses: Normal pulses.     Heart sounds: Normal heart sounds. No murmur heard.    No friction rub. No gallop.  Pulmonary:     Effort: Pulmonary effort is normal. No respiratory distress.     Breath sounds: Normal breath sounds. No stridor. No wheezing, rhonchi or rales.  Chest:     Chest wall: No tenderness.   Musculoskeletal:        General: No swelling, tenderness, deformity or signs of injury.     Cervical back: No rigidity or tenderness.     Right lower leg: No edema.     Left lower leg: No edema.  Lymphadenopathy:     Cervical: No cervical adenopathy.   Skin:    General: Skin is warm and dry.     Capillary Refill: Capillary refill takes less than 2 seconds.     Comments: Few scattered bruises on legs - states from her dog.   Neurological:     General: No focal deficit present.     Mental Status: She is alert and oriented to person, place, and time. Mental status is at baseline.     Cranial Nerves: No cranial nerve deficit.     Sensory: No sensory deficit.     Motor: No weakness.     Coordination: Coordination normal.     Gait: Gait normal.   Psychiatric:        Attention and Perception: Attention normal.        Mood and Affect: Mood is anxious and depressed. Affect is tearful.        Speech: Speech normal.        Behavior: Behavior normal. Behavior is cooperative.        Thought Content: Thought content normal.        Cognition and Memory: Cognition and memory normal.        Judgment: Judgment normal.        Assessment & Plan:  Assessment and Plan Assessment & Plan Grief and Anxiety Significant grief and anxiety after great-grandson's death. Persistent crying, nervousness, difficulty coping. On Lexapro  30 mg and Klonopin as needed. Open to counseling. - Continue Lexapro  30 mg  daily. - Continue Klonopin as needed- risks of benzos discuss. Given situational - okay for short term. - Refer to cognitive behavioral therapy for grief counseling. - Follow up in 2-3 weeks  Hypertension Hypertension with recent amlodipine  discontinuation due to lightheadedness,pt believes related to anxiety. Home BP around 1130s Concern for uncontrolled BP with carotid artery disease. Discussed importance of BP control. - Restart amlodipine  5 mg daily. - Monitor BP at home,GOAL<130/80 - report any dizziness, headaches, vision changes, chest pain, weakness - Follow up in 2-3 weeks to reassess BP management.  Carotid Artery Disease Carotid artery disease with one artery occluded. On Plavix . Annual cardiologist monitoring. Discussed importance of monitoring and medication adherence. - Continue Plavix  as prescribed. - On repatha  Chronic Obstructive Pulmonary Disease (COPD) COPD well-managed, no significant symptoms. Uses Trelegy Ellipta daily, albuterol  and nebulizer as needed. - Continue Trelegy Ellipta daily. - Use albuterol  and nebulizer as needed for acute symptoms.  Urinary and Fecal Incontinence Reports urinary and fecal incontinence with urgency. Discussed benefits of pelvic floor therapy and topical estrogen. - Refer to pelvic floor physical therapy. -  Refer to urogynecologist for further evaluation. - Prescribe topical estrogen cream for vaginal application to assist in post menopasual vaginal atrophy  Osteopenia Osteopenia, due for rescreening. Discussed importance of regular screening to monitor bone health. - Order bone density scan for osteoporosis screening.  Tobacco Use - discussed cessation, pt aware- given recent tragic loss is pre-contemplation at this time - Risks aware - UTD on lung CA screening  General Health Maintenance Smoker with family history of lung cancer. Undergoes annual lung cancer screening. Has hearing aids but does not wear them regularly.  Discussed importance of hearing aid use and audiology evaluation. - Continue annual lung cancer screening. - Encourage wearing hearing aids regularly. - Recommend audiology evaluation for hearing rescreening.  Follow-up Requires follow-up to assess management of multiple conditions and response to new interventions. - Follow up in 2-3 weeks to assess mood, anxiety, blood pressure, and response to referrals and new treatments. Further discussion as new patient. - will check labs at follow up   Mixed stress and urge urinary incontinence -     Ambulatory referral to Physical Therapy -     Ambulatory referral to Urogynecology  Grief at loss of child -     Ambulatory referral to Psychiatry  Bilateral carotid artery stenosis  Primary hypertension -     amLODIPine  Besylate; Take 1 tablet (5 mg total) by mouth daily.  Dispense: 30 tablet; Refill: 0  Chronic obstructive pulmonary disease, unspecified COPD type (HCC)  Osteopenia, unspecified location  Mixed hyperlipidemia  Coronary artery disease involving native coronary artery of native heart with angina pectoris (HCC)  Severe episode of recurrent major depressive disorder, without psychotic features (HCC) -     Ambulatory referral to Psychiatry  Cigarette nicotine  dependence without complication  Vaginal dryness, menopausal -     Estradiol; 1 applicator full Nightly for two weeks, then may reduce to 2-3x per week.  Dispense: 42.5 g; Refill: 12  Screening for osteoporosis -     DG Bone Density; Future  Hard of hearing  Encounter to establish care with new doctor    Return in about 2 weeks (around 11/09/2023) for mood and HTN.   I, Curtis DELENA Boom, FNP, have reviewed all documentation for this visit. The documentation on 10/26/23 for the exam, diagnosis, procedures, and orders are all accurate and complete.   Curtis DELENA Boom, FNP

## 2023-11-09 ENCOUNTER — Other Ambulatory Visit (INDEPENDENT_AMBULATORY_CARE_PROVIDER_SITE_OTHER): Payer: Self-pay | Admitting: Vascular Surgery

## 2023-11-09 DIAGNOSIS — I6523 Occlusion and stenosis of bilateral carotid arteries: Secondary | ICD-10-CM

## 2023-11-15 ENCOUNTER — Ambulatory Visit (INDEPENDENT_AMBULATORY_CARE_PROVIDER_SITE_OTHER): Admitting: Vascular Surgery

## 2023-11-15 ENCOUNTER — Encounter (INDEPENDENT_AMBULATORY_CARE_PROVIDER_SITE_OTHER): Payer: Self-pay | Admitting: Vascular Surgery

## 2023-11-15 ENCOUNTER — Ambulatory Visit (INDEPENDENT_AMBULATORY_CARE_PROVIDER_SITE_OTHER)

## 2023-11-15 VITALS — BP 149/74 | HR 73 | Resp 18 | Wt 130.4 lb

## 2023-11-15 DIAGNOSIS — I6523 Occlusion and stenosis of bilateral carotid arteries: Secondary | ICD-10-CM

## 2023-11-15 DIAGNOSIS — I872 Venous insufficiency (chronic) (peripheral): Secondary | ICD-10-CM | POA: Diagnosis not present

## 2023-11-15 DIAGNOSIS — J449 Chronic obstructive pulmonary disease, unspecified: Secondary | ICD-10-CM

## 2023-11-15 DIAGNOSIS — I70211 Atherosclerosis of native arteries of extremities with intermittent claudication, right leg: Secondary | ICD-10-CM | POA: Diagnosis not present

## 2023-11-15 DIAGNOSIS — I7123 Aneurysm of the descending thoracic aorta, without rupture: Secondary | ICD-10-CM | POA: Diagnosis not present

## 2023-11-16 ENCOUNTER — Encounter (INDEPENDENT_AMBULATORY_CARE_PROVIDER_SITE_OTHER): Payer: Self-pay | Admitting: Vascular Surgery

## 2023-11-16 NOTE — Progress Notes (Signed)
 MRN : [id number]  Molly Matthews is a 73 y.o. (1950-05-30) female who presents with chief complaint of check carotid arteries.  History of Present Illness:   The patient is seen for follow up evaluation of carotid stenosis. The carotid stenosis followed by ultrasound.    The patient denies amaurosis fugax. There is no recent history of TIA symptoms or focal motor deficits. There is no prior documented CVA.   The patient is taking enteric-coated aspirin  81 mg daily.   There is no history of migraine headaches. There is no history of seizures.   The patient is also here for followup and review of noninvasive studies of the lower extremities. There have been no interval changes in lower extremity symptoms. No interval shortening of the patient's claudication distance or development of rest pain symptoms. However, she does have occasional episodes of numbness in her right foot which gets better when she stands.  No new ulcers or wounds have occurred since the last visit.   There has been a significant changes to the patient's overall health care.  At today's appointment she is also complaining of a significant increase in her leg pain and a marked decrease in her claudication distance.  She is also describing mild rest pain symptoms on the right compared to the left.  She denies dangling her foot off the side of the bed while trying to sleep.  There have been no new open wounds or sores.  The patient has no had any past angiography, interventions or vascular surgery.     Patient is also followed for leg swelling. The patient first noticed the swelling remotely. The swelling is associated with pain and discoloration. The pain and swelling worsens with prolonged dependency and improves with elevation. The pain is unrelated to activity.  The patient notes that in the morning the legs are significantly improved but they steadily worsened throughout the course of the day. The  patient also notes a steady worsening of the discoloration in the ankle and shin area.  Overall she states this particular issue has been stable compared to her previous visit.   Elevation makes the leg symptoms better, dependency makes them much worse. There is no history of ulcerations. The patient denies any recent changes in medications.   The patient has not been wearing graduated compression.   The patient denies a history of DVT or PE. There is no prior history of phlebitis. There is no history of primary lymphedema.   No history of malignancies. No history of trauma or groin or pelvic surgery. There is no history of radiation treatment to the groin or pelvis   The patient has a history of coronary artery disease, no recent episodes of angina or shortness of breath. There is a history of hyperlipidemia which is being treated with a statin.     Carotid Duplex done today shows RICA 40-59% (s/p right CEA) and known occlusion of the left ICA.  No significant change compared to last study in 10/13/2021   Previous ABI's Rt=1.00 (biphasic) and Lt=1.11 (triphasic) (previous ABI Rt=0.98 and Lt=1.15).   CT scanning of the chest for lung cancer screening dated 05/01/2022 is reviewed by me demonstrates a stable saccular descending thoracic aortic aneurysm measuring 3.8 cm in maximal dimension  No outpatient medications have been marked as taking for the 11/15/23 encounter (Office Visit) with  Juelle Dickmann, Cordella MATSU, MD.    Past Medical History:  Diagnosis Date   Anginal pain (HCC)    Anxiety    Aortic atherosclerosis (HCC)    Arthritis    Cancer (HCC) 01/2017   Skin cancer on right leg   Carotid stenosis, left    100 % blockage   Cholelithiasis    Chronic enteritis    Chronic venous insufficiency    Colon polyps    COPD (chronic obstructive pulmonary disease) (HCC)    mild   Coronary artery disease    Depression    Diastasis recti    Diverticulosis    Dyspnea    Erosive esophagitis     Fatty liver disease, nonalcoholic    Gastritis    GERD (gastroesophageal reflux disease)    Headache    Herniated lumbar intervertebral disc    Hyperlipidemia    Hypertension    IBS (irritable bowel syndrome)    Left carotid artery stenosis    Osteopenia    Peptic ulcer disease    Personal history of tobacco use, presenting hazards to health 03/14/2016   SCC (squamous cell carcinoma)    Stroke (HCC)    TIA's X 2   Tobacco dependence    Vitamin D deficiency     Past Surgical History:  Procedure Laterality Date   ABDOMINAL HYSTERECTOMY     BREAST BIOPSY Left    negative 2008   CAROTID ENDARTERECTOMY     CHOLECYSTECTOMY     COLONOSCOPY W/ POLYPECTOMY     COLONOSCOPY WITH PROPOFOL  N/A 07/26/2020   Procedure: COLONOSCOPY WITH PROPOFOL ;  Surgeon: Toledo, Ladell POUR, MD;  Location: [facility];  Service: Gastroenterology;  Laterality: N/A;   DILATION AND CURETTAGE OF UTERUS     ESOPHAGOGASTRODUODENOSCOPY (EGD) WITH PROPOFOL  N/A 01/26/2017   Procedure: ESOPHAGOGASTRODUODENOSCOPY (EGD) WITH PROPOFOL ;  Surgeon: Toledo, Ladell POUR, MD;  Location: [facility];  Service: Gastroenterology;  Laterality: N/A;   ESOPHAGOGASTRODUODENOSCOPY (EGD) WITH PROPOFOL  N/A 07/26/2020   Procedure: ESOPHAGOGASTRODUODENOSCOPY (EGD) WITH PROPOFOL ;  Surgeon: Toledo, Ladell POUR, MD;  Location: [facility];  Service: Gastroenterology;  Laterality: N/A;   HEMORRHOID SURGERY N/A 07/25/2016   Procedure: HEMORRHOIDECTOMY INTERNAL AND EXTERNAL;  Surgeon: Larinda Unknown Sharps, MD;  Location: [facility];  Service: General;  Laterality: N/A;   MICROLARYNGOSCOPY N/A 01/29/2019   Procedure: MICROLARYNGOSCOPY with Vocal Cord polyp removal.   No laser is needed.;  Surgeon: Blair Mt, MD;  Location: [facility];  Service: ENT;  Laterality: N/A;   VASCULAR SURGERY Right    Carotid Endarterectomy     Social History Social History   Tobacco Use   Smoking status: Every Day    Current packs/day: 1.00    Average  packs/day: 1 pack/day for 45.0 years (45.0 ttl pk-yrs)    Types: Cigarettes    Passive exposure: Never   Smokeless tobacco: Never  Vaping Use   Vaping status: Former  Substance Use Topics   Alcohol use: No   Drug use: No    Family History Family History  Problem Relation Age of Onset   Breast cancer Mother 2   Atrial fibrillation Mother    Breast cancer Sister 68   Heart attack Father    Colon cancer Brother    Lung cancer Brother     Allergies  Allergen Reactions   Buspirone     Other reaction(s): Headache   Chantix [Varenicline Tartrate] Other (See Comments)    Jittery muscle movements   Statins Other (See Comments)  Muscle and joint aches - can take in small doses   Sulfa Antibiotics     Other reaction(s): Unknown   Zithromax [Azithromycin] Other (See Comments)    Back Pain   Elemental Sulfur Rash     REVIEW OF SYSTEMS (Negative unless checked)  Constitutional: [] Weight loss  [] Fever  [] Chills Cardiac: [] Chest pain   [] Chest pressure   [] Palpitations   [] Shortness of breath when laying flat   [] Shortness of breath with exertion. Vascular:  [x] Pain in legs with walking   [x] Pain in legs at rest  [] History of DVT   [] Phlebitis   [] Swelling in legs   [] Varicose veins   [] Non-healing ulcers Pulmonary:   [] Uses home oxygen   [] Productive cough   [] Hemoptysis   [] Wheeze  [] COPD   [] Asthma Neurologic:  [] Dizziness   [] Seizures   [] History of stroke   [] History of TIA  [] Aphasia   [] Vissual changes   [] Weakness or numbness in arm   [] Weakness or numbness in leg Musculoskeletal:   [] Joint swelling   [x] Joint pain   [x] Low back pain Hematologic:  [] Easy bruising  [] Easy bleeding   [] Hypercoagulable state   [] Anemic Gastrointestinal:  [] Diarrhea   [] Vomiting  [] Gastroesophageal reflux/heartburn   [] Difficulty swallowing. Genitourinary:  [] Chronic kidney disease   [] Difficult urination  [] Frequent urination   [] Blood in urine Skin:  [] Rashes   [] Ulcers  Psychological:   [] History of anxiety   []  History of major depression.  Physical Examination  Vitals:   11/15/23 1122  BP: (!) 149/74  Pulse: 73  Resp: 18  Weight: 130 lb 6.4 oz (59.1 kg)   Body mass index is 23.85 kg/m. Gen: WD/WN, NAD Head: [place]/AT, No temporalis wasting.  Ear/Nose/Throat: Hearing grossly intact, nares w/o erythema or drainage Eyes: PER, EOMI, sclera nonicteric.  Neck: Supple, no masses.  No bruit or JVD.  Pulmonary:  Good air movement, no audible wheezing, no use of accessory muscles.  Cardiac: RRR, normal S1, S2, no Murmurs. Vascular:  carotid bruit noted Vessel Right Left  Radial Palpable Palpable  Carotid  Palpable  Palpable  PT Not palpable Not palpable  DP Not palpable Not palpable  Gastrointestinal: soft, non-distended. No guarding/no peritoneal signs.  Musculoskeletal: M/S 5/5 throughout.  No visible deformity.  Neurologic: CN 2-12 intact. Pain and light touch intact in extremities.  Symmetrical.  Speech is fluent. Motor exam as listed above. Psychiatric: Judgment intact, Mood & affect appropriate for pt's clinical situation. Dermatologic: No rashes or ulcers noted.  No changes consistent with cellulitis.   CBC Lab Results  Component Value Date   WBC 7.1 03/16/2022   HGB 13.8 03/16/2022   HCT 39.8 03/16/2022   MCV 92.6 03/16/2022   PLT 219 03/16/2022    BMET    Component Value Date/Time   NA 145 03/16/2022 0448   NA 142 01/29/2014 2124   K 4.0 03/16/2022 0448   K 4.0 01/29/2014 2124   CL 115 (H) 03/16/2022 0448   CL 110 (H) 01/29/2014 2124   CO2 24 03/16/2022 0448   CO2 25 01/29/2014 2124   GLUCOSE 183 (H) 03/16/2022 0448   GLUCOSE 137 (H) 01/29/2014 2124   BUN 13 03/16/2022 0448   BUN 10 01/29/2014 2124   CREATININE 0.62 03/16/2022 0448   CREATININE 1.00 01/29/2014 2124   CALCIUM  9.2 03/16/2022 0448   CALCIUM  9.3 01/29/2014 2124   GFRNONAA >60 03/16/2022 0448   GFRNONAA 59 (L) 01/29/2014 2124   GFRNONAA >60 12/22/2013 1159   GFRAA >60  04/25/2018 1713   GFRAA >60 01/29/2014 2124   GFRAA >60 12/22/2013 1159   CrCl cannot be calculated (Patient's most recent lab result is older than the maximum 21 days allowed.).  COAG Lab Results  Component Value Date   INR 1.0 11/15/2021   INR 1.1 12/31/2020    Radiology No results found.   Assessment/Plan 1. Bilateral carotid artery stenosis (Primary) Recommend:   Given the patient's asymptomatic subcritical stenosis no further invasive testing or surgery at this time.   Carotid Duplex done today shows RICA 1-39% (s/p right CEA) and known occlusion of the left ICA.  No significant change compared to last study in 10/13/2021   Continue antiplatelet therapy as prescribed Continue management of CAD, HTN and Hyperlipidemia Healthy heart diet,  encouraged exercise at least 4 times per week Follow up in 12 months with duplex ultrasound and physical exam    2. Atherosclerosis of native artery of right lower extremity with intermittent claudication (HCC) Recommend:  Patient should undergo arterial duplex of the lower extremity because there has been a significant deterioration in the patient's lower extremity symptoms.  The patient states they are having increased pain and a marked decrease in the distance that they can walk.  The risks and benefits as well as the alternatives were discussed in detail with the patient.  All questions were answered.  Patient agrees to proceed and understands this could be a prelude to angiography and intervention.  The patient will follow up with me in the office to review the studies.  - VAS US  ABI WITH/WO TBI; Future - VAS US  LOWER EXTREMITY ARTERIAL DUPLEX; Future  3. Aneurysm of descending thoracic aorta without rupture (HCC) Recommend:   No surgery or intervention is indicated at this time.   The patient has an asymptomatic thoracic aortic aneurysm that is less than 6.0 cm in maximal diameter.  I have discussed the natural history of  thoracic aortic aneurysm and the small risk of rupture for aneurysm less than 6.5 cm in size.  However, as these small aneurysms tend to enlarge over time, continued surveillance with CT scan is mandatory.    I have also discussed optimizing medical management with hypertension and lipid control and the importance of abstinence from tobacco.  The patient is also encouraged to exercise a minimum of 30 minutes 4 times a week.    Should the patient develop new onset chest or back pain or signs of peripheral embolization they are instructed to seek medical attention immediately and to alert the physician providing care that they have an aneurysm in the chest.    The patient voices their understanding.   The patient will return as ordered with a CT scan of the chest  4. Chronic venous insufficiency Recommend:   I have had a long discussion with the patient regarding swelling and why it  causes symptoms.  Patient will begin wearing graduated compression on a daily basis a prescription was given. The patient will  wear the stockings first thing in the morning and removing them in the evening. The patient is instructed specifically not to sleep in the stockings.    In addition, behavioral modification will be initiated.  This will include frequent elevation, use of over the counter pain medications and exercise such as walking.   Consideration for a lymph pump will also be made based upon the effectiveness of conservative therapy.  This would help to improve the edema control and prevent sequela such as ulcers and infections  5. Chronic obstructive pulmonary disease, unspecified COPD type (HCC) Continue pulmonary medications and aerosols as already ordered, these medications have been reviewed and there are no changes at this time.     [name], MD  11/16/2023 3:22 PM

## 2023-11-20 ENCOUNTER — Ambulatory Visit (INDEPENDENT_AMBULATORY_CARE_PROVIDER_SITE_OTHER): Admitting: Family Medicine

## 2023-11-20 ENCOUNTER — Encounter: Payer: Self-pay | Admitting: Family Medicine

## 2023-11-20 ENCOUNTER — Telehealth: Payer: Self-pay

## 2023-11-20 VITALS — BP 136/58 | HR 74 | Temp 98.2°F | Ht 62.0 in | Wt 131.0 lb

## 2023-11-20 DIAGNOSIS — Z6823 Body mass index (BMI) 23.0-23.9, adult: Secondary | ICD-10-CM

## 2023-11-20 DIAGNOSIS — J449 Chronic obstructive pulmonary disease, unspecified: Secondary | ICD-10-CM

## 2023-11-20 DIAGNOSIS — F17210 Nicotine dependence, cigarettes, uncomplicated: Secondary | ICD-10-CM

## 2023-11-20 DIAGNOSIS — F4321 Adjustment disorder with depressed mood: Secondary | ICD-10-CM

## 2023-11-20 DIAGNOSIS — Z1159 Encounter for screening for other viral diseases: Secondary | ICD-10-CM

## 2023-11-20 DIAGNOSIS — F419 Anxiety disorder, unspecified: Secondary | ICD-10-CM

## 2023-11-20 DIAGNOSIS — H9190 Unspecified hearing loss, unspecified ear: Secondary | ICD-10-CM

## 2023-11-20 DIAGNOSIS — I10 Essential (primary) hypertension: Secondary | ICD-10-CM

## 2023-11-20 DIAGNOSIS — N3946 Mixed incontinence: Secondary | ICD-10-CM

## 2023-11-20 DIAGNOSIS — I25119 Atherosclerotic heart disease of native coronary artery with unspecified angina pectoris: Secondary | ICD-10-CM | POA: Diagnosis not present

## 2023-11-20 DIAGNOSIS — F32A Depression, unspecified: Secondary | ICD-10-CM

## 2023-11-20 DIAGNOSIS — I739 Peripheral vascular disease, unspecified: Secondary | ICD-10-CM

## 2023-11-20 MED ORDER — AMLODIPINE BESYLATE 5 MG PO TABS: 5.0000 mg | ORAL_TABLET | Freq: Every day | ORAL | 5 refills | Status: AC

## 2023-11-20 NOTE — Telephone Encounter (Unsigned)
 Copied from CRM [id number]. Topic: Referral - Status >> Nov 20, 2023  1:29 PM Emylou G wrote: Reason for CRM: Patient called  said [facility].. they don't take anybody over 65..Is there an alternative?

## 2023-11-20 NOTE — Progress Notes (Signed)
 Established Patient Office Visit  Introduced to nurse practitioner role and practice setting.  All questions answered.  Discussed provider/patient relationship and expectations.   Subjective   Patient ID: Molly Matthews, female    DOB: 1950-06-04  Age: 73 y.o. MRN: [id number]  Chief Complaint  Patient presents with   Hypertension    Hypertension Patient is here for follow-up of elevated blood pressure. She is not exercising and is adherent to a low-salt diet.  Better eating habits hardly every eats fried foods.  Blood pressure is well controlled at home. Sometimes it is good and it ranges from 130 -140.Cardiac symptoms: fatigue and near-syncope.    Follow-up    Mood still upset regarding her 89 year old grandson that accidentally shot himself, she stated that she could not speak to with anyone because her insurance will not cover it.  Stated that she is very depressed.    Discussed the use of AI scribe software for clinical note transcription with the patient, who gave verbal consent to proceed.  History of Present Illness Molly Matthews is a 73 year old female with hypertension and coronary artery disease who presents for a follow-up visit.  Her blood pressure is currently controlled with amlodipine  5 mg daily, which she is taking. Previously, her blood pressure was elevated at 170 mmHg, but it is now improved. She is also taking Lexapro  30 mg daily for mood, which she takes every morning, and has stopped Klonopin, noting some improvement in her symptoms.  She experiences significant emotional distress related to the loss of a loved one, expressing feelings of sadness and crying daily. She describes a coping mechanism of repetitive hand movements and feels nervous. She takes tizanidine at night, which helps her sleep well, but she continues to feel nervous and experiences crying spells.  She has a history of coronary artery disease and is on Repatha and Plavix  75 mg. She sees cardiology  annually for her carotid artery and has an upcoming imaging appointment for her legs on August 18th. She reports difficulty with her left leg, describing pain and difficulty getting out of the car. Two years ago, she was told there was a 50% blockage.  She has stopped using an estrogen cream due to severe nipple irritation, which made her nipples 'so hard and so sore' that she could not wear a bra. She is seeking a referral to a urogynecologist closer to her location, as she finds traveling to [place] too far and prefers locations like [place] or [place].  She has chronic obstructive pulmonary disease (COPD) and uses albuterol  as needed and Anoro Ellipta daily. She also has gastroesophageal reflux disease (GERD) and takes Protonix  40 mg daily.  She is a chronic tobacco user and is hard of hearing; she has hearing aids but does not wear them.         11/20/2023   11:07 AM 10/26/2023   10:23 AM  Depression screen PHQ 2/9  Decreased Interest 2 2  Down, Depressed, Hopeless 2 2  PHQ - 2 Score 4 4  Altered sleeping 0 0  Tired, decreased energy 2 2  Change in appetite 0 0  Feeling bad or failure about yourself  2 2  Trouble concentrating 3 2  Moving slowly or fidgety/restless 1 2  Suicidal thoughts 0 0  PHQ-9 Score 12 12  Difficult doing work/chores Somewhat difficult Somewhat difficult       11/20/2023   11:08 AM 10/26/2023   10:23 AM  GAD  7 : Generalized Anxiety Score  Nervous, Anxious, on Edge 3 2  Control/stop worrying 3 2  Worry too much - different things 3 2  Trouble relaxing 3 2  Restless 2 2  Easily annoyed or irritable 2 2  Afraid - awful might happen 3 3  Total GAD 7 Score 19 15  Anxiety Difficulty Very difficult Somewhat difficult     Review of Systems  All other systems reviewed and are negative.   Negative unless indicated in HPI   Objective:     BP (!) 136/58 (BP Location: Left Arm, Patient Position: Sitting, Cuff Size: Normal)   Pulse 74   Temp 98.2  F (36.8 C) (Oral)   Ht 5' 2 (1.575 m)   Wt 131 lb (59.4 kg)   SpO2 95%   BMI 23.96 kg/m    Physical Exam Constitutional:      General: She is not in acute distress.    Appearance: Normal appearance. She is normal weight. She is not toxic-appearing or diaphoretic.  HENT:     Head: Normocephalic.     Nose: Nose normal.     Mouth/Throat:     Mouth: Mucous membranes are moist.     Pharynx: Oropharynx is clear.  Eyes:     Extraocular Movements: Extraocular movements intact.     Pupils: Pupils are equal, round, and reactive to light.  Cardiovascular:     Rate and Rhythm: Normal rate and regular rhythm.     Pulses:          Radial pulses are 2+ on the right side and 2+ on the left side.       Dorsalis pedis pulses are 2+ on the right side and 1+ on the left side.       Posterior tibial pulses are 2+ on the right side and 1+ on the left side.     Heart sounds: Normal heart sounds. No murmur heard.    No friction rub. No gallop.  Pulmonary:     Effort: No respiratory distress.     Breath sounds: No stridor. No wheezing, rhonchi or rales.  Chest:     Chest wall: No tenderness.  Musculoskeletal:     Right lower leg: No edema.     Left lower leg: No edema.  Skin:    General: Skin is warm and dry.     Capillary Refill: Capillary refill takes less than 2 seconds.  Neurological:     General: No focal deficit present.     Mental Status: She is alert and oriented to person, place, and time. Mental status is at baseline.  Psychiatric:        Mood and Affect: Mood is anxious. Affect is tearful.        Behavior: Behavior normal. Behavior is cooperative.        Thought Content: Thought content normal.        Judgment: Judgment normal.    No results found for any visits on 11/20/23.    The ASCVD Risk score (Arnett DK, et al., 2019) failed to calculate for the following reasons:   Risk score cannot be calculated because patient has a medical history suggesting prior/existing  ASCVD    Assessment & Plan:  Primary hypertension -     Comprehensive metabolic panel with GFR -     amLODIPine  Besylate; Take 1 tablet (5 mg total) by mouth daily.  Dispense: 30 tablet; Refill: 5  Coronary artery disease involving native coronary artery of  native heart with angina pectoris (HCC) -     Lipid panel  Screening for viral disease -     Hepatitis C antibody  BMI 23.0-23.9, adult -     CBC  Mixed stress and urge urinary incontinence -     Ambulatory referral to Urogynecology  Hard of hearing  Chronic obstructive pulmonary disease, unspecified COPD type (HCC)  Cigarette nicotine  dependence without complication  Peripheral artery disease (HCC)  Anxiety and depression  Grief at loss of child     Assessment and Plan Assessment & Plan Mixed Urinary Incontinence Intermittent mixed stress and urge incontinence. Estrogen cream discontinued due to nipple irritation. Prefers local urogynecology referral. - Place new referral for local urogynecology placed.  Anxiety and Depression Ongoing anxiety and depression, exacerbated by grief, loss of grandson due to accident. Some improvement on escitalopram  30 mg daily. Stopped Klonopin, no additional medication needed at this time per pt. - Continue escitalopram  30 mg daily. - Referral to [facility] at [facility] for therapy. - Provide contact information for behavioral health referral.  Peripheral Artery Disease Worsening pain in L; Experiencing pain and mobility difficulty - pt seeing vascular. Imaging scheduled for August 18th to assess status.   Coronary Artery Disease Managed with Repatha and Plavix  75 mg. Regular cardiology follow-ups in place. - Continue Repatha and Plavix  75 mg.  Hypertension Well-controlled with amlodipine  5 mg daily. Previously elevated as was out of medication, now stable. - Continue amlodipine  5 mg daily. - GOAL<130/80 - Tobacco cessation encouraged - will check CMP today -  limited sodium, fatty/processed foods.  Chronic Obstructive Pulmonary Disease (COPD) Well-managed. Uses albuterol  as needed. - Continue albuterol  as needed. - continue Trelegy Ellipta  Chronic Tobacco Use Chronic tobacco use. Advised to decrease use for health improvement. - Recommend decreasing tobacco use.  General Health Maintenance Requires routine lab work. Hard of hearing, advised to wear hearing aids during visits. - Order labs including hepatitis C screening, lipid panel, CBC, and CMP. - Advise to wear hearing aids during medical visits.  Hard of Hearing - Patient had hearing aids, does not wear - Advised to please wear during visits so pt is aware of medical plan.   Return in about 3 months (around 02/20/2024) for chronic disease mgmt.   I, Curtis DELENA Boom, FNP, have reviewed all documentation for this visit. The documentation on 11/20/23 for the exam, diagnosis, procedures, and orders are all accurate and complete.   Curtis DELENA Boom, FNP

## 2023-11-21 ENCOUNTER — Other Ambulatory Visit: Payer: Self-pay | Admitting: Family Medicine

## 2023-11-21 DIAGNOSIS — F4321 Adjustment disorder with depressed mood: Secondary | ICD-10-CM

## 2023-11-21 DIAGNOSIS — F322 Major depressive disorder, single episode, severe without psychotic features: Secondary | ICD-10-CM

## 2023-11-21 NOTE — Telephone Encounter (Signed)
 Will referral to new place.

## 2023-11-23 ENCOUNTER — Ambulatory Visit: Payer: Self-pay | Admitting: Family Medicine

## 2023-11-23 LAB — CBC
Hematocrit: 49.3 % — ABNORMAL HIGH (ref 34.0–46.6)
Hemoglobin: 16.5 g/dL — ABNORMAL HIGH (ref 11.1–15.9)
MCH: 32.2 pg (ref 26.6–33.0)
MCHC: 33.5 g/dL (ref 31.5–35.7)
MCV: 96 fL (ref 79–97)
Platelets: 260 x10E3/uL (ref 150–450)
RBC: 5.13 x10E6/uL (ref 3.77–5.28)
RDW: 14.4 % (ref 11.7–15.4)
WBC: 8.4 x10E3/uL (ref 3.4–10.8)

## 2023-11-23 LAB — COMPREHENSIVE METABOLIC PANEL WITH GFR
ALT: 12 IU/L (ref 0–32)
AST: 17 IU/L (ref 0–40)
Albumin: 4.5 g/dL (ref 3.8–4.8)
Alkaline Phosphatase: 79 IU/L (ref 44–121)
BUN/Creatinine Ratio: 14 (ref 12–28)
BUN: 10 mg/dL (ref 8–27)
Bilirubin Total: 0.8 mg/dL (ref 0.0–1.2)
CO2: 21 mmol/L (ref 20–29)
Calcium: 9.9 mg/dL (ref 8.7–10.3)
Chloride: 106 mmol/L (ref 96–106)
Creatinine, Ser: 0.74 mg/dL (ref 0.57–1.00)
Globulin, Total: 2.7 g/dL (ref 1.5–4.5)
Glucose: 109 mg/dL — ABNORMAL HIGH (ref 70–99)
Potassium: 4.1 mmol/L (ref 3.5–5.2)
Sodium: 145 mmol/L — ABNORMAL HIGH (ref 134–144)
Total Protein: 7.2 g/dL (ref 6.0–8.5)
eGFR: 84 mL/min/1.73 (ref >59)

## 2023-11-23 LAB — LIPID PANEL
Chol/HDL Ratio: 2.2 ratio (ref 0.0–4.4)
Cholesterol, Total: 101 mg/dL (ref 100–199)
HDL: 45 mg/dL (ref >39)
LDL Chol Calc (NIH): 36 mg/dL (ref 0–99)
Triglycerides: 109 mg/dL (ref 0–149)
VLDL Cholesterol Cal: 20 mg/dL (ref 5–40)

## 2023-11-23 LAB — HEPATITIS C ANTIBODY: Hep C Virus Ab: NONREACTIVE

## 2023-12-17 ENCOUNTER — Encounter (INDEPENDENT_AMBULATORY_CARE_PROVIDER_SITE_OTHER): Payer: Self-pay | Admitting: Vascular Surgery

## 2023-12-17 ENCOUNTER — Ambulatory Visit (INDEPENDENT_AMBULATORY_CARE_PROVIDER_SITE_OTHER)

## 2023-12-17 ENCOUNTER — Ambulatory Visit (INDEPENDENT_AMBULATORY_CARE_PROVIDER_SITE_OTHER): Admitting: Vascular Surgery

## 2023-12-17 VITALS — BP 138/55 | HR 67 | Ht 62.0 in | Wt 134.6 lb

## 2023-12-17 DIAGNOSIS — I70211 Atherosclerosis of native arteries of extremities with intermittent claudication, right leg: Secondary | ICD-10-CM

## 2023-12-17 DIAGNOSIS — I872 Venous insufficiency (chronic) (peripheral): Secondary | ICD-10-CM

## 2023-12-17 DIAGNOSIS — I25119 Atherosclerotic heart disease of native coronary artery with unspecified angina pectoris: Secondary | ICD-10-CM

## 2023-12-17 DIAGNOSIS — I6522 Occlusion and stenosis of left carotid artery: Secondary | ICD-10-CM | POA: Diagnosis not present

## 2023-12-17 DIAGNOSIS — I7123 Aneurysm of the descending thoracic aorta, without rupture: Secondary | ICD-10-CM | POA: Diagnosis not present

## 2023-12-17 LAB — VAS US ABI WITH/WO TBI
Left ABI: 0.91
Right ABI: 0.81

## 2023-12-17 NOTE — Progress Notes (Unsigned)
 MRN : [id number]  Molly Matthews is a 73 y.o. (February 20, 1951) female who presents with chief complaint of check circulation.  History of Present Illness:  The patient is seen for follow up evaluation of carotid stenosis. The carotid stenosis followed by ultrasound.    The patient denies amaurosis fugax. There is no recent history of TIA symptoms or focal motor deficits. There is no prior documented CVA.   The patient is taking enteric-coated aspirin  81 mg daily.   There is no history of migraine headaches. There is no history of seizures.   The patient is also here for followup and review of noninvasive studies of the lower extremities. There have been no interval changes in lower extremity symptoms. No interval shortening of the patient's claudication distance or development of rest pain symptoms. However, she does have occasional episodes of numbness in her right foot which gets better when she stands.  No new ulcers or wounds have occurred since the last visit.   There has been a significant changes to the patient's overall health care.  At today's appointment she is also complaining of a significant increase in her leg pain and a marked decrease in her claudication distance.  She is also describing mild rest pain symptoms on the right compared to the left.  She denies dangling her foot off the side of the bed while trying to sleep.  There have been no new open wounds or sores.  The patient has no had any past angiography, interventions or vascular surgery.     Patient is also followed for leg swelling. The patient first noticed the swelling remotely. The swelling is associated with pain and discoloration. The pain and swelling worsens with prolonged dependency and improves with elevation. The pain is unrelated to activity.  The patient notes that in the morning the legs are significantly improved but they steadily worsened  throughout the course of the day. The patient also notes a steady worsening of the discoloration in the ankle and shin area.  Overall she states this particular issue has been stable compared to her previous visit.   Elevation makes the leg symptoms better, dependency makes them much worse. There is no history of ulcerations. The patient denies any recent changes in medications.   The patient has not been wearing graduated compression.   The patient denies a history of DVT or PE. There is no prior history of phlebitis. There is no history of primary lymphedema.   No history of malignancies. No history of trauma or groin or pelvic surgery. There is no history of radiation treatment to the groin or pelvis   The patient has a history of coronary artery disease, no recent episodes of angina or shortness of breath. There is a history of hyperlipidemia which is being treated with a statin.     Carotid Duplex done 11/15/2023 shows RICA 40-59% (s/p right CEA) and known occlusion of the left ICA.  No significant change compared to last study in 10/13/2021   Previous ABI's Rt=1.00 (biphasic) and Lt=1.11 (triphasic) (previous ABI Rt=0.98 and Lt=1.15).   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    Past Medical History:  Diagnosis Date   Anginal pain (HCC)    Anxiety    Aortic atherosclerosis (HCC)    Arthritis    Cancer (HCC) 01/2017   Skin cancer on right leg   Carotid stenosis, left    100 % blockage   Cholelithiasis    Chronic enteritis    Chronic venous insufficiency    Colon polyps    COPD (chronic obstructive pulmonary disease) (HCC)    mild   Coronary artery disease    Depression    Diastasis recti     Diverticulosis    Dyspnea    Erosive esophagitis    Fatty liver disease, nonalcoholic    Gastritis    GERD (gastroesophageal reflux disease)    Headache    Herniated lumbar intervertebral disc    Hyperlipidemia    Hypertension    IBS (irritable bowel syndrome)    Left carotid artery stenosis    Osteopenia    Peptic ulcer disease    Personal history of tobacco use, presenting hazards to health 03/14/2016   SCC (squamous cell carcinoma)    Stroke (HCC)    TIA's X 2   Tobacco dependence    Vitamin D deficiency     Past Surgical History:  Procedure Laterality Date   ABDOMINAL HYSTERECTOMY     BREAST BIOPSY Left    negative 2008   CAROTID ENDARTERECTOMY     CHOLECYSTECTOMY     COLONOSCOPY W/ POLYPECTOMY     COLONOSCOPY WITH PROPOFOL  N/A 07/26/2020   Procedure: COLONOSCOPY WITH PROPOFOL ;  Surgeon: Toledo, Ladell POUR, MD;  Location: [facility];  Service: Gastroenterology;  Laterality: N/A;   DILATION AND CURETTAGE OF UTERUS     ESOPHAGOGASTRODUODENOSCOPY (EGD) WITH PROPOFOL  N/A 01/26/2017   Procedure: ESOPHAGOGASTRODUODENOSCOPY (EGD) WITH PROPOFOL ;  Surgeon: Toledo, Ladell POUR, MD;  Location: [facility];  Service: Gastroenterology;  Laterality: N/A;   ESOPHAGOGASTRODUODENOSCOPY (EGD) WITH PROPOFOL  N/A 07/26/2020   Procedure: ESOPHAGOGASTRODUODENOSCOPY (EGD) WITH PROPOFOL ;  Surgeon: Toledo, Ladell POUR, MD;  Location: [facility];  Service: Gastroenterology;  Laterality: N/A;   HEMORRHOID SURGERY N/A 07/25/2016   Procedure: HEMORRHOIDECTOMY INTERNAL AND EXTERNAL;  Surgeon: Larinda Unknown Sharps, MD;  Location: [facility];  Service: General;  Laterality: N/A;   MICROLARYNGOSCOPY N/A 01/29/2019   Procedure: MICROLARYNGOSCOPY with Vocal Cord polyp removal.   No laser is needed.;  Surgeon: Blair Mt, MD;  Location: [facility];  Service: ENT;  Laterality: N/A;   VASCULAR SURGERY Right    Carotid Endarterectomy     Social History Social History   Tobacco Use   Smoking status: Every  Day    Current packs/day: 1.00    Average packs/day: 1 pack/day for 45.0 years (45.0 ttl pk-yrs)    Types: Cigarettes    Passive exposure: Never   Smokeless tobacco: Never  Vaping Use   Vaping status: Former  Substance Use Topics   Alcohol use: No   Drug use: No    Family History Family History  Problem Relation Age of Onset   Breast cancer Mother 80   Atrial fibrillation Mother    Breast cancer Sister 38   Heart attack Father    Colon cancer Brother  Lung cancer Brother     Allergies  Allergen Reactions   Buspirone     Other reaction(s): Headache   Chantix [Varenicline Tartrate] Other (See Comments)    Jittery muscle movements   Statins Other (See Comments)    Muscle and joint aches - can take in small doses   Estradiol  Itching   Sulfa Antibiotics     Other reaction(s): Unknown   Zithromax [Azithromycin] Other (See Comments)    Back Pain   Elemental Sulfur Rash     REVIEW OF SYSTEMS (Negative unless checked)  Constitutional: [] Weight loss  [] Fever  [] Chills Cardiac: [] Chest pain   [] Chest pressure   [] Palpitations   [] Shortness of breath when laying flat   [] Shortness of breath with exertion. Vascular:  [x] Pain in legs with walking   [] Pain in legs at rest  [] History of DVT   [] Phlebitis   [] Swelling in legs   [] Varicose veins   [] Non-healing ulcers Pulmonary:   [] Uses home oxygen   [] Productive cough   [] Hemoptysis   [] Wheeze  [] COPD   [] Asthma Neurologic:  [] Dizziness   [] Seizures   [] History of stroke   [] History of TIA  [] Aphasia   [] Vissual changes   [] Weakness or numbness in arm   [] Weakness or numbness in leg Musculoskeletal:   [] Joint swelling   [] Joint pain   [] Low back pain Hematologic:  [] Easy bruising  [] Easy bleeding   [] Hypercoagulable state   [] Anemic Gastrointestinal:  [] Diarrhea   [] Vomiting  [] Gastroesophageal reflux/heartburn   [] Difficulty swallowing. Genitourinary:  [] Chronic kidney disease   [] Difficult urination  [] Frequent urination    [] Blood in urine Skin:  [] Rashes   [] Ulcers  Psychological:  [] History of anxiety   []  History of major depression.  Physical Examination  There were no vitals filed for this visit. There is no height or weight on file to calculate BMI. Gen: WD/WN, NAD Head: [place]/AT, No temporalis wasting.  Ear/Nose/Throat: Hearing grossly intact, nares w/o erythema or drainage Eyes: PER, EOMI, sclera nonicteric.  Neck: Supple, no masses.  No bruit or JVD.  Pulmonary:  Good air movement, no audible wheezing, no use of accessory muscles.  Cardiac: RRR, normal S1, S2, no Murmurs. Vascular:  mild trophic changes, no open wounds Vessel Right Left  Radial Palpable Palpable  PT Not Palpable Not Palpable  DP Not Palpable Not Palpable  Gastrointestinal: soft, non-distended. No guarding/no peritoneal signs.  Musculoskeletal: M/S 5/5 throughout.  No visible deformity.  Neurologic: CN 2-12 intact. Pain and light touch intact in extremities.  Symmetrical.  Speech is fluent. Motor exam as listed above. Psychiatric: Judgment intact, Mood & affect appropriate for pt's clinical situation. Dermatologic: No rashes or ulcers noted.  No changes consistent with cellulitis.   CBC Lab Results  Component Value Date   WBC 8.4 11/22/2023   HGB 16.5 (H) 11/22/2023   HCT 49.3 (H) 11/22/2023   MCV 96 11/22/2023   PLT 260 11/22/2023    BMET    Component Value Date/Time   NA 145 (H) 11/22/2023 0819   NA 142 01/29/2014 2124   K 4.1 11/22/2023 0819   K 4.0 01/29/2014 2124   CL 106 11/22/2023 0819   CL 110 (H) 01/29/2014 2124   CO2 21 11/22/2023 0819   CO2 25 01/29/2014 2124   GLUCOSE 109 (H) 11/22/2023 0819   GLUCOSE 183 (H) 03/16/2022 0448   GLUCOSE 137 (H) 01/29/2014 2124   BUN 10 11/22/2023 0819   BUN 10 01/29/2014 2124   CREATININE 0.74 11/22/2023  9180   CREATININE 1.00 01/29/2014 2124   CALCIUM  9.9 11/22/2023 0819   CALCIUM  9.3 01/29/2014 2124   GFRNONAA >60 03/16/2022 0448   GFRNONAA 59 (L) 01/29/2014  2124   GFRNONAA >60 12/22/2013 1159   GFRAA >60 04/25/2018 1713   GFRAA >60 01/29/2014 2124   GFRAA >60 12/22/2013 1159   CrCl cannot be calculated (Patient's most recent lab result is older than the maximum 21 days allowed.).  COAG Lab Results  Component Value Date   INR 1.0 11/15/2021   INR 1.1 12/31/2020    Radiology No results found.   Assessment/Plan There are no diagnoses linked to this encounter.   [name], MD  12/17/2023 1:00 PM

## 2023-12-21 ENCOUNTER — Encounter (INDEPENDENT_AMBULATORY_CARE_PROVIDER_SITE_OTHER): Payer: Self-pay | Admitting: Vascular Surgery

## 2023-12-28 ENCOUNTER — Ambulatory Visit: Admitting: Obstetrics and Gynecology

## 2024-01-02 ENCOUNTER — Telehealth (INDEPENDENT_AMBULATORY_CARE_PROVIDER_SITE_OTHER): Payer: Self-pay

## 2024-01-02 NOTE — Telephone Encounter (Signed)
 Spoke with the patient and she is scheduled with Dr. Jama for a LLE angio w/ intervention on 01/22/24 with a 6:45 am arrival time to the [facility]. Pre-procedure instructions were discussed and will be sent to Mychart and mailed.

## 2024-01-22 ENCOUNTER — Encounter
Admission: RE | Disposition: A | Discharge status: Home / Self Care | Payer: Self-pay | Source: Home / Self Care | Attending: Vascular Surgery

## 2024-01-22 ENCOUNTER — Other Ambulatory Visit: Payer: Self-pay

## 2024-01-22 ENCOUNTER — Encounter: Payer: Self-pay | Admitting: Vascular Surgery

## 2024-01-22 ENCOUNTER — Ambulatory Visit
Admission: RE | Admit: 2024-01-22 | Discharge: 2024-01-22 | Disposition: A | Discharge status: Home / Self Care | Attending: Vascular Surgery | Admitting: Vascular Surgery

## 2024-01-22 DIAGNOSIS — I6522 Occlusion and stenosis of left carotid artery: Secondary | ICD-10-CM | POA: Insufficient documentation

## 2024-01-22 DIAGNOSIS — I70222 Atherosclerosis of native arteries of extremities with rest pain, left leg: Secondary | ICD-10-CM

## 2024-01-22 DIAGNOSIS — I7123 Aneurysm of the descending thoracic aorta, without rupture: Secondary | ICD-10-CM | POA: Diagnosis not present

## 2024-01-22 DIAGNOSIS — I70219 Atherosclerosis of native arteries of extremities with intermittent claudication, unspecified extremity: Secondary | ICD-10-CM | POA: Diagnosis present

## 2024-01-22 DIAGNOSIS — I25119 Atherosclerotic heart disease of native coronary artery with unspecified angina pectoris: Secondary | ICD-10-CM | POA: Diagnosis not present

## 2024-01-22 DIAGNOSIS — I872 Venous insufficiency (chronic) (peripheral): Secondary | ICD-10-CM | POA: Insufficient documentation

## 2024-01-22 DIAGNOSIS — E785 Hyperlipidemia, unspecified: Secondary | ICD-10-CM | POA: Insufficient documentation

## 2024-01-22 DIAGNOSIS — F17210 Nicotine dependence, cigarettes, uncomplicated: Secondary | ICD-10-CM | POA: Insufficient documentation

## 2024-01-22 DIAGNOSIS — I10 Essential (primary) hypertension: Secondary | ICD-10-CM | POA: Diagnosis not present

## 2024-01-22 DIAGNOSIS — I70201 Unspecified atherosclerosis of native arteries of extremities, right leg: Secondary | ICD-10-CM

## 2024-01-22 DIAGNOSIS — Z7982 Long term (current) use of aspirin: Secondary | ICD-10-CM | POA: Insufficient documentation

## 2024-01-22 DIAGNOSIS — I700 Atherosclerosis of aorta: Secondary | ICD-10-CM

## 2024-01-22 HISTORY — PX: LOWER EXTREMITY ANGIOGRAPHY: CATH118251

## 2024-01-22 HISTORY — PX: LOWER EXTREMITY INTERVENTION: CATH118252

## 2024-01-22 LAB — CREATININE, SERUM
Creatinine, Ser: 0.74 mg/dL (ref 0.44–1.00)
GFR, Estimated: >60 mL/min (ref >60)

## 2024-01-22 LAB — BUN: BUN: 16 mg/dL (ref 8–23)

## 2024-01-22 SURGERY — LOWER EXTREMITY INTERVENTION
Anesthesia: Moderate Sedation | Site: Leg Lower | Laterality: Left

## 2024-01-22 MED ORDER — ONDANSETRON HCL 4 MG/2ML IJ SOLN: 4.0000 mg | Freq: Four times a day (QID) | INTRAMUSCULAR | Status: DC | PRN

## 2024-01-22 MED ORDER — ASPIRIN 325 MG PO TBEC
DELAYED_RELEASE_TABLET | ORAL | Status: AC
  Filled 2024-01-22: qty 1

## 2024-01-22 MED ORDER — ACETAMINOPHEN 325 MG PO TABS: 650.0000 mg | ORAL_TABLET | ORAL | Status: DC | PRN

## 2024-01-22 MED ORDER — METHYLPREDNISOLONE SODIUM SUCC 125 MG IJ SOLR: 125.0000 mg | Freq: Once | INTRAMUSCULAR | Status: DC | PRN

## 2024-01-22 MED ORDER — CEFAZOLIN SODIUM-DEXTROSE 2-4 GM/100ML-% IV SOLN
2.0000 g | INTRAVENOUS | Status: AC
  Administered 2024-01-22: 2 g via INTRAVENOUS

## 2024-01-22 MED ORDER — SODIUM CHLORIDE 0.9 % IV SOLN: 250.0000 mL | INTRAVENOUS | Status: DC | PRN

## 2024-01-22 MED ORDER — MIDAZOLAM HCL 2 MG/2ML IJ SOLN
INTRAMUSCULAR | Status: DC | PRN
  Administered 2024-01-22: 2 mg via INTRAVENOUS
  Administered 2024-01-22: 1 mg via INTRAVENOUS

## 2024-01-22 MED ORDER — SODIUM CHLORIDE 0.9% FLUSH: 3.0000 mL | INTRAVENOUS | Status: DC | PRN

## 2024-01-22 MED ORDER — SODIUM CHLORIDE 0.9 % IV SOLN: INTRAVENOUS | Status: DC

## 2024-01-22 MED ORDER — HEPARIN SODIUM (PORCINE) 1000 UNIT/ML IJ SOLN
INTRAMUSCULAR | Status: DC | PRN
  Administered 2024-01-22: 6000 [IU] via INTRAVENOUS

## 2024-01-22 MED ORDER — SODIUM CHLORIDE 0.9% FLUSH: 3.0000 mL | Freq: Two times a day (BID) | INTRAVENOUS | Status: DC

## 2024-01-22 MED ORDER — HYDROMORPHONE HCL 1 MG/ML IJ SOLN
INTRAMUSCULAR | Status: AC
  Filled 2024-01-22: qty 0.5

## 2024-01-22 MED ORDER — ATORVASTATIN CALCIUM 10 MG PO TABS: 10.0000 mg | ORAL_TABLET | Freq: Every day | ORAL | 3 refills | Status: DC

## 2024-01-22 MED ORDER — ASPIRIN 81 MG PO CHEW
CHEWABLE_TABLET | ORAL | Status: AC
  Filled 2024-01-22: qty 4

## 2024-01-22 MED ORDER — OXYCODONE HCL 5 MG PO TABS: 5.0000 mg | ORAL_TABLET | ORAL | Status: DC | PRN

## 2024-01-22 MED ORDER — FENTANYL CITRATE (PF) 100 MCG/2ML IJ SOLN
INTRAMUSCULAR | Status: AC
  Filled 2024-01-22: qty 2

## 2024-01-22 MED ORDER — MIDAZOLAM HCL 5 MG/5ML IJ SOLN
INTRAMUSCULAR | Status: AC
Start: 2024-01-22 — End: 2024-01-22
  Filled 2024-01-22: qty 5

## 2024-01-22 MED ORDER — MIDAZOLAM HCL 2 MG/ML PO SYRP: 8.0000 mg | ORAL_SOLUTION | Freq: Once | ORAL | Status: DC | PRN

## 2024-01-22 MED ORDER — IODIXANOL 320 MG/ML IV SOLN
INTRAVENOUS | Status: DC | PRN
  Administered 2024-01-22: 70 mL via INTRA_ARTERIAL

## 2024-01-22 MED ORDER — ASPIRIN 81 MG PO TBEC: 81.0000 mg | DELAYED_RELEASE_TABLET | Freq: Every day | ORAL | 1 refills | Status: AC

## 2024-01-22 MED ORDER — CEFAZOLIN SODIUM-DEXTROSE 2-4 GM/100ML-% IV SOLN
INTRAVENOUS | Status: AC
  Filled 2024-01-22: qty 100

## 2024-01-22 MED ORDER — HEPARIN (PORCINE) IN NACL 1000-0.9 UT/500ML-% IV SOLN
INTRAVENOUS | Status: DC | PRN
  Administered 2024-01-22: 1000 mL

## 2024-01-22 MED ORDER — HYDROMORPHONE HCL 1 MG/ML IJ SOLN
1.0000 mg | Freq: Once | INTRAMUSCULAR | Status: AC | PRN
  Administered 2024-01-22 (×2): 0.5 mg via INTRAVENOUS

## 2024-01-22 MED ORDER — LIDOCAINE HCL (PF) 1 % IJ SOLN
INTRAMUSCULAR | Status: DC | PRN
  Administered 2024-01-22 (×2): 10 mL via INTRADERMAL

## 2024-01-22 MED ORDER — ASPIRIN 325 MG PO TABS
325.0000 mg | ORAL_TABLET | ORAL | Status: AC
  Administered 2024-01-22: 325 mg via ORAL
  Filled 2024-01-22: qty 1

## 2024-01-22 MED ORDER — FAMOTIDINE 20 MG PO TABS: 40.0000 mg | ORAL_TABLET | Freq: Once | ORAL | Status: DC | PRN

## 2024-01-22 MED ORDER — HEPARIN SODIUM (PORCINE) 1000 UNIT/ML IJ SOLN
INTRAMUSCULAR | Status: AC
  Filled 2024-01-22: qty 10

## 2024-01-22 MED ORDER — MORPHINE SULFATE (PF) 2 MG/ML IV SOLN: 2.0000 mg | INTRAVENOUS | Status: DC | PRN

## 2024-01-22 MED ORDER — DIPHENHYDRAMINE HCL 50 MG/ML IJ SOLN: 50.0000 mg | Freq: Once | INTRAMUSCULAR | Status: DC | PRN

## 2024-01-22 MED ORDER — LABETALOL HCL 5 MG/ML IV SOLN: 10.0000 mg | INTRAVENOUS | Status: DC | PRN

## 2024-01-22 MED ORDER — HYDRALAZINE HCL 20 MG/ML IJ SOLN: 5.0000 mg | INTRAMUSCULAR | Status: DC | PRN

## 2024-01-22 MED ORDER — FENTANYL CITRATE (PF) 100 MCG/2ML IJ SOLN
INTRAMUSCULAR | Status: DC | PRN
  Administered 2024-01-22 (×2): 50 ug via INTRAVENOUS

## 2024-01-22 SURGICAL SUPPLY — 21 items
BALLOON LUTONIX DCB 4X40X130 (BALLOONS) IMPLANT
BALLOON LUTONIX DCB 4X60X130 (BALLOONS) IMPLANT
CATH ANGIO 5F PIGTAIL 65CM (CATHETERS) IMPLANT
COVER PROBE ULTRASOUND 5X96 (MISCELLANEOUS) IMPLANT
DEVICE PRESTO INFLATION (MISCELLANEOUS) IMPLANT
DEVICE STARCLOSE SE CLOSURE (Vascular Products) IMPLANT
GLIDEWIRE ADV .035X260CM (WIRE) IMPLANT
GOWN STRL REUS W/ TWL LRG LVL3 (GOWN DISPOSABLE) ×1 IMPLANT
INTRODUCER 7FR 23CM (INTRODUCER) IMPLANT
NDL ENTRY 21GA 7CM ECHOTIP (NEEDLE) IMPLANT
NEEDLE ENTRY 21GA 7CM ECHOTIP (NEEDLE) ×1 IMPLANT
PACK ANGIOGRAPHY (CUSTOM PROCEDURE TRAY) ×1 IMPLANT
SET INTRO CAPELLA COAXIAL (SET/KITS/TRAYS/PACK) IMPLANT
SHEATH ANL2 6FRX45 HC (SHEATH) IMPLANT
SHEATH BRITE TIP 5FRX11 (SHEATH) IMPLANT
STENT LIFESTENT 5F 5X40X135 (Permanent Stent) IMPLANT
STENT LIFESTREAM 8X37X80 (Permanent Stent) IMPLANT
SYR MEDRAD MARK 7 150ML (SYRINGE) IMPLANT
TUBING CONTRAST HIGH PRESS 72 (TUBING) IMPLANT
WIRE J 3MM .035X145CM (WIRE) IMPLANT
WIRE SUPRACORE 300CM (WIRE) IMPLANT

## 2024-01-22 NOTE — Progress Notes (Signed)
 MRN : [id number]  Molly Matthews is a 73 y.o. (05/17/1950) female who presents with chief complaint of check circulation.  History of Present Illness:   Patient presents to [facility] today for treatment of her left lower extremity atherosclerotic occlusive disease.  She was last seen in the office December 17, 2023.     The patient denies amaurosis fugax. There is no recent history of TIA symptoms or focal motor deficits. There is no prior documented CVA.   The patient is taking enteric-coated aspirin  81 mg daily.   There is no history of migraine headaches. There is no history of seizures.   The patient is also here for followup and review of noninvasive studies of the lower extremities.    There has been a significant changes to the patient's overall health care.  At today's appointment she is also complaining of a significant increase in her leg pain and a marked decrease in her claudication distance.  She is also describing mild rest pain symptoms on the right compared to the left.  She denies dangling her foot off the side of the bed while trying to sleep.  There have been no new open wounds or sores.  The patient has no had any past angiography, interventions or vascular surgery.  Patient is also followed for leg swelling. The patient first noticed the swelling remotely. The swelling is associated with pain and discoloration. The pain and swelling worsens with prolonged dependency and improves with elevation. The pain is unrelated to activity.  The patient notes that in the morning the legs are significantly improved but they steadily worsened throughout the course of the day. The patient also notes a steady worsening of the discoloration in the ankle and shin area.  Overall she states this particular issue has been stable compared to her previous visit.   Elevation makes the leg symptoms  better, dependency makes them much worse. There is no history of ulcerations. The patient denies any recent changes in medications.   The patient has not been wearing graduated compression.   The patient denies a history of DVT or PE. There is no prior history of phlebitis. There is no history of primary lymphedema.   No history of malignancies. No history of trauma or groin or pelvic surgery. There is no history of radiation treatment to the groin or pelvis   The patient has a history of coronary artery disease, no recent episodes of angina or shortness of breath. There is a history of hyperlipidemia which is being treated with a statin.     Carotid Duplex done 11/15/2023 shows RICA 40-59% (s/p right CEA) and known occlusion of the left ICA.  No significant change compared to last study in 10/13/2021   Previous ABI's Rt=1.00 (biphasic) and Lt=1.11 (triphasic) (previous ABI Rt=0.98 and Lt=1.15).   Duplex ultrasound of left lower extremity shows a high grade stenosis of the common iliac arteries as well as a high grade left popliteal stenosis.   CT scanning of the chest for lung cancer screening dated 05/01/2022 is reviewed by me demonstrates a  stable saccular descending thoracic aortic aneurysm measuring 3.8 cm in maximal dimension  Current Meds  Medication Sig   amLODipine  (NORVASC ) 5 MG tablet Take 1 tablet (5 mg total) by mouth daily.   clopidogrel  (PLAVIX ) 75 MG tablet Take 75 mg by mouth daily.   escitalopram  (LEXAPRO ) 10 MG tablet Take 10 mg by mouth daily.   escitalopram  (LEXAPRO ) 20 MG tablet Take 20 mg by mouth daily.   TRELEGY ELLIPTA 100-62.5-25 MCG/ACT AEPB Inhale 1 puff into the lungs daily.    Past Medical History:  Diagnosis Date   Anginal pain    Anxiety    Aortic atherosclerosis    Arthritis    Cancer (HCC) 01/2017   Skin cancer on right leg   Carotid stenosis, left    100 % blockage   Cholelithiasis    Chronic enteritis    Chronic venous insufficiency     Colon polyps    COPD (chronic obstructive pulmonary disease) (HCC)    mild   Coronary artery disease    Depression    Diastasis recti    Diverticulosis    Dyspnea    Erosive esophagitis    Fatty liver disease, nonalcoholic    Gastritis    GERD (gastroesophageal reflux disease)    Headache    Herniated lumbar intervertebral disc    Hyperlipidemia    Hypertension    IBS (irritable bowel syndrome)    Left carotid artery stenosis    Osteopenia    Peptic ulcer disease    Personal history of tobacco use, presenting hazards to health 03/14/2016   SCC (squamous cell carcinoma)    Stroke (HCC)    TIA's X 2   Tobacco dependence    Vitamin D deficiency     Past Surgical History:  Procedure Laterality Date   ABDOMINAL HYSTERECTOMY     BREAST BIOPSY Left    negative 2008   CAROTID ENDARTERECTOMY     CHOLECYSTECTOMY     COLONOSCOPY W/ POLYPECTOMY     COLONOSCOPY WITH PROPOFOL  N/A 07/26/2020   Procedure: COLONOSCOPY WITH PROPOFOL ;  Surgeon: Toledo, Ladell POUR, MD;  Location: [facility];  Service: Gastroenterology;  Laterality: N/A;   DILATION AND CURETTAGE OF UTERUS     ESOPHAGOGASTRODUODENOSCOPY (EGD) WITH PROPOFOL  N/A 01/26/2017   Procedure: ESOPHAGOGASTRODUODENOSCOPY (EGD) WITH PROPOFOL ;  Surgeon: Toledo, Ladell POUR, MD;  Location: [facility];  Service: Gastroenterology;  Laterality: N/A;   ESOPHAGOGASTRODUODENOSCOPY (EGD) WITH PROPOFOL  N/A 07/26/2020   Procedure: ESOPHAGOGASTRODUODENOSCOPY (EGD) WITH PROPOFOL ;  Surgeon: Toledo, Ladell POUR, MD;  Location: [facility];  Service: Gastroenterology;  Laterality: N/A;   HEMORRHOID SURGERY N/A 07/25/2016   Procedure: HEMORRHOIDECTOMY INTERNAL AND EXTERNAL;  Surgeon: Larinda Unknown Sharps, MD;  Location: [facility] ORS;  Service: General;  Laterality: N/A;   MICROLARYNGOSCOPY N/A 01/29/2019   Procedure: MICROLARYNGOSCOPY with Vocal Cord polyp removal.   No laser is needed.;  Surgeon: Blair Mt, MD;  Location: [facility] ORS;  Service: ENT;   Laterality: N/A;   VASCULAR SURGERY Right    Carotid Endarterectomy     Social History Social History   Tobacco Use   Smoking status: Every Day    Current packs/day: 1.00    Average packs/day: 1 pack/day for 45.0 years (45.0 ttl pk-yrs)    Types: Cigarettes    Passive exposure: Never   Smokeless tobacco: Never  Vaping Use   Vaping status: Former  Substance Use Topics   Alcohol use: No   Drug use: No    Family History Family History  Problem Relation Age of Onset   Breast cancer Mother 70   Atrial fibrillation Mother    Breast cancer Sister 24   Heart attack Father    Colon cancer Brother    Lung cancer Brother     Allergies  Allergen Reactions   Buspirone     Other reaction(s): Headache   Chantix [Varenicline Tartrate] Other (See Comments)    Jittery muscle movements   Statins Other (See Comments)    Muscle and joint aches - can take in small doses   Estradiol  Itching   Sulfa Antibiotics     Other reaction(s): Unknown   Zithromax [Azithromycin] Other (See Comments)    Back Pain   Elemental Sulfur Rash     REVIEW OF SYSTEMS (Negative unless checked)  Constitutional: [] Weight loss  [] Fever  [] Chills Cardiac: [] Chest pain   [] Chest pressure   [] Palpitations   [] Shortness of breath when laying flat   [] Shortness of breath with exertion. Vascular:  [x] Pain in legs with walking   [] Pain in legs at rest  [] History of DVT   [] Phlebitis   [] Swelling in legs   [] Varicose veins   [] Non-healing ulcers Pulmonary:   [] Uses home oxygen   [] Productive cough   [] Hemoptysis   [] Wheeze  [] COPD   [] Asthma Neurologic:  [] Dizziness   [] Seizures   [] History of stroke   [] History of TIA  [] Aphasia   [] Vissual changes   [] Weakness or numbness in arm   [] Weakness or numbness in leg Musculoskeletal:   [] Joint swelling   [] Joint pain   [] Low back pain Hematologic:  [] Easy bruising  [] Easy bleeding   [] Hypercoagulable state   [] Anemic Gastrointestinal:  [] Diarrhea   [] Vomiting   [] Gastroesophageal reflux/heartburn   [] Difficulty swallowing. Genitourinary:  [] Chronic kidney disease   [] Difficult urination  [] Frequent urination   [] Blood in urine Skin:  [] Rashes   [] Ulcers  Psychological:  [] History of anxiety   []  History of major depression.  Physical Examination  Vitals:   01/22/24 0710  BP: (!) 143/47  Pulse: 69  Resp: 20  Temp: 98.5 F (36.9 C)  TempSrc: Temporal  SpO2: 96%  Weight: 57.1 kg  Height: 5' 2 (1.575 m)   Body mass index is 23.01 kg/m. Gen: WD/WN, NAD Head: [place]/AT, No temporalis wasting.  Ear/Nose/Throat: Hearing grossly intact, nares w/o erythema or drainage Eyes: PER, EOMI, sclera nonicteric.  Neck: Supple, no masses.  No bruit or JVD.  Pulmonary:  Good air movement, no audible wheezing, no use of accessory muscles.  Cardiac: RRR, normal S1, S2, no Murmurs. Vascular:  mild trophic changes, no open wounds Vessel Right Left  Radial Palpable Palpable  PT Not Palpable Not Palpable  DP Not Palpable Not Palpable  Gastrointestinal: soft, non-distended. No guarding/no peritoneal signs.  Musculoskeletal: M/S 5/5 throughout.  No visible deformity.  Neurologic: CN 2-12 intact. Pain and light touch intact in extremities.  Symmetrical.  Speech is fluent. Motor exam as listed above. Psychiatric: Judgment intact, Mood & affect appropriate for pt's clinical situation. Dermatologic: No rashes or ulcers noted.  No changes consistent with cellulitis.   CBC Lab Results  Component Value Date   WBC 8.4 11/22/2023   HGB 16.5 (H) 11/22/2023   HCT 49.3 (H) 11/22/2023   MCV 96 11/22/2023   PLT 260 11/22/2023    BMET    Component Value Date/Time   NA 145 (H) 11/22/2023 0819   NA 142 01/29/2014 2124   K 4.1 11/22/2023 0819   K 4.0 01/29/2014 2124  CL 106 11/22/2023 0819   CL 110 (H) 01/29/2014 2124   CO2 21 11/22/2023 0819   CO2 25 01/29/2014 2124   GLUCOSE 109 (H) 11/22/2023 0819   GLUCOSE 183 (H) 03/16/2022 0448   GLUCOSE 137 (H)  01/29/2014 2124   BUN 16 01/22/2024 0723   BUN 10 11/22/2023 0819   BUN 10 01/29/2014 2124   CREATININE 0.74 01/22/2024 0723   CREATININE 1.00 01/29/2014 2124   CALCIUM  9.9 11/22/2023 0819   CALCIUM  9.3 01/29/2014 2124   GFRNONAA >60 01/22/2024 0723   GFRNONAA 59 (L) 01/29/2014 2124   GFRNONAA >60 12/22/2013 1159   GFRAA >60 04/25/2018 1713   GFRAA >60 01/29/2014 2124   GFRAA >60 12/22/2013 1159   Estimated Creatinine Clearance: 48.1 mL/min (by C-G formula based on SCr of 0.74 mg/dL).  COAG Lab Results  Component Value Date   INR 1.0 11/15/2021   INR 1.1 12/31/2020    Radiology No results found.   Assessment/Plan 1. Atherosclerosis of native artery of right lower extremity with intermittent claudication (HCC) (Primary) Recommend:   The patient has experienced increased claudication symptoms and is now describing lifestyle limiting claudication and appears to be having mild rest pain symptroms.   Given the severity of the patient's severe left lower extremity symptoms the patient should undergo angiography with the hope for intervention.  Risk and benefits were reviewed the patient.  Indications for the procedure were reviewed.  All questions were answered, the patient agrees to proceed with left lower extremity angiography and possible intervention.    The patient should continue walking and begin a more formal exercise program.  The patient should continue antiplatelet therapy and aggressive treatment of the lipid abnormalities   The patient will follow up with me after the angiogram.    I70.21      Atherosclerotic occlusive disease with claudication   CPT codes: 62778   stent placement iliac artery 37226   stent placement femoral-popliteal artery 36247   introduction catheter below diaphragm third order   2. Aneurysm of descending thoracic aorta without rupture (HCC) Recommend:   No surgery or intervention is indicated at this time.   The patient has an  asymptomatic thoracic aortic aneurysm that is less than 6.0 cm in maximal diameter.  I have discussed the natural history of thoracic aortic aneurysm and the small risk of rupture for aneurysm less than 6.5 cm in size.  However, as these small aneurysms tend to enlarge over time, continued surveillance with CT scan is mandatory.    I have also discussed optimizing medical management with hypertension and lipid control and the importance of abstinence from tobacco.  The patient is also encouraged to exercise a minimum of 30 minutes 4 times a week.    Should the patient develop new onset chest or back pain or signs of peripheral embolization they are instructed to seek medical attention immediately and to alert the physician providing care that they have an aneurysm in the chest.    The patient voices their understanding.   The patient will return as ordered with a CT scan of the chest   3. Occlusion of left carotid artery Recommend:   Given the patient's asymptomatic subcritical stenosis no further invasive testing or surgery at this time.   Carotid Duplex done today shows RICA 1-39% (s/p right CEA) and known occlusion of the left ICA.  No significant change compared to last study in 10/13/2021   Continue antiplatelet therapy as prescribed Continue management of  CAD, HTN and Hyperlipidemia Healthy heart diet,  encouraged exercise at least 4 times per week Follow up in 12 months with duplex ultrasound and physical exam     4. Chronic venous insufficiency Recommend:   I have had a long discussion with the patient regarding swelling and why it  causes symptoms.  Patient will begin wearing graduated compression on a daily basis a prescription was given. The patient will  wear the stockings first thing in the morning and removing them in the evening. The patient is instructed specifically not to sleep in the stockings.    In addition, behavioral modification will be initiated.  This will include  frequent elevation, use of over the counter pain medications and exercise such as walking.   Consideration for a lymph pump will also be made based upon the effectiveness of conservative therapy.  This would help to improve the edema control and prevent sequela such as ulcers and infections    5. Coronary artery disease involving native coronary artery of native heart with angina pectoris (HCC) Continue cardiac and antihypertensive medications as already ordered and reviewed, no changes at this time.   Continue statin as ordered and reviewed, no changes at this time   Nitrates PRN for chest pain       [name], MD  01/22/2024 8:24 AM

## 2024-01-22 NOTE — Op Note (Signed)
 [facility]  Percutaneous Study/Intervention Procedural Note   Date of Surgery: 01/22/2024  Surgeon:Oriya Kettering, Molly MATSU   Pre-operative Diagnosis: Atherosclerotic occlusive disease bilateral lower extremities with rest pain of the left lower extremity  Post-operative diagnosis:  Same  Procedure(s) Performed:  1.  Abdominal aortogram  2.  Bilateral distal runoff  3.  Percutaneous transluminal angioplasty and stent placement right common iliac artery; kissing balloon technique  4.  Percutaneous transluminal and plasty and stent placement left common iliac artery; kissing balloon technique  5.  Percutaneous transluminal angioplasty and stent placement left popliteal artery              6.  Ultrasound guided access bilateral common femoral arteries  7.  StarClose closure device bilateral common femoral arteries  Anesthesia: Conscious sedation was administered under my direct supervision by the interventional radiology RN. IV Versed  plus fentanyl  were utilized. Continuous ECG, pulse oximetry and blood pressure was monitored throughout the entire procedure. Conscious sedation was for a total of 51 minutes.  Sheath: Bilateral 23 cm 7 French Pinnacle sheaths retrograde common femoral arteries  Contrast: 70 cc  Fluoroscopy Time: 7.7 minutes  Indications: Patient presents with increasing pain of both lower extremities.  Physical examination as well as noninvasive studies demonstrate significant atherosclerotic occlusive disease.  Angiography with hope for intervention has been recommended.  Risks and benefits have been reviewed all questions have been answered alternative therapies have also been discussed patient has agreed to proceed.  Procedure:  Molly C Brooksis a 73 y.o. female who was identified and appropriate procedural time out was performed.  The patient was then placed supine on the table and prepped and draped in the usual sterile fashion.  Ultrasound was  used to evaluate the right common femoral artery.  It was echolucent and pulsatile indicating it is patent .  An ultrasound image was acquired for the permanent record.  A micropuncture needle was used to access the right common femoral artery under direct ultrasound guidance.  The microwire was then advanced under fluoroscopic guidance without difficulty followed by the micro-sheath  A 0.035 J wire was advanced without resistance and a 5Fr sheath was placed.    The pigtail catheter was then positioned at the level of T12 and an AP image of the aorta was obtained. After review the images the pigtail catheter was repositioned above the aortic bifurcation and bilateral oblique views of the pelvis were obtained. Subsequently the detector was returned to the AP position and the pigtail catheter was used to cross the aortic bifurcation with an advantage wire.  Catheter was then negotiated down into the distal external iliac artery and LAO projection of the left femoral bifurcation was obtained.  Wire was reintroduced and the catheter negotiated into the SFA.  Distal runoff was then obtained.  This demonstrated greater than 80% stenosis in the above-knee popliteal.    6000 units of heparin  was given and allowed to circulate for several minutes.  A supra core wire was then advanced through the pigtail catheter and negotiated across the stenosis.  The 5 French sheath was then upsized to a 6 [place] Ansell sheath.  Magnified imaging of the popliteal stenosis with send obtained.  A 5 mm x 40 mm life stent was then deployed across this lesion and postdilated with a 4 mm x 60 mm Lutonix drug-eluting balloon inflated to 10 atm for approximately 1 minute.  Follow-up imaging demonstrated wide patency with less than 10% residual stenosis and preservation of  distal runoff.  The sheath was then pulled back into the right common iliac artery and the super core wire then pulled back and advanced into the descending thoracic  aorta.  The ultrasound was reprepped and delivered back onto the sterile field. The left common femoral was then imaged with the ultrasound it was noted to be echolucent and pulsatile indicating patency. Images recorded for the permanent record. Under real-time visualization a microneedle was inserted into the anterior wall the common femoral artery microwire was then advanced without difficulty under fluoroscopic guidance followed by placement of the micro-sheath.  A J-wire was then negotiated under fluoroscopic guidance into the aorta.  7 French sheath was then placed.  The right sheath was then upsized to a 7 [place] sheath over the super core wire. Magnified images of the aortic bifurcation were then made using hand injection contrast from the femoral sheaths. After appropriate sizing a 8 x 37 Lifestream stent was selected for the right and a 8 x 37 Lifestream stent was selected for the left. There were then advanced and positioned just above the aortic bifurcation. Insufflation for full expansion of the stents was performed simultaneously. Follow-up imaging was then performed and there is wide patency of the aortic bifurcation with less than 10% residual stenosis.  Both common iliac arteries are widely patent..  Oblique views were then obtained of the groins in succession and Star close device is deployed without difficulty. There were no immediate complications   Findings:   Aortogram:  The abdominal aorta is opacified with a bolus injection contrast. Demonstrates diffuse disease but there are no hemodynamically significant lesions noted until the distal aortic bifurcation where bilateral greater than 80% ostial iliac lesions are identified. There is moderate poststenotic dilatation noted of the common iliac arteries as well.  Right Lower Extremity: Common femoral is widely patent.  The visualized portions of the profunda femoris and superficial femoral arteries are patent  Left Lower Extremity: The  left common femoral profunda femoris are widely patent.  Superficial femoral is patent and free of hemodynamically significant stenosis.  In the above-knee popliteal at the level of the femoral condyles there is a greater than 80% stenosis.  The distal popliteal is free of hemodynamically significant stenosis.  There is two-vessel runoff to the foot with the anterior tibial posterior tibial being patent.  Posterior tibial is the dominant runoff.  Following angioplasty and stent placement of the popliteal lesion on the left there is now less than 10% residual stenosis and preservation of distal runoff.  Following placement of the bilateral iliac stents there is now wide patency with less than 10% residual stenosis with rapid flow through the aortic bifurcation bilaterally.  Summary:  Successful reconstruction of the distal aorta and bilateral iliac arteries  Disposition: Patient was taken to the recovery room in stable condition having tolerated the procedure well.  Molly Matthews 01/22/2024,9:40 AM

## 2024-01-22 NOTE — Interval H&P Note (Signed)
 History and Physical Interval Note:  01/22/2024 8:26 AM  Molly Matthews Sprang  has presented today for surgery, with the diagnosis of LLE Angio w intervention   ASO w claudication.  The various methods of treatment have been discussed with the patient and family. After consideration of risks, benefits and other options for treatment, the patient has consented to  Procedure(s): LOWER EXTREMITY INTERVENTION (Left) Lower Extremity Angiography (Left) as a surgical intervention.  The patient's history has been reviewed, patient examined, no change in status, stable for surgery.  I have reviewed the patient's chart and labs.  Questions were answered to the patient's satisfaction.     [name]

## 2024-01-22 NOTE — H&P (View-Only) (Signed)
 MRN : [id number]  Molly Matthews is a 73 y.o. (05/17/1950) female who presents with chief complaint of check circulation.  History of Present Illness:   Patient presents to [facility] today for treatment of her left lower extremity atherosclerotic occlusive disease.  She was last seen in the office December 17, 2023.     The patient denies amaurosis fugax. There is no recent history of TIA symptoms or focal motor deficits. There is no prior documented CVA.   The patient is taking enteric-coated aspirin  81 mg daily.   There is no history of migraine headaches. There is no history of seizures.   The patient is also here for followup and review of noninvasive studies of the lower extremities.    There has been a significant changes to the patient's overall health care.  At today's appointment she is also complaining of a significant increase in her leg pain and a marked decrease in her claudication distance.  She is also describing mild rest pain symptoms on the right compared to the left.  She denies dangling her foot off the side of the bed while trying to sleep.  There have been no new open wounds or sores.  The patient has no had any past angiography, interventions or vascular surgery.  Patient is also followed for leg swelling. The patient first noticed the swelling remotely. The swelling is associated with pain and discoloration. The pain and swelling worsens with prolonged dependency and improves with elevation. The pain is unrelated to activity.  The patient notes that in the morning the legs are significantly improved but they steadily worsened throughout the course of the day. The patient also notes a steady worsening of the discoloration in the ankle and shin area.  Overall she states this particular issue has been stable compared to her previous visit.   Elevation makes the leg symptoms  better, dependency makes them much worse. There is no history of ulcerations. The patient denies any recent changes in medications.   The patient has not been wearing graduated compression.   The patient denies a history of DVT or PE. There is no prior history of phlebitis. There is no history of primary lymphedema.   No history of malignancies. No history of trauma or groin or pelvic surgery. There is no history of radiation treatment to the groin or pelvis   The patient has a history of coronary artery disease, no recent episodes of angina or shortness of breath. There is a history of hyperlipidemia which is being treated with a statin.     Carotid Duplex done 11/15/2023 shows RICA 40-59% (s/p right CEA) and known occlusion of the left ICA.  No significant change compared to last study in 10/13/2021   Previous ABI's Rt=1.00 (biphasic) and Lt=1.11 (triphasic) (previous ABI Rt=0.98 and Lt=1.15).   Duplex ultrasound of left lower extremity shows a high grade stenosis of the common iliac arteries as well as a high grade left popliteal stenosis.   CT scanning of the chest for lung cancer screening dated 05/01/2022 is reviewed by me demonstrates a  stable saccular descending thoracic aortic aneurysm measuring 3.8 cm in maximal dimension  Current Meds  Medication Sig   amLODipine  (NORVASC ) 5 MG tablet Take 1 tablet (5 mg total) by mouth daily.   clopidogrel  (PLAVIX ) 75 MG tablet Take 75 mg by mouth daily.   escitalopram  (LEXAPRO ) 10 MG tablet Take 10 mg by mouth daily.   escitalopram  (LEXAPRO ) 20 MG tablet Take 20 mg by mouth daily.   TRELEGY ELLIPTA 100-62.5-25 MCG/ACT AEPB Inhale 1 puff into the lungs daily.    Past Medical History:  Diagnosis Date   Anginal pain    Anxiety    Aortic atherosclerosis    Arthritis    Cancer (HCC) 01/2017   Skin cancer on right leg   Carotid stenosis, left    100 % blockage   Cholelithiasis    Chronic enteritis    Chronic venous insufficiency     Colon polyps    COPD (chronic obstructive pulmonary disease) (HCC)    mild   Coronary artery disease    Depression    Diastasis recti    Diverticulosis    Dyspnea    Erosive esophagitis    Fatty liver disease, nonalcoholic    Gastritis    GERD (gastroesophageal reflux disease)    Headache    Herniated lumbar intervertebral disc    Hyperlipidemia    Hypertension    IBS (irritable bowel syndrome)    Left carotid artery stenosis    Osteopenia    Peptic ulcer disease    Personal history of tobacco use, presenting hazards to health 03/14/2016   SCC (squamous cell carcinoma)    Stroke (HCC)    TIA's X 2   Tobacco dependence    Vitamin D deficiency     Past Surgical History:  Procedure Laterality Date   ABDOMINAL HYSTERECTOMY     BREAST BIOPSY Left    negative 2008   CAROTID ENDARTERECTOMY     CHOLECYSTECTOMY     COLONOSCOPY W/ POLYPECTOMY     COLONOSCOPY WITH PROPOFOL  N/A 07/26/2020   Procedure: COLONOSCOPY WITH PROPOFOL ;  Surgeon: Toledo, Ladell POUR, MD;  Location: [facility];  Service: Gastroenterology;  Laterality: N/A;   DILATION AND CURETTAGE OF UTERUS     ESOPHAGOGASTRODUODENOSCOPY (EGD) WITH PROPOFOL  N/A 01/26/2017   Procedure: ESOPHAGOGASTRODUODENOSCOPY (EGD) WITH PROPOFOL ;  Surgeon: Toledo, Ladell POUR, MD;  Location: [facility];  Service: Gastroenterology;  Laterality: N/A;   ESOPHAGOGASTRODUODENOSCOPY (EGD) WITH PROPOFOL  N/A 07/26/2020   Procedure: ESOPHAGOGASTRODUODENOSCOPY (EGD) WITH PROPOFOL ;  Surgeon: Toledo, Ladell POUR, MD;  Location: [facility];  Service: Gastroenterology;  Laterality: N/A;   HEMORRHOID SURGERY N/A 07/25/2016   Procedure: HEMORRHOIDECTOMY INTERNAL AND EXTERNAL;  Surgeon: Larinda Unknown Sharps, MD;  Location: [facility];  Service: General;  Laterality: N/A;   MICROLARYNGOSCOPY N/A 01/29/2019   Procedure: MICROLARYNGOSCOPY with Vocal Cord polyp removal.   No laser is needed.;  Surgeon: Blair Mt, MD;  Location: [facility] OR[facility];  Service: ENT;   Laterality: N/A;   VASCULAR SURGERY Right    Carotid Endarterectomy     Social History Social History   Tobacco Use   Smoking status: Every Day    Current packs/day: 1.00    Average packs/day: 1 pack/day for 45.0 years (45.0 ttl pk-yrs)    Types: Cigarettes    Passive exposure: Never   Smokeless tobacco: Never  Vaping Use   Vaping status: Former  Substance Use Topics   Alcohol use: No   Drug use: No    Family History Family History  Problem Relation Age of Onset   Breast cancer Mother 70   Atrial fibrillation Mother    Breast cancer Sister 24   Heart attack Father    Colon cancer Brother    Lung cancer Brother     Allergies  Allergen Reactions   Buspirone     Other reaction(s): Headache   Chantix [Varenicline Tartrate] Other (See Comments)    Jittery muscle movements   Statins Other (See Comments)    Muscle and joint aches - can take in small doses   Estradiol  Itching   Sulfa Antibiotics     Other reaction(s): Unknown   Zithromax [Azithromycin] Other (See Comments)    Back Pain   Elemental Sulfur Rash     REVIEW OF SYSTEMS (Negative unless checked)  Constitutional: [] Weight loss  [] Fever  [] Chills Cardiac: [] Chest pain   [] Chest pressure   [] Palpitations   [] Shortness of breath when laying flat   [] Shortness of breath with exertion. Vascular:  [x] Pain in legs with walking   [] Pain in legs at rest  [] History of DVT   [] Phlebitis   [] Swelling in legs   [] Varicose veins   [] Non-healing ulcers Pulmonary:   [] Uses home oxygen   [] Productive cough   [] Hemoptysis   [] Wheeze  [] COPD   [] Asthma Neurologic:  [] Dizziness   [] Seizures   [] History of stroke   [] History of TIA  [] Aphasia   [] Vissual changes   [] Weakness or numbness in arm   [] Weakness or numbness in leg Musculoskeletal:   [] Joint swelling   [] Joint pain   [] Low back pain Hematologic:  [] Easy bruising  [] Easy bleeding   [] Hypercoagulable state   [] Anemic Gastrointestinal:  [] Diarrhea   [] Vomiting   [] Gastroesophageal reflux/heartburn   [] Difficulty swallowing. Genitourinary:  [] Chronic kidney disease   [] Difficult urination  [] Frequent urination   [] Blood in urine Skin:  [] Rashes   [] Ulcers  Psychological:  [] History of anxiety   []  History of major depression.  Physical Examination  Vitals:   01/22/24 0710  BP: (!) 143/47  Pulse: 69  Resp: 20  Temp: 98.5 F (36.9 C)  TempSrc: Temporal  SpO2: 96%  Weight: 57.1 kg  Height: 5' 2 (1.575 m)   Body mass index is 23.01 kg/m. Gen: WD/WN, NAD Head: [place]/AT, No temporalis wasting.  Ear/Nose/Throat: Hearing grossly intact, nares w/o erythema or drainage Eyes: PER, EOMI, sclera nonicteric.  Neck: Supple, no masses.  No bruit or JVD.  Pulmonary:  Good air movement, no audible wheezing, no use of accessory muscles.  Cardiac: RRR, normal S1, S2, no Murmurs. Vascular:  mild trophic changes, no open wounds Vessel Right Left  Radial Palpable Palpable  PT Not Palpable Not Palpable  DP Not Palpable Not Palpable  Gastrointestinal: soft, non-distended. No guarding/no peritoneal signs.  Musculoskeletal: M/S 5/5 throughout.  No visible deformity.  Neurologic: CN 2-12 intact. Pain and light touch intact in extremities.  Symmetrical.  Speech is fluent. Motor exam as listed above. Psychiatric: Judgment intact, Mood & affect appropriate for pt's clinical situation. Dermatologic: No rashes or ulcers noted.  No changes consistent with cellulitis.   CBC Lab Results  Component Value Date   WBC 8.4 11/22/2023   HGB 16.5 (H) 11/22/2023   HCT 49.3 (H) 11/22/2023   MCV 96 11/22/2023   PLT 260 11/22/2023    BMET    Component Value Date/Time   NA 145 (H) 11/22/2023 0819   NA 142 01/29/2014 2124   K 4.1 11/22/2023 0819   K 4.0 01/29/2014 2124  CL 106 11/22/2023 0819   CL 110 (H) 01/29/2014 2124   CO2 21 11/22/2023 0819   CO2 25 01/29/2014 2124   GLUCOSE 109 (H) 11/22/2023 0819   GLUCOSE 183 (H) 03/16/2022 0448   GLUCOSE 137 (H)  01/29/2014 2124   BUN 16 01/22/2024 0723   BUN 10 11/22/2023 0819   BUN 10 01/29/2014 2124   CREATININE 0.74 01/22/2024 0723   CREATININE 1.00 01/29/2014 2124   CALCIUM  9.9 11/22/2023 0819   CALCIUM  9.3 01/29/2014 2124   GFRNONAA >60 01/22/2024 0723   GFRNONAA 59 (L) 01/29/2014 2124   GFRNONAA >60 12/22/2013 1159   GFRAA >60 04/25/2018 1713   GFRAA >60 01/29/2014 2124   GFRAA >60 12/22/2013 1159   Estimated Creatinine Clearance: 48.1 mL/min (by C-G formula based on SCr of 0.74 mg/dL).  COAG Lab Results  Component Value Date   INR 1.0 11/15/2021   INR 1.1 12/31/2020    Radiology No results found.   Assessment/Plan 1. Atherosclerosis of native artery of right lower extremity with intermittent claudication (HCC) (Primary) Recommend:   The patient has experienced increased claudication symptoms and is now describing lifestyle limiting claudication and appears to be having mild rest pain symptroms.   Given the severity of the patient's severe left lower extremity symptoms the patient should undergo angiography with the hope for intervention.  Risk and benefits were reviewed the patient.  Indications for the procedure were reviewed.  All questions were answered, the patient agrees to proceed with left lower extremity angiography and possible intervention.    The patient should continue walking and begin a more formal exercise program.  The patient should continue antiplatelet therapy and aggressive treatment of the lipid abnormalities   The patient will follow up with me after the angiogram.    I70.21      Atherosclerotic occlusive disease with claudication   CPT codes: 62778   stent placement iliac artery 37226   stent placement femoral-popliteal artery 36247   introduction catheter below diaphragm third order   2. Aneurysm of descending thoracic aorta without rupture (HCC) Recommend:   No surgery or intervention is indicated at this time.   The patient has an  asymptomatic thoracic aortic aneurysm that is less than 6.0 cm in maximal diameter.  I have discussed the natural history of thoracic aortic aneurysm and the small risk of rupture for aneurysm less than 6.5 cm in size.  However, as these small aneurysms tend to enlarge over time, continued surveillance with CT scan is mandatory.    I have also discussed optimizing medical management with hypertension and lipid control and the importance of abstinence from tobacco.  The patient is also encouraged to exercise a minimum of 30 minutes 4 times a week.    Should the patient develop new onset chest or back pain or signs of peripheral embolization they are instructed to seek medical attention immediately and to alert the physician providing care that they have an aneurysm in the chest.    The patient voices their understanding.   The patient will return as ordered with a CT scan of the chest   3. Occlusion of left carotid artery Recommend:   Given the patient's asymptomatic subcritical stenosis no further invasive testing or surgery at this time.   Carotid Duplex done today shows RICA 1-39% (s/p right CEA) and known occlusion of the left ICA.  No significant change compared to last study in 10/13/2021   Continue antiplatelet therapy as prescribed Continue management of  CAD, HTN and Hyperlipidemia Healthy heart diet,  encouraged exercise at least 4 times per week Follow up in 12 months with duplex ultrasound and physical exam     4. Chronic venous insufficiency Recommend:   I have had a long discussion with the patient regarding swelling and why it  causes symptoms.  Patient will begin wearing graduated compression on a daily basis a prescription was given. The patient will  wear the stockings first thing in the morning and removing them in the evening. The patient is instructed specifically not to sleep in the stockings.    In addition, behavioral modification will be initiated.  This will include  frequent elevation, use of over the counter pain medications and exercise such as walking.   Consideration for a lymph pump will also be made based upon the effectiveness of conservative therapy.  This would help to improve the edema control and prevent sequela such as ulcers and infections    5. Coronary artery disease involving native coronary artery of native heart with angina pectoris (HCC) Continue cardiac and antihypertensive medications as already ordered and reviewed, no changes at this time.   Continue statin as ordered and reviewed, no changes at this time   Nitrates PRN for chest pain       [name], MD  01/22/2024 8:24 AM

## 2024-01-22 NOTE — Progress Notes (Signed)
 0950 upon arrival to recovery pt c/o upper abd/rib cage pain 7/10 bilaterally.  Bil access sites soft unremarkable abd soft and unremarkable. Notified dr. Jama of the above.  Dilaudid  given 0.5mg  x 2 doses will continue to assess and monitor for results. Cold compress applied to forehead. Family at bedside

## 2024-01-22 NOTE — Progress Notes (Signed)
 Pt. Assisted OOB , to BR then back to room. Pt. Legs shaky. States I have restless leg syndrome. Pt. States abdominal pressure gone after she went to the BR. Pt. Wishes to go home now, stating she will speak with the doctor on follow-up.

## 2024-02-07 ENCOUNTER — Other Ambulatory Visit (INDEPENDENT_AMBULATORY_CARE_PROVIDER_SITE_OTHER): Payer: Self-pay | Admitting: Vascular Surgery

## 2024-02-07 DIAGNOSIS — Z9889 Other specified postprocedural states: Secondary | ICD-10-CM

## 2024-02-11 ENCOUNTER — Ambulatory Visit (INDEPENDENT_AMBULATORY_CARE_PROVIDER_SITE_OTHER): Admitting: Vascular Surgery

## 2024-02-11 ENCOUNTER — Ambulatory Visit (INDEPENDENT_AMBULATORY_CARE_PROVIDER_SITE_OTHER)

## 2024-02-11 ENCOUNTER — Encounter (INDEPENDENT_AMBULATORY_CARE_PROVIDER_SITE_OTHER): Payer: Self-pay | Admitting: Vascular Surgery

## 2024-02-11 VITALS — BP 107/51 | HR 77 | Resp 16 | Ht 62.0 in | Wt 135.4 lb

## 2024-02-11 DIAGNOSIS — E782 Mixed hyperlipidemia: Secondary | ICD-10-CM

## 2024-02-11 DIAGNOSIS — I6523 Occlusion and stenosis of bilateral carotid arteries: Secondary | ICD-10-CM

## 2024-02-11 DIAGNOSIS — I739 Peripheral vascular disease, unspecified: Secondary | ICD-10-CM

## 2024-02-11 DIAGNOSIS — I7123 Aneurysm of the descending thoracic aorta, without rupture: Secondary | ICD-10-CM | POA: Diagnosis not present

## 2024-02-11 DIAGNOSIS — I872 Venous insufficiency (chronic) (peripheral): Secondary | ICD-10-CM | POA: Diagnosis not present

## 2024-02-11 DIAGNOSIS — Z9889 Other specified postprocedural states: Secondary | ICD-10-CM | POA: Diagnosis not present

## 2024-02-11 DIAGNOSIS — I70211 Atherosclerosis of native arteries of extremities with intermittent claudication, right leg: Secondary | ICD-10-CM | POA: Diagnosis not present

## 2024-02-11 DIAGNOSIS — J449 Chronic obstructive pulmonary disease, unspecified: Secondary | ICD-10-CM

## 2024-02-11 LAB — VAS US ABI WITH/WO TBI
Left ABI: 1.12
Right ABI: 1.08

## 2024-02-18 ENCOUNTER — Encounter: Payer: Self-pay | Admitting: Acute Care

## 2024-02-20 ENCOUNTER — Telehealth: Payer: Self-pay

## 2024-02-20 ENCOUNTER — Encounter: Payer: Self-pay | Admitting: Family Medicine

## 2024-02-20 ENCOUNTER — Encounter (INDEPENDENT_AMBULATORY_CARE_PROVIDER_SITE_OTHER): Payer: Self-pay | Admitting: Vascular Surgery

## 2024-02-20 ENCOUNTER — Ambulatory Visit (INDEPENDENT_AMBULATORY_CARE_PROVIDER_SITE_OTHER): Admitting: Family Medicine

## 2024-02-20 VITALS — BP 126/58 | HR 92 | Wt 139.1 lb

## 2024-02-20 DIAGNOSIS — E782 Mixed hyperlipidemia: Secondary | ICD-10-CM

## 2024-02-20 DIAGNOSIS — F411 Generalized anxiety disorder: Secondary | ICD-10-CM | POA: Diagnosis not present

## 2024-02-20 DIAGNOSIS — F4381 Prolonged grief disorder: Secondary | ICD-10-CM | POA: Diagnosis not present

## 2024-02-20 DIAGNOSIS — J449 Chronic obstructive pulmonary disease, unspecified: Secondary | ICD-10-CM

## 2024-02-20 DIAGNOSIS — F332 Major depressive disorder, recurrent severe without psychotic features: Secondary | ICD-10-CM

## 2024-02-20 DIAGNOSIS — I739 Peripheral vascular disease, unspecified: Secondary | ICD-10-CM

## 2024-02-20 DIAGNOSIS — F4321 Adjustment disorder with depressed mood: Secondary | ICD-10-CM

## 2024-02-20 DIAGNOSIS — Z634 Disappearance and death of family member: Secondary | ICD-10-CM

## 2024-02-20 DIAGNOSIS — I25119 Atherosclerotic heart disease of native coronary artery with unspecified angina pectoris: Secondary | ICD-10-CM

## 2024-02-20 DIAGNOSIS — I10 Essential (primary) hypertension: Secondary | ICD-10-CM

## 2024-02-20 DIAGNOSIS — F17210 Nicotine dependence, cigarettes, uncomplicated: Secondary | ICD-10-CM

## 2024-02-20 MED ORDER — FLUOXETINE HCL 20 MG PO CAPS: 20.0000 mg | ORAL_CAPSULE | Freq: Every day | ORAL | 1 refills | Status: DC

## 2024-02-20 NOTE — Telephone Encounter (Signed)
 Copied from CRM [id number]. Topic: Clinical - Medication Question >> Feb 20, 2024  9:49 AM Gustabo D wrote: Pt doesn't want prozac she wants xanax

## 2024-02-20 NOTE — Progress Notes (Signed)
 MRN : [id number]  Molly Matthews is a 73 y.o. (1950-09-16) female who presents with chief complaint of check circulation.  History of Present Illness:   The patient returns to the office for followup and review status post angiogram with intervention on 01/22/2024.   Procedure: Bilateral common iliac artery stents kissing technique Left popliteal artery stent  The patient notes improvement in the lower extremity symptoms. No interval shortening of the patient's claudication distance or rest pain symptoms. No new ulcers or wounds have occurred since the last visit.  She is also followed for carotid stenosis which is followed by ultrasound.  She has known occlusion of the left internal carotid artery and is status post successful CEA of the right   The patient denies amaurosis fugax. There is no recent history of TIA symptoms or focal motor deficits. There is no prior documented CVA.   The patient is taking enteric-coated aspirin  81 mg daily.   The patient is also followed for leg swelling. The patient first noticed the swelling remotely. The swelling is associated with pain and discoloration. The pain and swelling worsens with prolonged dependency and improves with elevation. The pain is unrelated to activity.  The patient notes that in the morning the legs are significantly improved but they steadily worsened throughout the course of the day. The patient also notes a steady worsening of the discoloration in the ankle and shin area.  Overall she states this particular issue has been stable compared to her previous visit.  Elevation makes the leg symptoms better, dependency makes them much worse. There is no history of ulcerations. The patient denies any recent changes in medications.   The patient has not been wearing graduated compression.   The patient denies a history of DVT or PE. There is no prior history of  phlebitis.   No history of malignancies. No history of trauma or groin or pelvic surgery. There is no history of radiation treatment to the groin or pelvis   There has been a significant changes to the patient's overall health care.      The patient has a history of coronary artery disease, no recent episodes of angina or shortness of breath.  There is a history of hyperlipidemia which is being treated with a statin.     Previous carotid Duplex showed RICA 40-59% (s/p right CEA) and known occlusion of the left ICA.  No significant change compared to last study in 10/13/2021   CT scanning of the chest for lung cancer screening dated 05/01/2022 is reviewed by me demonstrates a stable saccular descending thoracic aortic aneurysm measuring 3.8 cm in maximal dimension  ABI's obtained today show rt=1.08 and Lt=1.12  (previous ABI's Rt=0.81 and Lt=0.91).  There are now triphasic posterior tibial signals bilaterally  Current Meds  Medication Sig   acetaminophen  (TYLENOL ) 500 MG tablet Take 1,000 mg by mouth every 6 (six) hours as needed.   aspirin  EC 81 MG tablet Take 1 tablet (81 mg total) by mouth daily. Swallow whole.   clopidogrel  (PLAVIX ) 75 MG tablet Take 75 mg by mouth daily.  REPATHA SURECLICK 140 MG/ML SOAJ Inject 1 mL into the skin every 14 (fourteen) days.   TRELEGY ELLIPTA 100-62.5-25 MCG/ACT AEPB Inhale 1 puff into the lungs daily.   [DISCONTINUED] escitalopram  (LEXAPRO ) 10 MG tablet Take 10 mg by mouth daily. (Patient not taking: Reported on 02/20/2024)   [DISCONTINUED] escitalopram  (LEXAPRO ) 20 MG tablet Take 20 mg by mouth daily. (Patient not taking: Reported on 02/20/2024)    Past Medical History:  Diagnosis Date   Anginal pain    Anxiety    Aortic atherosclerosis    Arthritis    Cancer (HCC) 01/2017   Skin cancer on right leg   Carotid stenosis, left    100 % blockage   Cholelithiasis    Chronic enteritis    Chronic venous insufficiency    Colon polyps    COPD  (chronic obstructive pulmonary disease) (HCC)    mild   Coronary artery disease    Depression    Diastasis recti    Diverticulosis    Dyspnea    Erosive esophagitis    Fatty liver disease, nonalcoholic    Gastritis    GERD (gastroesophageal reflux disease)    Headache    Herniated lumbar intervertebral disc    Hyperlipidemia    Hypertension    IBS (irritable bowel syndrome)    Left carotid artery stenosis    Osteopenia    Peptic ulcer disease    Personal history of tobacco use, presenting hazards to health 03/14/2016   SCC (squamous cell carcinoma)    Stroke (HCC)    TIA's X 2   Tobacco dependence    Vitamin D deficiency     Past Surgical History:  Procedure Laterality Date   ABDOMINAL HYSTERECTOMY     BREAST BIOPSY Left    negative 2008   CAROTID ENDARTERECTOMY     CHOLECYSTECTOMY     COLONOSCOPY W/ POLYPECTOMY     COLONOSCOPY WITH PROPOFOL  N/A 07/26/2020   Procedure: COLONOSCOPY WITH PROPOFOL ;  Surgeon: Toledo, Ladell POUR, MD;  Location: [facility];  Service: Gastroenterology;  Laterality: N/A;   DILATION AND CURETTAGE OF UTERUS     ESOPHAGOGASTRODUODENOSCOPY (EGD) WITH PROPOFOL  N/A 01/26/2017   Procedure: ESOPHAGOGASTRODUODENOSCOPY (EGD) WITH PROPOFOL ;  Surgeon: Toledo, Ladell POUR, MD;  Location: [facility];  Service: Gastroenterology;  Laterality: N/A;   ESOPHAGOGASTRODUODENOSCOPY (EGD) WITH PROPOFOL  N/A 07/26/2020   Procedure: ESOPHAGOGASTRODUODENOSCOPY (EGD) WITH PROPOFOL ;  Surgeon: Toledo, Ladell POUR, MD;  Location: [facility];  Service: Gastroenterology;  Laterality: N/A;   HEMORRHOID SURGERY N/A 07/25/2016   Procedure: HEMORRHOIDECTOMY INTERNAL AND EXTERNAL;  Surgeon: Larinda Unknown Sharps, MD;  Location: [facility];  Service: General;  Laterality: N/A;   LOWER EXTREMITY ANGIOGRAPHY Left 01/22/2024   Procedure: Lower Extremity Angiography;  Surgeon: Jama Cordella MATSU, MD;  Location: [facility];  Service: Cardiovascular;  Laterality: Left;   LOWER  EXTREMITY INTERVENTION Left 01/22/2024   Procedure: LOWER EXTREMITY INTERVENTION;  Surgeon: Jama Cordella MATSU, MD;  Location: [facility];  Service: Cardiovascular;  Laterality: Left;   MICROLARYNGOSCOPY N/A 01/29/2019   Procedure: MICROLARYNGOSCOPY with Vocal Cord polyp removal.   No laser is needed.;  Surgeon: Blair Mt, MD;  Location: [facility];  Service: ENT;  Laterality: N/A;   VASCULAR SURGERY Right    Carotid Endarterectomy     Social History Social History   Tobacco Use   Smoking status: Every Day    Current packs/day: 1.00    Average packs/day: 1 pack/day for 45.0 years (45.0 ttl pk-yrs)  Types: Cigarettes    Passive exposure: Never   Smokeless tobacco: Never  Vaping Use   Vaping status: Former  Substance Use Topics   Alcohol use: No   Drug use: No    Family History Family History  Problem Relation Age of Onset   Breast cancer Mother 79   Atrial fibrillation Mother    Breast cancer Sister 51   Heart attack Father    Colon cancer Brother    Lung cancer Brother     Allergies  Allergen Reactions   Buspirone     Other reaction(s): Headache   Chantix [Varenicline Tartrate] Other (See Comments)    Jittery muscle movements   Statins Other (See Comments)    Muscle and joint aches - can take in small doses   Estradiol  Itching   Sulfa Antibiotics     Other reaction(s): Unknown   Zithromax [Azithromycin] Other (See Comments)    Back Pain   Elemental Sulfur Rash     REVIEW OF SYSTEMS (Negative unless checked)  Constitutional: [] Weight loss  [] Fever  [] Chills Cardiac: [] Chest pain   [] Chest pressure   [] Palpitations   [] Shortness of breath when laying flat   [] Shortness of breath with exertion. Vascular:  [x] Pain in legs with walking   [] Pain in legs at rest  [] History of DVT   [] Phlebitis   [] Swelling in legs   [] Varicose veins   [] Non-healing ulcers Pulmonary:   [] Uses home oxygen   [] Productive cough   [] Hemoptysis   [] Wheeze  [] COPD    [] Asthma Neurologic:  [] Dizziness   [] Seizures   [] History of stroke   [] History of TIA  [] Aphasia   [] Vissual changes   [] Weakness or numbness in arm   [] Weakness or numbness in leg Musculoskeletal:   [] Joint swelling   [] Joint pain   [] Low back pain Hematologic:  [] Easy bruising  [] Easy bleeding   [] Hypercoagulable state   [] Anemic Gastrointestinal:  [] Diarrhea   [] Vomiting  [] Gastroesophageal reflux/heartburn   [] Difficulty swallowing. Genitourinary:  [] Chronic kidney disease   [] Difficult urination  [] Frequent urination   [] Blood in urine Skin:  [] Rashes   [] Ulcers  Psychological:  [] History of anxiety   []  History of major depression.  Physical Examination  Vitals:   02/11/24 1545  BP: (!) 107/51  Pulse: 77  Resp: 16  Weight: 135 lb 6.4 oz (61.4 kg)  Height: 5' 2 (1.575 m)   Body mass index is 24.76 kg/m. Gen: WD/WN, NAD Head: [place]/AT, No temporalis wasting.  Ear/Nose/Throat: Hearing grossly intact, nares w/o erythema or drainage Eyes: PER, EOMI, sclera nonicteric.  Neck: Supple, no masses.  No bruit or JVD.  Pulmonary:  Good air movement, no audible wheezing, no use of accessory muscles.  Cardiac: RRR, normal S1, S2, no Murmurs. Vascular:  mild trophic changes, no open wounds Vessel Right Left  Radial Palpable Palpable  PT Trace palpable Trace palpable  DP Not Palpable Not Palpable  Gastrointestinal: soft, non-distended. No guarding/no peritoneal signs.  Musculoskeletal: M/S 5/5 throughout.  No visible deformity.  Neurologic: CN 2-12 intact. Pain and light touch intact in extremities.  Symmetrical.  Speech is fluent. Motor exam as listed above. Psychiatric: Judgment intact, Mood & affect appropriate for pt's clinical situation. Dermatologic: No rashes or ulcers noted.  No changes consistent with cellulitis.   CBC Lab Results  Component Value Date   WBC 8.4 11/22/2023   HGB 16.5 (H) 11/22/2023   HCT 49.3 (H) 11/22/2023   MCV 96 11/22/2023   PLT 260 11/22/2023  BMET    Component Value Date/Time   NA 145 (H) 11/22/2023 0819   NA 142 01/29/2014 2124   K 4.1 11/22/2023 0819   K 4.0 01/29/2014 2124   CL 106 11/22/2023 0819   CL 110 (H) 01/29/2014 2124   CO2 21 11/22/2023 0819   CO2 25 01/29/2014 2124   GLUCOSE 109 (H) 11/22/2023 0819   GLUCOSE 183 (H) 03/16/2022 0448   GLUCOSE 137 (H) 01/29/2014 2124   BUN 16 01/22/2024 0723   BUN 10 11/22/2023 0819   BUN 10 01/29/2014 2124   CREATININE 0.74 01/22/2024 0723   CREATININE 1.00 01/29/2014 2124   CALCIUM  9.9 11/22/2023 0819   CALCIUM  9.3 01/29/2014 2124   GFRNONAA >60 01/22/2024 0723   GFRNONAA 59 (L) 01/29/2014 2124   GFRNONAA >60 12/22/2013 1159   GFRAA >60 04/25/2018 1713   GFRAA >60 01/29/2014 2124   GFRAA >60 12/22/2013 1159   CrCl cannot be calculated (Patient's most recent lab result is older than the maximum 21 days allowed.).  COAG Lab Results  Component Value Date   INR 1.0 11/15/2021   INR 1.1 12/31/2020    Radiology VAS US  ABI WITH/WO TBI Result Date: 02/11/2024  LOWER EXTREMITY DOPPLER STUDY Patient Name:  CLEVIE PROUT  Date of Exam:   02/11/2024 Medical Rec #: [id number]        Accession #:    [id number] Date of Birth: [birth year]         Patient Gender: F Patient Age:   25 years Exam Location:  [facility] Procedure:      VAS US  ABI WITH/WO TBI Referring Phys: [facility] --------------------------------------------------------------------------------  Indications: Claudication. High Risk Factors: Hypertension, coronary artery disease.  Vascular Interventions: 01/22/2024 Bilat CIA kissing stents, Left popliteal                         stent. Comparison Study: 12/17/2023 Performing Technologist: Jerel Croak RVT  Examination Guidelines: A complete evaluation includes at minimum, Doppler waveform signals and systolic blood pressure reading at the level of bilateral brachial, anterior tibial, and posterior tibial arteries, when vessel segments are accessible.  Bilateral testing is considered an integral part of a complete examination. Photoelectric Plethysmograph (PPG) waveforms and toe systolic pressure readings are included as required and additional duplex testing as needed. Limited examinations for reoccurring indications may be performed as noted.  ABI Findings: +---------+------------------+-----+---------+--------+ Right    Rt Pressure (mmHg)IndexWaveform Comment  +---------+------------------+-----+---------+--------+ Brachial 130                                      +---------+------------------+-----+---------+--------+ PTA      141               1.08 triphasic         +---------+------------------+-----+---------+--------+ DP       133               1.02 biphasic          +---------+------------------+-----+---------+--------+ Great Toe100               0.77 Normal            +---------+------------------+-----+---------+--------+ +---------+------------------+-----+---------+-------+ Left     Lt Pressure (mmHg)IndexWaveform Comment +---------+------------------+-----+---------+-------+ Brachial 122                                     +---------+------------------+-----+---------+-------+  PTA      145               1.12 triphasic        +---------+------------------+-----+---------+-------+ DP       139               1.07 biphasic         +---------+------------------+-----+---------+-------+ Great Toe103               0.79 Normal           +---------+------------------+-----+---------+-------+ +-------+-----------+-----------+------------+------------+ ABI/TBIToday's ABIToday's TBIPrevious ABIPrevious TBI +-------+-----------+-----------+------------+------------+ Right  1.08       .77        .81         .28          +-------+-----------+-----------+------------+------------+ Left   1.12       .79        .91         .29           +-------+-----------+-----------+------------+------------+  Bilateral ABIs and TBIs appear increased compared to prior study on 11/2023.  Summary: Right: Resting right ankle-brachial index is within normal range. The right toe-brachial index is normal.  Left: Resting left ankle-brachial index is within normal range. The left toe-brachial index is normal.  *See table(s) above for measurements and observations.  Electronically signed by [name] MD on 02/11/2024 at 4:18:48 PM.    Final    PERIPHERAL VASCULAR CATHETERIZATION Result Date: 01/22/2024 See surgical note for result.    Assessment/Plan 1. Atherosclerosis of native artery of right lower extremity with intermittent claudication (Primary) Recommend:  The patient is status post successful angiogram with intervention.  The patient reports that the claudication symptoms and leg pain has improved.   The patient denies lifestyle limiting changes at this point in time.  No further invasive studies, angiography or surgery at this time. The patient should continue walking and begin a more formal exercise program.  The patient should continue antiplatelet therapy and aggressive treatment of the lipid abnormalities  Continued surveillance is indicated as atherosclerosis is likely to progress with time.    Patient should undergo noninvasive studies as ordered. The patient will follow up with me to review the studies.   2. Bilateral carotid artery stenosis Recommend:   Given the patient's asymptomatic subcritical stenosis no further invasive testing or surgery at this time.   Previous carotid Duplex showed RICA 40-59% (s/p right CEA) and known occlusion of the left ICA.  No significant change compared to last study in 10/13/2021   Continue antiplatelet therapy as prescribed Continue management of CAD, HTN and Hyperlipidemia Healthy heart diet,  encouraged exercise at least 4 times per week Follow up in 12 months with duplex ultrasound  and physical exam     2. Atherosclerosis of native artery of right lower extremity with intermittent claudication (HCC) Recommend:   Patient should undergo arterial duplex of the lower extremity because there has been a significant deterioration in the patient's lower extremity symptoms.  The patient states they are having increased pain and a marked decrease in the distance that they can walk.   The risks and benefits as well as the alternatives were discussed in detail with the patient.  All questions were answered.  Patient agrees to proceed and understands this could be a prelude to angiography and intervention.   The patient will follow up with me in the office to review the studies.   3. Aneurysm of descending thoracic  aorta without rupture Recommend:   No surgery or intervention is indicated at this time.   The patient has an asymptomatic thoracic aortic aneurysm that is less than 6.0 cm in maximal diameter.  I have discussed the natural history of thoracic aortic aneurysm and the small risk of rupture for aneurysm less than 6.5 cm in size.  However, as these small aneurysms tend to enlarge over time, continued surveillance with CT scan is mandatory.    I have also discussed optimizing medical management with hypertension and lipid control and the importance of abstinence from tobacco.  The patient is also encouraged to exercise a minimum of 30 minutes 4 times a week.    Should the patient develop new onset chest or back pain or signs of peripheral embolization they are instructed to seek medical attention immediately and to alert the physician providing care that they have an aneurysm in the chest.    The patient voices their understanding.   The patient will return as ordered with a CT scan of the chest, at this point planning on ordering a CT in the second half of 2026  4. Chronic venous insufficiency Recommend:   I have had a long discussion with the patient regarding swelling  and why it  causes symptoms.  Patient will begin wearing graduated compression on a daily basis a prescription was given. The patient will  wear the stockings first thing in the morning and removing them in the evening. The patient is instructed specifically not to sleep in the stockings.    In addition, behavioral modification will be initiated.  This will include frequent elevation, use of over the counter pain medications and exercise such as walking.   Consideration for a lymph pump will also be made based upon the effectiveness of conservative therapy.  This would help to improve the edema control and prevent sequela such as ulcers and infections   5. Chronic obstructive pulmonary disease, unspecified COPD type (HCC) Continue pulmonary medications and aerosols as already ordered, these medications have been reviewed and there are no changes at this time.   6. Mixed hyperlipidemia Continue statin as ordered and reviewed, no changes at this time    [name], MD  02/20/2024 12:40 PM

## 2024-02-20 NOTE — Progress Notes (Signed)
 Established Patient Office Visit  Introduced to nurse practitioner role and practice setting.  All questions answered.  Discussed provider/patient relationship and expectations.   Subjective   Patient ID: Molly Matthews, female    DOB: [birth year]  Age: 73 y.o. MRN: [id number]  Chief Complaint  Patient presents with   Medical Management of Chronic Issues    Reports stents being placed in legs about 5 weeks ago. 2 in left and 1 in the right    Hypertension    She reports monitoring at home with various readings. She reports no symptoms. She does smoke.     Discussed the use of AI scribe software for clinical note transcription with the patient, who gave verbal consent to proceed.  History of Present Illness Molly Matthews is a 73 year old female with depression and anxiety who presents with mood instability and medication management.  She experiences significant mood instability, characterized by depression and anxiety. She feels 'nervous all the time' and 'going crazy,' with frequent crying and being upset nearly every day. Her mood issues have been exacerbated by family stressors, including her sister's hospitalization and her role as a primary problem-solver in her family. She feels she is 'never happy' and 'very seldom smiles.'  She was previously on Lexapro , initially at 20 mg, which was increased to 30 mg, but she discontinued it about a week ago due to perceived ineffectiveness. She is interested in trying a different medication, mentioning Prozac and Valium. She has not been able to see a therapist due to insurance issues but has spoken with her preacher about finding someone to talk to.  No panic attacks, but she describes a generalized state of anxiety and depression. She is overwhelmed by family responsibilities and worries about her granddaughter and grandson. She has not been able to access mental health services due to distance and insurance limitations.  In terms of her  cardiovascular history, she has had stents placed in both legs and reports no postoperative concerns. She follows up with vascular surgery and has an appointment scheduled for January. She is on Repatha, aspirin , and Plavix  for her cardiovascular health and has discontinued Lipitor.  She is trying to quit smoking, currently smoking about a pack a day. She has tried patches without success and cannot take Chantix due to side effects. She has ordered a non-nicotine  product online but is unsure of its efficacy.         02/20/2024    8:55 AM 11/20/2023   11:07 AM 10/26/2023   10:23 AM  Depression screen PHQ 2/9  Decreased Interest 2 2 2   Down, Depressed, Hopeless 2 2 2   PHQ - 2 Score 4 4 4   Altered sleeping 1 0 0  Tired, decreased energy 2 2 2   Change in appetite 1 0 0  Feeling bad or failure about yourself  2 2 2   Trouble concentrating 3 3 2   Moving slowly or fidgety/restless 2 1 2   Suicidal thoughts 1 0 0  PHQ-9 Score 16 12 12   Difficult doing work/chores Somewhat difficult Somewhat difficult Somewhat difficult       02/20/2024    8:55 AM 11/20/2023   11:08 AM 10/26/2023   10:23 AM  GAD 7 : Generalized Anxiety Score  Nervous, Anxious, on Edge 3 3 2   Control/stop worrying 3 3 2   Worry too much - different things 2 3 2   Trouble relaxing 3 3 2   Restless 2 2 2   Easily annoyed or irritable 2  2 2  Afraid - awful might happen 3 3 3   Total GAD 7 Score 18 19 15   Anxiety Difficulty Somewhat difficult Very difficult Somewhat difficult    ROS  Negative unless indicated in HPI   Objective:     BP (!) 126/58 (BP Location: Right Arm, Patient Position: Sitting, Cuff Size: Normal)   Pulse 92   Wt 139 lb 1.6 oz (63.1 kg)   SpO2 98%   BMI 25.44 kg/m    Physical Exam Constitutional:      General: She is not in acute distress.    Appearance: Normal appearance. She is not ill-appearing, toxic-appearing or diaphoretic.  HENT:     Head: Normocephalic.     Ears:     Comments: Hard  of hearing    Nose: Nose normal.     Mouth/Throat:     Mouth: Mucous membranes are moist.     Pharynx: Oropharynx is clear.  Eyes:     Extraocular Movements: Extraocular movements intact.     Pupils: Pupils are equal, round, and reactive to light.  Cardiovascular:     Rate and Rhythm: Normal rate and regular rhythm.     Pulses: Normal pulses.          Radial pulses are 2+ on the right side and 2+ on the left side.       Dorsalis pedis pulses are 2+ on the right side and 2+ on the left side.       Posterior tibial pulses are 2+ on the right side and 2+ on the left side.     Heart sounds: Normal heart sounds. No murmur heard.    No friction rub. No gallop.  Pulmonary:     Effort: No respiratory distress.     Breath sounds: No stridor. No wheezing, rhonchi or rales.  Chest:     Chest wall: No tenderness.  Musculoskeletal:     Right lower leg: No edema.     Left lower leg: No edema.  Skin:    General: Skin is warm and dry.     Capillary Refill: Capillary refill takes less than 2 seconds.  Neurological:     General: No focal deficit present.     Mental Status: She is alert and oriented to person, place, and time. Mental status is at baseline.     Cranial Nerves: No cranial nerve deficit.     Motor: No weakness.     Gait: Gait normal.  Psychiatric:        Attention and Perception: Attention normal.        Mood and Affect: Mood is anxious and depressed. Affect is tearful.        Behavior: Behavior normal.        Thought Content: Thought content normal.        Judgment: Judgment normal.      No results found for any visits on 02/20/24.    The ASCVD Risk score (Arnett DK, et al., 2019) failed to calculate for the following reasons:   Risk score cannot be calculated because patient has a medical history suggesting prior/existing ASCVD    Assessment & Plan:  There are no diagnoses linked to this encounter.   Assessment and Plan Assessment & Plan Major Depression, Severe,  Single episode Chronic depression and anxiety significantly impacting daily life. Previous Lexapro  treatment was ineffective- self stopped a little over a week ago. Symptoms include persistent nervousness, crying, and feeling overwhelmed. Complicated by traumatic loss of her  grandson a few months ago. No panic attacks reported- appears constant. Discussed potential benefits of starting a new SSRI, such as Prozac or Zoloft, with 4-6 weeks follow to assess effectiveness. Emphasized the importance of CBT and counseling to complement medication. She expressed interest in starting Prozac for daily maintenance medication, l and engaging in counseling. - Was previous referred to [facility] - Prescribe fluoxetine (Prozac) 20 mg daily  - Refer to [facility]  - Discussed option to refer to psychiatry for management given severity - will hold for now based on [facility] appointment. - Schedule follow-up in 4 weeks to assess mood and medication effectiveness - benzos are not an appropriate option for patient for daily maintenance.   Patient and/or legal guardian verbally consented to [facility] services about presenting concerns and psychiatric consultation as appropriate.  The services will be billed as appropriate for the patient   Peripheral artery disease with bilateral lower extremity stents Status post bilateral lower extremity stent placement. No postoperative concerns reported. - Follow-up with vascular surgery scheduled for January  Cigarette nicotine  dependence Chronic tobacco use, approximately one pack per day. Previous attempts to quit were unsuccessful. Discussed risks of using unverified smoking cessation products and advised against using products that involve inhaling substances. She has tried patches and Chantix in the past without success. - Encourage behavioral changes to reduce smoking - Recommend contacting the smoking cessation hotline for  support  Hyperlipidemia w/ PAD & CAD - aspirin   - managed with Repatha.  - No current use of statins due to previous adverse effects.  - recent popliteal stents placed bilaterally - pulses + 2 DP & PT - vascular surgery following  Hypertension Chronic, controlled Continue amlodipine   Chronic obstructive pulmonary disease (COPD) - chronic, controlled - continue trelegy ellipta - prn albuterol       No follow-ups on file.    Curtis DELENA Boom, FNP

## 2024-02-20 NOTE — Telephone Encounter (Signed)
**Note De-identified  [place] Obfuscation** Please advise 

## 2024-03-19 ENCOUNTER — Ambulatory Visit: Admitting: Family Medicine

## 2024-03-31 ENCOUNTER — Ambulatory Visit: Admitting: Licensed Clinical Social Worker

## 2024-03-31 DIAGNOSIS — F331 Major depressive disorder, recurrent, moderate: Secondary | ICD-10-CM

## 2024-03-31 DIAGNOSIS — F411 Generalized anxiety disorder: Secondary | ICD-10-CM

## 2024-03-31 NOTE — BH Specialist Note (Signed)
 Collaborative Care Initial Assessment   Pt name: Molly Matthews MRN# [id number]   Date: 03/31/24  Session Start time 1300 Session End time: 1400 Total time in minutes: 60  Encounter Diagnoses  Name Primary?   MDD (major depressive disorder), recurrent episode, moderate (HCC) Yes   Generalized anxiety disorder      Type of Contact:  in person   Patient consent obtained:  Yes  Patient and/or legal guardian verbally consented to [facility] services about presenting concerns and psychiatric consultation as appropriate.  The services will be billed as appropriate for the patient   Types of Service: Comprehensive Clinical Assessment (CCA), Collaborative care, and Health & Behavioral Assessment/Intervention  Summary  Molly Matthews is a 72 y.o. female  with history of grief, MDD, anxiety seen in consultation at the request of Curtis Boom FNP for establishment of [facility] Collaborative Care management. Pt is currently taking the following psychiatric medications: Prozac  20 mg.  Patient did call her PCP requesting a as needed anxiety medication.  PCP recommended hydroxyzine or BuSpar at that time, patient stated she wanted Xanax.  Patient reports that she is not taking the medication as prescribed.. Current symptoms include: Little interest or pleasure in doing things, feeling down depressed or hopeless, trouble falling asleep, feeling bad about self, trouble concentrating and focusing, suicidal ideation (with no plan or intent to follow through).  Patient denies HI or AVH.Molly Matthews Pt reports daily vaping of THC, and smoking cigarettes daily.  Patient denies any EtOH.    Reason for referral in patient/family's own words:   Get some additional help for how I am feeling right now  Patient's goal for today's visit: Establish [facility] Collaborative Care  History of Present illness:    History of present illness:  Molly Matthews reports that they have a history of sadness,  grief, and anxiety since May 2025 and have had the following treatments: Prozac  20.  Pt reports concerns about medical history including hypertension, carotid stenosis, history of CVA, aortic aneurysm, angina, coronary artery disease, COPD, fatty liver, hernia, herniated disks, and partial hearing impairment.  Pt reports that current external stressors include the traumatic loss of her 72-year-old grandson after he accidentally shot himself in May 2025.  Patient reports that she was very close to this child because she was his primary caregiver Monday through Friday since he was born..  Pt feels that symptoms of stress, depression, and anxiety are impacting everyday functioning including sleep quality/quantity, social interaction, ability to engage in recreational activities, impacting appetite, and ability to engage with tasks both inside and outside of the home. Molly Matthews reports that her husband and her best friend are primary support system at time of assessment.   Pt feels IBH collaborative care team--counseling and medication management would be something to assist in their overall symptom management.   Clinical Assessments (PHQ-9 and GAD-7)  PHQ-9 Assessments:     03/31/2024    2:00 PM 02/20/2024    8:55 AM 11/20/2023   11:07 AM  Depression screen PHQ 2/9  Decreased Interest 2 2 2   Down, Depressed, Hopeless 2 2 2   PHQ - 2 Score 4 4 4   Altered sleeping 1 1 0  Tired, decreased energy 3 2 2   Change in appetite 0 1 0  Feeling bad or failure about yourself  2 2 2   Trouble concentrating 3 3 3   Moving slowly or fidgety/restless 2 2 1   Suicidal thoughts 1 1 0  PHQ-9 Score  16 16  12    Difficult doing work/chores Not difficult at all Somewhat difficult Somewhat difficult     Data saved with a previous flowsheet row definition     GAD-7 Assessments:     03/31/2024    2:01 PM 02/20/2024    8:55 AM 11/20/2023   11:08 AM 10/26/2023   10:23 AM  GAD 7 : Generalized Anxiety Score   Nervous, Anxious, on Edge 2 3 3 2   Control/stop worrying 3 3 3 2   Worry too much - different things 2 2 3 2   Trouble relaxing 3 3 3 2   Restless 2 2 2 2   Easily annoyed or irritable 3 2 2 2   Afraid - awful might happen 3 3 3 3   Total GAD 7 Score 18 18 19 15   Anxiety Difficulty Not difficult at all Somewhat difficult Very difficult Somewhat difficult      Social History:  Household:  pt, husband Marital status:  married for 20 years Number of Children:  2 children, 4 grandchildren, 4 great grandchildren Employment:  retired  Education:  high school graduate  Psychiatric Review of systems: Insomnia: fluctuating. Hard to fall asleep  Changes in appetite: none Decreased need for sleep: No Family history of bipolar disorder: No Hallucinations: No   Paranoia: No    Psychotropic medications: Pt has rx of fluoxetine  20 mg--pt is not taking the medication.  Patient made the statement  everybody told me I did not need to be taking that stuff  Current medications: Current Outpatient Medications on File Prior to Visit  Medication Sig Dispense Refill   acetaminophen  (TYLENOL ) 500 MG tablet Take 1,000 mg by mouth every 6 (six) hours as needed.     albuterol  (PROVENTIL  HFA;VENTOLIN  HFA) 108 (90 Base) MCG/ACT inhaler Inhale 2 puffs into the lungs every 6 (six) hours as needed for wheezing or shortness of breath.     albuterol  (PROVENTIL ) (2.5 MG/3ML) 0.083% nebulizer solution Take 3 mLs (2.5 mg total) by nebulization every 6 (six) hours as needed for wheezing or shortness of breath. 75 mL 0   amLODipine  (NORVASC ) 5 MG tablet Take 1 tablet (5 mg total) by mouth daily. 30 tablet 5   aspirin  EC 81 MG tablet Take 1 tablet (81 mg total) by mouth daily. Swallow whole. 150 tablet 1   clopidogrel  (PLAVIX ) 75 MG tablet Take 75 mg by mouth daily.     FLUoxetine  (PROZAC ) 20 MG capsule Take 1 capsule (20 mg total) by mouth daily. 90 capsule 1   pantoprazole  (PROTONIX ) 40 MG tablet Take 40 mg by mouth  daily.     REPATHA SURECLICK 140 MG/ML SOAJ Inject 1 mL into the skin every 14 (fourteen) days.     TRELEGY ELLIPTA 100-62.5-25 MCG/ACT AEPB Inhale 1 puff into the lungs daily.     No current facility-administered medications on file prior to visit.     Patient taking medications as prescribed:  No--patient made the decision to discontinue the Prozac  Side effects reported: No   Psychiatric History  Have you ever been treated for a mental health problem? Yes If Yes, when were you treated and whom did you see (psychiatrist/counselor) ?        When May 2025              name of provider primary care physician    Psychiatric History  Depression: Yes Anxiety: Yes Mania: No Psychosis: No PTSD symptoms: Yes  Past Psychiatric History/Hospitalization(s): Hospitalization for psychiatric illness: No Prior Suicide  Attempts: No Prior Self-injurious behavior: No  Have you ever had thoughts of harming yourself or others or attempted suicide? No plan to harm self or others.  Patient reports that she does have fleeting suicidal thoughts, with no plans or intent to follow through.  Patient states that she does have a firearm in her home that makes her feel uncomfortable, so she requested that her husband walk the firearm up so she does not have access.  Traumatic Experiences: History or current traumatic events (natural disaster, house fire, etc.)? Yes--sudden loss of 55-year-old grandchild when he found a loaded gun in the car and shot himself History or current physical trauma?  no History or current emotional trauma?  yes History or current sexual trauma?  no History or current domestic or intimate partner violence?  no   Alcohol and/or Substance Use History   Tobacco Alcohol Other substances  Current use Cigarettes daily (AUDIT-C screening) Pt denies THC daily (vape)  Past use Cigarettes daily Pt denies Pt just started vaping/smoking in May  Past treatment Pt denies Pt denies Pt denies       Withdrawal Potential: none  [place] Suicide Severity Rating Scale:  Loss Adjuster, Chart[facility] Integrated [facility] from 03/31/2024 in [facility] ED to Hosp-Admission (Discharged) from 03/15/2022 in [facility] MEDICAL TELEMETRY ED to Hosp-Admission (Discharged) from 11/15/2021 in [facility] MEDICAL TELEMETRY  C-SSRS RISK CATEGORY Low Risk No Risk No Risk     Guns in the home (secured):  yes   The patient demonstrates the following risk factors for suicide: Chronic risk factors for suicide include: N/A. Acute risk factors for suicide include: N/A. Protective factors for this patient include: responsibility to others (children, family), hope for the future, and religious beliefs against suicide. Considering these factors, the overall suicide risk at this point appears to be low. Patient is appropriate for outpatient follow up.  Danger to Others Risk Assessment Danger to others risk factors:  NONE Patient endorses recent thoughts of harming others:  Pt denies Dynamic Appraisal of Situational Aggression (DASA): NONE  BH Counselor discussed emergency crisis plan with client and provided local emergency services resources.  Mental status exam:   General Appearance Molly Matthews:  Neat Eye Contact:  Good Motor Behavior:  Restlestness Speech:  Normal Level of Consciousness:  Alert Mood:  Anxious and Depressed Affect:  Depressed and Tearful Anxiety Level:  Moderate Thought Process:  Coherent Thought Content:  WNL Perception:  Normal Judgment:  Good Insight:  Present  Diagnosis: Encounter Diagnoses  Name Primary?   MDD (major depressive disorder), recurrent episode, moderate (HCC) Yes   Generalized anxiety disorder       Goals: Increase healthy adjustment to current life circumstances and Begin healthy grieving over loss   Interventions: Mindfulness or Relaxation Training, Medication Monitoring, and  Supportive Counseling   Follow-up Plan: IBH collaborative care team.  Referrals as needed  Tawni SAUNDERS Toye Rouillard, LCSW  Assessment completed by Tawni Brisker, MSW, LCSW  on 03/31/24

## 2024-03-31 NOTE — Patient Instructions (Signed)
 Using Behavioral Activation to manage stress/depression symptoms     Identify/understand your own mood triggers.   Structure your day--get up around the same time, eat meals/snacks around the same time, go to bed around the same time.   Purposefully schedule self care time and time to complete tasks. This can include quiet time.  Stimulate your brain--go for a walk, text/call a friend or family member, if you are indoors--go outside (and vice versa), go for a drive, go to a store with bright colors and bright lights. Try to do things in a different way--drive to your favorite places using an alternative route, or instead of starting on the right side of the grocery store when shopping, start on the left side. You might feel a bit uncomfortable doing things outside of the comfort zone, but this is helping the brain create new neural pathways and is very healthy for brain/emotional health.   Physical movement based on your ability. If you can go for a walk, do stretches, even waving your hands to music can trigger feel-good endorphins in the brain and help release physical tension we all hold in our bodies.  Even 5 minutes can make a difference.   Be intentional about doing things that bring you joy (or used to bring you joy), and look for the things in every day that make you happy.  Seek those glimmers of joy each day.  Set a timer for 5 minutes for a harder task (ex. Laundry, washing dishes).  Allow yourself to work distraction-free for 5 minutes, then stop when the timer goes off. If you need a break, take a break. If you want to continue working then set another timer for whatever time you choose.   Limit or eliminate substance use including alcohol, marijuana, or recreational use of prescription medication.  Let in the light!! Open the window blinds, curtains and let natural light in. Even sitting near a window or sitting outside can boost your mood, especially in the wintertime when  there is less daylight.   If you take medication to manage symptoms, remember to take all medications as they are prescribed (please read all labels!!)    Things to envision for ourselves to to improve inspiration, motivation, and initiative :  improving physical wellness, focus on family relationships, focusing on our own mental/emotional well being, being a part of a bigger community, finding a hobby, being a part of something that fosters personal growth, engaging socially with others (even digitally!!!)   __________________________________________  ANXIETY/PANIC EPISODE MANAGEMENT (CBT/MINDFULNESS BASED)   If you are in a highly stimulating or triggering environment, change your location to a less stimulating or safer environment .   Stimulate your senses by tasting/eating a sour candy (such as a lemon drop) or a strong flavored cough drop.  This triggers smell, taste, and touch since we  have had lots of nerve endings inside of your mouth.   Practice slow, controlled breathing to avoid hyperventilation.  Breathing into your nose for the count of 4 inside your head, holding your breath for the count of 4 inside your head, and exhale slowly counting to 8 inside your head.  This is called 4-4-8 breathing, or triangle breathing. (Controlled breathing). This helps manage panic, anxiety, anger, and tearfulness.  Additional grounding exercises include rubbing your hands softly together, or wiggling toes inside your shoes, pretending to grasp the floor with your toes.    Listen to calming music or sounds  Count backwards in your head  by twos or tens or recite multiplication tables in your head.  Visualize a calming, happy place and identify 5 explicit details about this place (color, temperature, smells, visual details, etc.)  Splash cold water on your face or hold an ice cube in your hand (alternate hands)  Clench and release muscle groups (hands, shoulders, facial muscles)  Engage in  soothing activities to recover after a stressful/anxious episode such as: drinking a warm beverage, sitting in your favorite comfortable location, positive physical contact with a pet or weighted blanket, using positive self talk/positive affirmations.   Download PTSD Coach app for your phone or tablet--its free and has lots of tips to help you manage panic episodes no matter where you are!    _________________________________ Acute Grief Worksheet  Recognizing Acute Grief Symptoms Emotional Symptoms  Intense sadness or crying spells  Anger or irritability  Anxiety or fear  Guilt or regret  Feeling overwhelmed or hopeless  Numbness or disbelief  Physical Symptoms  Fatigue or low energy  Changes in appetite  Sleep disturbances (insomnia or oversleeping)  Headaches or body aches  Increased heart rate or blood pressure  Cognitive Symptoms  Difficulty concentrating  Forgetfulness  Intrusive thoughts about the loss  Confusion or disorientation  Behavioral Symptoms  Social withdrawal  Avoidance of reminders of the loss  Restlessness or pacing  Crying unexpectedly  Strategies to Manage Acute Grief Choose strategies that feel most helpful to you:  Emotional Coping  Talk to someone  Share your feelings with a trusted friend, therapist, or support group.  Journal your thoughts  Writing can help process emotions.  Allow yourself to feel  Accept that grief is a natural response to loss.  Physical Self-Care  Maintain a routine  Regular sleep, meals, and movement can stabilize your mood.  Exercise gently  Walking, yoga, or stretching can reduce stress.  Eat nourishing foods  Avoid alcohol, marijuana, excessive caffeine, or sugar.  Social Support  Join a grief support group  Community groups often offer structured support.  Reach out to family or community  Connection helps reduce isolation.  Attend memorials or rituals  Honoring your loved  one can bring closure.  Meaning-Making & Acceptance  Create a tribute  A photo album, letter, or art project in memory of your loved one.  Read grief literature  Books like 'On Grief & Grieving' or 'I Wasnt Ready to Say Goodbye'.  Practice mindfulness or prayer  Helps center your thoughts and emotions.  When to Seek Help If you experience any of the following, consider higher level of professional support:  Persistent grief lasting more than 12 months  Thoughts of self-harm or suicide  Inability to function in daily life  Substance misuse or severe depression ______________________________________      Emergency Resources:  National Suicide & Crisis Lifeline: Call or text 988  Crisis Text Line: Text HOME to 618-814-5927  [facility]  [address], [place], [place] [address] [phone] or [phone] WALK-IN URGENT CARE 24/7 FOR ANYONE [address], [place], [place]  [phone] Fax: [phone] [email or web address] *Interpreters available *Accepts all insurance and uninsured for Urgent Care needs *Accepts Medicaid and uninsured for outpatient treatment (below)

## 2024-04-02 ENCOUNTER — Telehealth: Payer: Self-pay | Admitting: Licensed Clinical Social Worker

## 2024-04-02 DIAGNOSIS — F411 Generalized anxiety disorder: Secondary | ICD-10-CM

## 2024-04-02 DIAGNOSIS — F331 Major depressive disorder, recurrent, moderate: Secondary | ICD-10-CM

## 2024-04-02 NOTE — BH Specialist Note (Signed)
 Attestation signed by Jacquetta Sharlot GRADE, NP at 04/02/2024  8:14 AM   Attestation signed by Warren Jacquetta, PMHNP, DNP 04/02/2024 7:29 AM   Collaborative Care Psychiatric Consultant Case Review   Assessment/Provisional Diagnosis 73 year old female with history of multiple medical issues including a CVA. The patient is referred for anxiety and depression.   Provisional Diagnosis: # MDD, Recurrent, moderately severe # GAD   Recommendation 1. Recommend Lexapro  10 mg daily and propranolol 10 mg BID PRN anxiety 2. Recommend a recent vitamin D level with supplementation if needed due to history of vitamin D deficiency 3. BH specialist to follow up.   Thank you for your consult. Please contact our collaborative care team for any questions or concerns.   I spent 20 minutes chart reviewing, discussing with [facility] Speicalist and documenting in the chart.   The above treatment considerations and suggestions are based on consultation with the [facility] specialist and/or PCP and a review of information available in the shared registry and the patient's Electronic Health Record (EHR). I have not personally examined the patient. All recommendations should be implemented with consideration of the patient's relevant prior history and current clinical status. Please feel free to call me with any questions about the care of this patient.   Virtual Behavioral Health Treatment Plan Team Note  MRN: [id number] NAME: Molly Matthews  DATE: 04/02/24  Start time: Start Time: 0800 End time: Stop Time: 0820 Total time: Total Time in Minutes (Visit): 20  Total number of Virtual BH Treatment Team Plan encounters: 1/4  Treatment Team Attendees: Nickie Deren, LCSW and Sharlot Jacquetta, DNP  Diagnoses:    ICD-10-CM   1. MDD (major depressive disorder), recurrent episode, moderate (HCC)  F33.1     2. Generalized anxiety disorder  F41.1       Goals, Interventions and Follow-up Plan Goals: Increase healthy adjustment to  current life circumstances Begin healthy grieving over loss Interventions: Mindfulness or Relaxation Training Medication Monitoring Supportive Counseling  Medication Management Recommendations: Recommend Lexapro  10 mg daily and propranolol 10 mg BID PRN anxiety   Follow-up Plan: IBH collaborative care team.  Referrals as needed.  IBH clinician discussed recommendations w/ pt and pt is ready to start any medications recommended  History of the present illness Presenting Problem/Current Symptoms:  Molly Matthews is a 73 y.o. female  with history of grief, MDD, anxiety seen in consultation at the request of Curtis Boom FNP for establishment of [facility] Collaborative Care management. Pt is currently taking the following psychiatric medications: Prozac  20 mg.  Patient did call her PCP requesting a as needed anxiety medication.  PCP recommended hydroxyzine or BuSpar at that time, patient stated she wanted Xanax.  Patient reports that she is not taking the medication as prescribed.. Current symptoms include: Little interest or pleasure in doing things, feeling down depressed or hopeless, trouble falling asleep, feeling bad about self, trouble concentrating and focusing, suicidal ideation (with no plan or intent to follow through).  Patient denies HI or AVH.SABRA Pt reports daily vaping of THC, and smoking cigarettes daily.  Patient denies any EtOH.   Psychiatric History  Depression: Yes Anxiety: Yes Mania: No Psychosis: No PTSD symptoms: Yes   Past Psychiatric History/Hospitalization(s): Hospitalization for psychiatric illness: No Prior Suicide Attempts: No Prior Self-injurious behavior: No   Have you ever had thoughts of harming yourself or others or attempted suicide? No plan to harm self or others.  Patient reports that she does have fleeting suicidal thoughts, with no  plans or intent to follow through.  Patient states that she does have a firearm in her home that makes her feel uncomfortable, so  she requested that her husband lock the firearm up so she does not have access.  Psychosocial stressors Flowsheet Row Integrated Behavioral Health from 03/31/2024 in [facility]  Current Stressors Family conflict, Family death, Grief/losses  Familial Stressors None  Sleep Difficulty falling asleep  Appetite No problems  Coping ability Overwhelmed  Patient taking medications as prescribed No    Self-harm Behaviors Risk Assessment Flowsheet Row Integrated Behavioral Health from 03/31/2024 in [facility]  Self-harm risk factors Loss (financial/interpersonal/professional), Social withdrawal/isolation  Have you recently had any thoughts about harming yourself? Yes    Screenings PHQ-9 Assessments:     03/31/2024    2:00 PM 02/20/2024    8:55 AM 11/20/2023   11:07 AM  Depression screen PHQ 2/9  Decreased Interest 2 2 2   Down, Depressed, Hopeless 2 2 2   PHQ - 2 Score 4 4 4   Altered sleeping 1 1 0  Tired, decreased energy 3 2 2   Change in appetite 0 1 0  Feeling bad or failure about yourself  2 2 2   Trouble concentrating 3 3 3   Moving slowly or fidgety/restless 2 2 1   Suicidal thoughts 1 1 0  PHQ-9 Score 16 16  12    Difficult doing work/chores Not difficult at all Somewhat difficult Somewhat difficult     Data saved with a previous flowsheet row definition   GAD-7 Assessments:     03/31/2024    2:01 PM 02/20/2024    8:55 AM 11/20/2023   11:08 AM 10/26/2023   10:23 AM  GAD 7 : Generalized Anxiety Score  Nervous, Anxious, on Edge 2 3 3 2   Control/stop worrying 3 3 3 2   Worry too much - different things 2 2 3 2   Trouble relaxing 3 3 3 2   Restless 2 2 2 2   Easily annoyed or irritable 3 2 2 2   Afraid - awful might happen 3 3 3 3   Total GAD 7 Score 18 18 19 15   Anxiety Difficulty Not difficult at all Somewhat difficult Very difficult Somewhat difficult    Past Medical History Past Medical History:  Diagnosis Date   Anginal  pain    Anxiety    Aortic atherosclerosis    Arthritis    Cancer (HCC) 01/2017   Skin cancer on right leg   Carotid stenosis, left    100 % blockage   Cholelithiasis    Chronic enteritis    Chronic venous insufficiency    Colon polyps    COPD (chronic obstructive pulmonary disease) (HCC)    mild   Coronary artery disease    Depression    Diastasis recti    Diverticulosis    Dyspnea    Erosive esophagitis    Fatty liver disease, nonalcoholic    Gastritis    GERD (gastroesophageal reflux disease)    Headache    Herniated lumbar intervertebral disc    Hyperlipidemia    Hypertension    IBS (irritable bowel syndrome)    Left carotid artery stenosis    Osteopenia    Peptic ulcer disease    Personal history of tobacco use, presenting hazards to health 03/14/2016   SCC (squamous cell carcinoma)    Stroke (HCC)    TIA's X 2   Tobacco dependence    Vitamin D deficiency     Vital  signs: There were no vitals filed for this visit.  Allergies:  Allergies as of 04/02/2024 - Review Complete 02/20/2024  Allergen Reaction Noted   Buspirone  11/19/2019   Chantix [varenicline tartrate] Other (See Comments) 01/23/2019   Statins Other (See Comments) 07/17/2016   Estradiol  Itching 11/20/2023   Sulfa antibiotics  09/06/2021   Zithromax [azithromycin] Other (See Comments) 04/24/2015   Elemental sulfur Rash 04/24/2015    Medication History Current medications:  Outpatient Encounter Medications as of 04/02/2024  Medication Sig   acetaminophen  (TYLENOL ) 500 MG tablet Take 1,000 mg by mouth every 6 (six) hours as needed.   albuterol  (PROVENTIL  HFA;VENTOLIN  HFA) 108 (90 Base) MCG/ACT inhaler Inhale 2 puffs into the lungs every 6 (six) hours as needed for wheezing or shortness of breath.   albuterol  (PROVENTIL ) (2.5 MG/3ML) 0.083% nebulizer solution Take 3 mLs (2.5 mg total) by nebulization every 6 (six) hours as needed for wheezing or shortness of breath.   amLODipine  (NORVASC ) 5 MG  tablet Take 1 tablet (5 mg total) by mouth daily.   aspirin  EC 81 MG tablet Take 1 tablet (81 mg total) by mouth daily. Swallow whole.   clopidogrel  (PLAVIX ) 75 MG tablet Take 75 mg by mouth daily.   FLUoxetine  (PROZAC ) 20 MG capsule Take 1 capsule (20 mg total) by mouth daily.   pantoprazole  (PROTONIX ) 40 MG tablet Take 40 mg by mouth daily.   REPATHA SURECLICK 140 MG/ML SOAJ Inject 1 mL into the skin every 14 (fourteen) days.   TRELEGY ELLIPTA 100-62.5-25 MCG/ACT AEPB Inhale 1 puff into the lungs daily.   No facility-administered encounter medications on file as of 04/02/2024.     Scribe for Treatment Team: Jennifermarie Franzen R Miracle Criado, LCSW

## 2024-04-07 ENCOUNTER — Telehealth: Payer: Self-pay

## 2024-04-07 NOTE — Telephone Encounter (Signed)
 Copied from CRM #[id number]. Topic: General - Other >> Apr 07, 2024 12:20 PM Delon DASEN wrote: Reason for CRM: returning call from office, [phone]

## 2024-04-07 NOTE — Progress Notes (Signed)
 LVM stating to call us  back to make an appointment with Dr. Franchot and Dr. Lang.

## 2024-04-18 ENCOUNTER — Ambulatory Visit: Admitting: Licensed Clinical Social Worker

## 2024-04-18 DIAGNOSIS — F331 Major depressive disorder, recurrent, moderate: Secondary | ICD-10-CM

## 2024-04-18 DIAGNOSIS — F4321 Adjustment disorder with depressed mood: Secondary | ICD-10-CM

## 2024-04-18 DIAGNOSIS — F411 Generalized anxiety disorder: Secondary | ICD-10-CM

## 2024-04-18 NOTE — BH Specialist Note (Unsigned)
 Virtual Visit via Video Note  I connected with Molly Matthews on 04/18/2024 at  2:00 PM EST by a video enabled telemedicine application and verified that I am speaking with the correct person using two identifiers.  Location: Patient: Primary residence, [place] Provider: Clinician virtual office, [place]   I discussed the limitations of evaluation and management by telemedicine and the availability of in person appointments. The patient expressed understanding and agreed to proceed.   I discussed the assessment and treatment plan with the patient. The patient was provided an opportunity to ask questions and all were answered. The patient agreed with the plan and demonstrated an understanding of the instructions.     Integrated Behavioral Health Follow Up In-Person Visit  MRN: [id number] Name: Molly Matthews  Number of Integrated Behavioral Health Clinician visits: 3- Third Visit  Session Start time: 1400   Session End time: 1425  Total time in minutes: 25    Types of Service: Video visit and Collaborative care  Subjective: Molly Matthews is a 73 y.o. female with history of grief, MDD, anxiety seen in consultation at the request of Curtis Boom FNP for establishment of [facility] management. Molly Matthews is currently taking the following psychiatric medications: Prozac  20 mg. Patient did call her PCP requesting a as needed anxiety medication. PCP recommended hydroxyzine or BuSpar at that time, patient stated she wanted Xanax. Patient reports that she is not taking the medication as prescribed.. Current symptoms include: Little interest or pleasure in doing things, feeling down depressed or hopeless, trouble falling asleep, feeling bad about self, trouble concentrating and focusing, suicidal ideation (with no plan or intent to follow through). Patient denies HI or AVH.Molly Matthews Molly Matthews reports daily vaping of THC, and smoking cigarettes daily. Patient denies any EtOH.    Patient reports the following  symptoms/concerns: ongoing sadness associated with grief, trouble focusing/concentrating, feeling down, depressed, passive suicidal ideation (no plans or intent, just wishing she were not alive or with her grandson), trouble sleeping, worrying, increased irritability.   Duration of problem: ongoing; Severity of problem: severe  Objective: Mood: Anxious, Depressed, and Irritable and Affect: Depressed and Tearful Risk of harm to self or others: Suicidal ideation No plan to harm self or others  Life Context: Family and Social: Molly Matthews reports that upcoming holidays are a trigger for her grief/loss. Molly Matthews tries to engage socially with the senior club and finds peace in her daily prayers.   School/Work: none currently--Molly Matthews is retired.   Self-Care: Molly Matthews reports that she does not have energy to focus on self care currently--is trying hard just to get through the day.  Life Changes: Sudden loss of 3yo grandson and prosecution of the child's parents (her son/daughter in social worker).   Patient and/or Family's Strengths/Protective Factors: Social connections, Concrete supports in place (healthy food, safe environments, etc.), and Physical Health (exercise, healthy diet, medication compliance, etc.)  Goals Addressed: Patient will:  Reduce symptoms of: depression   Increase knowledge and/or ability of: coping skills, healthy habits, self-management skills, and stress reduction   Demonstrate ability to: Begin healthy grieving over loss  Progress towards Goals: Ongoing  Interventions: Interventions utilized:  Behavioral Activation and Supportive Counseling Standardized Assessments completed: Not Needed     03/31/2024    2:00 PM 02/20/2024    8:55 AM 11/20/2023   11:07 AM  Depression screen PHQ 2/9  Decreased Interest 2 2 2   Down, Depressed, Hopeless 2 2 2   PHQ - 2 Score 4 4 4   Altered sleeping  1 1 0  Tired, decreased energy 3 2 2   Change in appetite 0 1 0  Feeling bad or failure about yourself  2 2 2    Trouble concentrating 3 3 3   Moving slowly or fidgety/restless 2 2 1   Suicidal thoughts 1 1 0  PHQ-9 Score 16 16  12    Difficult doing work/chores Not difficult at all Somewhat difficult Somewhat difficult     Data saved with a previous flowsheet row definition      03/31/2024    2:01 PM 02/20/2024    8:55 AM 11/20/2023   11:08 AM 10/26/2023   10:23 AM  GAD 7 : Generalized Anxiety Score  Nervous, Anxious, on Edge 2 3 3 2   Control/stop worrying 3 3 3 2   Worry too much - different things 2 2 3 2   Trouble relaxing 3 3 3 2   Restless 2 2 2 2   Easily annoyed or irritable 3 2 2 2   Afraid - awful might happen 3 3 3 3   Total GAD 7 Score 18 18 19 15   Anxiety Difficulty Not difficult at all Somewhat difficult Very difficult Somewhat difficult   Flowsheet Row Integrated Behavioral Health from 04/18/2024 in [facility] Integrated Behavioral Health from 03/31/2024 in [facility] ED to Hosp-Admission (Discharged) from 03/15/2022 in [facility] 1C MEDICAL TELEMETRY  C-SSRS RISK CATEGORY Low Risk Low Risk No Risk     Patient and/or Family Response: Molly Matthews reports that she's trying hard to get through each day.   Patient Centered Plan: Patient is on the following Treatment Plan(s):  1. Recommend Lexapro  10 mg daily and propranolol 10 mg BID PRN anxiety 2. Recommend a recent vitamin D level with supplementation if needed due to history of vitamin D deficiency 3. BH specialist to follow up  Clinical Assessment/Diagnosis  Encounter Diagnoses  Name Primary?   MDD (major depressive disorder), recurrent episode, moderate (HCC) Yes   Generalized anxiety disorder    Grief       Assessment: Patient currently experiencing depression, anxiety, insomnia, grief.   Patient may benefit from ongoing IBH supports--grief counseling and medication management of symptoms.  Plan: Follow up with behavioral health clinician on :  05/16/2024 Behavioral recommendations:  Call [facility] about getting new PCP Start on recommended medication Behavioral Activation Self care/positive social engagement Referral(s): Integrated [employer] (In Clinic)  [facility] R Chamaine Stankus, LCSW

## 2024-04-20 NOTE — Patient Instructions (Signed)
 Using Behavioral Activation to manage stress/depression symptoms     Identify/understand your own mood triggers.   Structure your day--get up around the same time, eat meals/snacks around the same time, go to bed around the same time.   Purposefully schedule self care time and time to complete tasks. This can include quiet time.  Stimulate your brain--go for a walk, text/call a friend or family member, if you are indoors--go outside (and vice versa), go for a drive, go to a store with bright colors and bright lights. Try to do things in a different way--drive to your favorite places using an alternative route, or instead of starting on the right side of the grocery store when shopping, start on the left side. You might feel a bit uncomfortable doing things outside of the comfort zone, but this is helping the brain create new neural pathways and is very healthy for brain/emotional health.   Physical movement based on your ability. If you can go for a walk, do stretches, even waving your hands to music can trigger feel-good endorphins in the brain and help release physical tension we all hold in our bodies.  Even 5 minutes can make a difference.   Be intentional about doing things that bring you joy (or used to bring you joy), and look for the things in every day that make you happy.  Seek those glimmers of joy each day.  Set a timer for 5 minutes for a harder task (ex. Laundry, washing dishes).  Allow yourself to work distraction-free for 5 minutes, then stop when the timer goes off. If you need a break, take a break. If you want to continue working then set another timer for whatever time you choose.   Limit or eliminate substance use including alcohol, marijuana, or recreational use of prescription medication.  Let in the light!! Open the window blinds, curtains and let natural light in. Even sitting near a window or sitting outside can boost your mood, especially in the wintertime when  there is less daylight.   If you take medication to manage symptoms, remember to take all medications as they are prescribed (please read all labels!!)    Things to envision for ourselves to to improve inspiration, motivation, and initiative :  improving physical wellness, focus on family relationships, focusing on our own mental/emotional well being, being a part of a bigger community, finding a hobby, being a part of something that fosters personal growth, engaging socially with others (even digitally!!!)    ANXIETY/PANIC EPISODE MANAGEMENT (CBT/MINDFULNESS BASED)   If you are in a highly stimulating or triggering environment, change your location to a less stimulating or safer environment .   Stimulate your senses by tasting/eating a sour candy (such as a lemon drop) or a strong flavored cough drop.  This triggers smell, taste, and touch since we  have had lots of nerve endings inside of your mouth.   Practice slow, controlled breathing to avoid hyperventilation.  Breathing into your nose for the count of 4 inside your head, holding your breath for the count of 4 inside your head, and exhale slowly counting to 8 inside your head.  This is called 4-4-8 breathing, or triangle breathing. (Controlled breathing). This helps manage panic, anxiety, anger, and tearfulness.  Additional grounding exercises include rubbing your hands softly together, or wiggling toes inside your shoes, pretending to grasp the floor with your toes.    Listen to calming music or sounds  Count backwards in your head by  twos or tens or recite multiplication tables in your head.  Visualize a calming, happy place and identify 5 explicit details about this place (color, temperature, smells, visual details, etc.)  Splash cold water on your face or hold an ice cube in your hand (alternate hands)  Clench and release muscle groups (hands, shoulders, facial muscles)  Engage in soothing activities to recover after a  stressful/anxious episode such as: drinking a warm beverage, sitting in your favorite comfortable location, positive physical contact with a pet or weighted blanket, using positive self talk/positive affirmations.   Download PTSD Coach app for your phone or tablet--its free and has lots of tips to help you manage panic episodes no matter where you are!    Emergency Resources:  National Suicide & Crisis Lifeline: Call or text 988  Crisis Text Line: Text HOME to 332-080-6994  [facility]  [address] [place], [place] [address] [phone] or [phone] [facility] 24/7 FOR ANYONE [address], [place], [place]  [phone] Fax: [phone] [email or web address] *Interpreters available *Accepts all insurance and uninsured for Urgent Care needs *Accepts Medicaid and uninsured for outpatient treatment (below)

## 2024-04-30 ENCOUNTER — Ambulatory Visit
Admission: RE | Admit: 2024-04-30 | Discharge: 2024-04-30 | Disposition: A | Discharge status: Home / Self Care | Source: Ambulatory Visit | Attending: Acute Care | Admitting: Acute Care

## 2024-04-30 DIAGNOSIS — Z122 Encounter for screening for malignant neoplasm of respiratory organs: Secondary | ICD-10-CM | POA: Diagnosis present

## 2024-04-30 DIAGNOSIS — Z87891 Personal history of nicotine dependence: Secondary | ICD-10-CM | POA: Diagnosis present

## 2024-04-30 DIAGNOSIS — F17210 Nicotine dependence, cigarettes, uncomplicated: Secondary | ICD-10-CM | POA: Insufficient documentation

## 2024-05-12 ENCOUNTER — Encounter (INDEPENDENT_AMBULATORY_CARE_PROVIDER_SITE_OTHER)

## 2024-05-12 ENCOUNTER — Other Ambulatory Visit: Payer: Self-pay

## 2024-05-12 ENCOUNTER — Encounter: Payer: Self-pay | Admitting: *Deleted

## 2024-05-12 ENCOUNTER — Telehealth: Payer: Self-pay

## 2024-05-12 ENCOUNTER — Ambulatory Visit (INDEPENDENT_AMBULATORY_CARE_PROVIDER_SITE_OTHER): Admitting: Vascular Surgery

## 2024-05-12 DIAGNOSIS — F17210 Nicotine dependence, cigarettes, uncomplicated: Secondary | ICD-10-CM

## 2024-05-12 DIAGNOSIS — Z87891 Personal history of nicotine dependence: Secondary | ICD-10-CM

## 2024-05-12 DIAGNOSIS — Z122 Encounter for screening for malignant neoplasm of respiratory organs: Secondary | ICD-10-CM

## 2024-05-12 NOTE — Telephone Encounter (Signed)
 Please call representative back.

## 2024-05-12 NOTE — Telephone Encounter (Signed)
 Copied from CRM #[id number]. Topic: General - Other >> May 12, 2024 11:27 AM Joesph NOVAK wrote: Reason for CRM: Mylene is calling to confirm patients chronic special needs condition. Please fu   PH:[phone]. Direct line.

## 2024-05-15 ENCOUNTER — Ambulatory Visit (INDEPENDENT_AMBULATORY_CARE_PROVIDER_SITE_OTHER)

## 2024-05-15 VITALS — BP 117/68 | HR 86 | Temp 98.1°F | Wt 133.4 lb

## 2024-05-15 DIAGNOSIS — J449 Chronic obstructive pulmonary disease, unspecified: Secondary | ICD-10-CM

## 2024-05-15 DIAGNOSIS — F332 Major depressive disorder, recurrent severe without psychotic features: Secondary | ICD-10-CM

## 2024-05-15 DIAGNOSIS — I70222 Atherosclerosis of native arteries of extremities with rest pain, left leg: Secondary | ICD-10-CM | POA: Insufficient documentation

## 2024-05-15 DIAGNOSIS — J3089 Other allergic rhinitis: Secondary | ICD-10-CM | POA: Diagnosis not present

## 2024-05-15 MED ORDER — TRELEGY ELLIPTA 100-62.5-25 MCG/ACT IN AEPB: 1.0000 | INHALATION_SPRAY | Freq: Every day | RESPIRATORY_TRACT | 3 refills | Status: AC

## 2024-05-15 MED ORDER — FLUTICASONE PROPIONATE 50 MCG/ACT NA SUSP: 2.0000 | Freq: Every day | NASAL | 6 refills | Status: AC

## 2024-05-15 MED ORDER — ESCITALOPRAM OXALATE 10 MG PO TABS: 10.0000 mg | ORAL_TABLET | Freq: Every day | ORAL | 3 refills | Status: AC

## 2024-05-15 MED ORDER — BUSPIRONE HCL 5 MG PO TABS: 5.0000 mg | ORAL_TABLET | Freq: Two times a day (BID) | ORAL | 3 refills | Status: AC

## 2024-05-15 NOTE — Patient Instructions (Signed)
 I recommend restarting a vitamin D supplement, you can get this over the counter. I recommend vitamin D3 [phone] international units (20-38mcg) daily.
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
## 2024-05-16 ENCOUNTER — Ambulatory Visit: Admitting: Licensed Clinical Social Worker

## 2024-05-16 DIAGNOSIS — F331 Major depressive disorder, recurrent, moderate: Secondary | ICD-10-CM
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
## 2024-05-17 NOTE — Patient Instructions (Signed)
 Using Behavioral Activation to manage stress/depression symptoms     Identify/understand your own mood triggers.   Structure your day--get up around the same time, eat meals/snacks around the same time, go to bed around the same time.   Purposefully schedule self care time and time to complete tasks. This can include quiet time.  Stimulate your brain--go for a walk, text/call a friend or family member, if you are indoors--go outside (and vice versa), go for a drive, go to a store with bright colors and bright lights. Try to do things in a different way--drive to your favorite places using an alternative route, or instead of starting on the right side of the grocery store when shopping, start on the left side. You might feel a bit uncomfortable doing things outside of the comfort zone, but this is helping the brain create new neural pathways and is very healthy for brain/emotional health.   Physical movement based on your ability. If you can go for a walk, do stretches, even waving your hands to music can trigger feel-good endorphins in the brain and help release physical tension we all hold in our bodies.  Even 5 minutes can make a difference.   Be intentional about doing things that bring you joy (or used to bring you joy), and look for the things in every day that make you happy.  Seek those glimmers of joy each day.  Set a timer for 5 minutes for a harder task (ex. Laundry, washing dishes).  Allow yourself to work distraction-free for 5 minutes, then stop when the timer goes off. If you need a break, take a break. If you want to continue working then set another timer for whatever time you choose.   Limit or eliminate substance use including alcohol, marijuana, or recreational use of prescription medication.  Let in the light!! Open the window blinds, curtains and let natural light in. Even sitting near a window or sitting outside can boost your mood, especially in the wintertime when  there is less daylight.   If you take medication to manage symptoms, remember to take all medications as they are prescribed (please read all labels!!)    Things to envision for ourselves to to improve inspiration, motivation, and initiative :  improving physical wellness, focus on family relationships, focusing on our own mental/emotional well being, being a part of a bigger community, finding a hobby, being a part of something that fosters personal growth, engaging socially with others (even digitally!!!)

## 2024-05-27 ENCOUNTER — Ambulatory Visit: Payer: Self-pay

## 2024-05-27 NOTE — Telephone Encounter (Signed)
 Please let patient know that she can stop the buspar . I recommend she continue the lexapro .

## 2024-05-27 NOTE — Telephone Encounter (Signed)
 Not a  pt, will route to PCP (not sure what pools they have so will send to PCP)

## 2024-05-27 NOTE — Telephone Encounter (Signed)
 FYI Only or Action Required?: Action required by provider: update on patient condition and reaction to Buspar - wants to stop med.  Patient was last seen in primary care on 05/15/2024 by Franchot Isaiah LABOR, MD.  Called Nurse Triage reporting Medication Reaction.  Symptoms began several days ago.  Interventions attempted: OTC medications: tylenol  and Rest, hydration, or home remedies.  Symptoms are: gradually worsening.  Triage Disposition: See PCP When Office is Open (Within 3 Days)  Patient/caregiver understands and will follow disposition?: No, wishes to speak with PCP  Message from Hadassah PARAS sent at 05/27/2024 11:51 AM EST  Reason for Triage: Pt believes she is having a reaction to busPIRone  (BUSPAR ) 5 MG tablet. She is having body aches, hand hurts. This began 1/15. Transferring to NT   Reason for Disposition  [1] MODERATE pain (e.g., interferes with normal activities) AND [2] present > 3 days  Answer Assessment - Initial Assessment Questions Recently started Buspar  and Lexapro  on 1/15. About 4-5days ago started waking up with generalized body aches and hand stiffness. Her hand gets so stiff she has to use her other one to open it up in the morning- improves throughout the day.  Denies fever, CP, SOB, Dizziness. Gets real hot sometimes but denies fever.  Cough since prior to OV. No worse or better. Headaches that she is not sure if come from medication or from coughing. Taking Tylenol   Patient no longer wants to take Buspar  as her online research shows the body aches and hand pain are a potential side effects. Wanting provider to advise further .   1. ONSET: When did the muscle aches or body pains start?      4 days ago  2. LOCATION: What part of your body is hurting? (e.g., entire body, arms, legs)      Generalized body aches and hand pain  3. SEVERITY: How bad is the pain? (Scale 1-10; or mild, moderate, severe)     10/10- worse in the morning  4. CAUSE: What do you think  is causing the pains?     Buspar   5. FEVER: Do you have a fever? If Yes, ask: What is your temperature, how was it measured, and  when did it start?      Denies  6. OTHER SYMPTOMS: Do you have any other symptoms? (e.g., chest pain, cold or flu symptoms, rash, weakness, weight loss)     Cough, headaches  Protocols used: Muscle Aches and Body Pain-A-AH

## 2024-05-27 NOTE — Telephone Encounter (Signed)
 Pt returned call. This RN informed pt, per Dr. Debi note, to stop the buspar  and continue lexapro . Pt verbalized understanding and confirmed no further questions or concerns at this time.

## 2024-05-27 NOTE — Telephone Encounter (Signed)
 This encounter was created in error - please disregard.

## 2024-05-30 ENCOUNTER — Other Ambulatory Visit (INDEPENDENT_AMBULATORY_CARE_PROVIDER_SITE_OTHER): Payer: Self-pay | Admitting: Vascular Surgery

## 2024-05-30 DIAGNOSIS — Z9889 Other specified postprocedural states: Secondary | ICD-10-CM

## 2024-05-30 NOTE — Progress Notes (Unsigned)
 "                                                                      MRN : [id number]  Molly Matthews is a 74 y.o. (07-13-1950) female who presents with chief complaint of check circulation.  History of Present Illness:  The patient returns to the office for followup and review status post angiogram with intervention on 01/22/2024.    Procedure: Bilateral common iliac artery stents kissing technique Left popliteal artery stent   The patient notes improvement in the lower extremity symptoms. No interval shortening of the patient's claudication distance or rest pain symptoms. No new ulcers or wounds have occurred since the last visit.   She is also followed for carotid stenosis which is followed by ultrasound.  She has known occlusion of the left internal carotid artery and is status post successful CEA of the right   The patient denies amaurosis fugax. There is no recent history of TIA symptoms or focal motor deficits. There is no prior documented CVA.   The patient is taking enteric-coated aspirin  81 mg daily.   The patient is also followed for leg swelling. The patient first noticed the swelling remotely. The swelling is associated with pain and discoloration. The pain and swelling worsens with prolonged dependency and improves with elevation. The pain is unrelated to activity.  The patient notes that in the morning the legs are significantly improved but they steadily worsened throughout the course of the day. The patient also notes a steady worsening of the discoloration in the ankle and shin area.  Overall she states this particular issue has been stable compared to her previous visit.   Elevation makes the leg symptoms better, dependency makes them much worse. There is no history of ulcerations. The patient denies any recent changes in medications.   The patient has not been wearing graduated compression.   The patient denies a history of DVT or PE. There is no prior history of  phlebitis.   No history of malignancies. No history of trauma or groin or pelvic surgery. There is no history of radiation treatment to the groin or pelvis   There has been a significant changes to the patient's overall health care.      The patient has a history of coronary artery disease, no recent episodes of angina or shortness of breath.   There is a history of hyperlipidemia which is being treated with a statin.     Previous carotid Duplex showed RICA 40-59% (s/p right CEA) and known occlusion of the left ICA.  No significant change compared to last study in 10/13/2021   CT scanning of the chest for lung cancer screening dated 05/01/2022 is reviewed by me demonstrates a stable saccular descending thoracic aortic aneurysm measuring 3.8 cm in maximal dimension   ABI's obtained today show rt=1.08 and Lt=1.12  (previous ABI's Rt=0.81 and Lt=0.91).  There are now triphasic posterior tibial signals bilaterally     Active Medications[1]  Past Medical History:  Diagnosis Date   Anginal pain    Anxiety    Aortic atherosclerosis    Arthritis    Cancer (HCC) 01/2017   Skin cancer on right leg   Carotid stenosis, left  100 % blockage   Cholelithiasis    Chronic enteritis    Chronic venous insufficiency    Colon polyps    COPD (chronic obstructive pulmonary disease) (HCC)    mild   Coronary artery disease    Depression    Diastasis recti    Diverticulosis    Dyspnea    Erosive esophagitis    Fatty liver disease, nonalcoholic    Gastritis    GERD (gastroesophageal reflux disease)    Headache    Herniated lumbar intervertebral disc    Hyperlipidemia    Hypertension    IBS (irritable bowel syndrome)    Left carotid artery stenosis    Osteopenia    Peptic ulcer disease    Personal history of tobacco use, presenting hazards to health 03/14/2016   SCC (squamous cell carcinoma)    Stroke (HCC)    TIA's X 2   Tobacco dependence    Vitamin D deficiency     Past Surgical  History:  Procedure Laterality Date   ABDOMINAL HYSTERECTOMY     BREAST BIOPSY Left    negative 2008   CAROTID ENDARTERECTOMY     CHOLECYSTECTOMY     COLONOSCOPY W/ POLYPECTOMY     COLONOSCOPY WITH PROPOFOL  N/A 07/26/2020   Procedure: COLONOSCOPY WITH PROPOFOL ;  Surgeon: Toledo, Ladell POUR, MD;  Location: [facility];  Service: Gastroenterology;  Laterality: N/A;   DILATION AND CURETTAGE OF UTERUS     ESOPHAGOGASTRODUODENOSCOPY (EGD) WITH PROPOFOL  N/A 01/26/2017   Procedure: ESOPHAGOGASTRODUODENOSCOPY (EGD) WITH PROPOFOL ;  Surgeon: Toledo, Ladell POUR, MD;  Location: [facility];  Service: Gastroenterology;  Laterality: N/A;   ESOPHAGOGASTRODUODENOSCOPY (EGD) WITH PROPOFOL  N/A 07/26/2020   Procedure: ESOPHAGOGASTRODUODENOSCOPY (EGD) WITH PROPOFOL ;  Surgeon: Toledo, Ladell POUR, MD;  Location: [facility];  Service: Gastroenterology;  Laterality: N/A;   HEMORRHOID SURGERY N/A 07/25/2016   Procedure: HEMORRHOIDECTOMY INTERNAL AND EXTERNAL;  Surgeon: Larinda Unknown Sharps, MD;  Location: [facility];  Service: General;  Laterality: N/A;   LOWER EXTREMITY ANGIOGRAPHY Left 01/22/2024   Procedure: Lower Extremity Angiography;  Surgeon: Jama Cordella MATSU, MD;  Location: [facility];  Service: Cardiovascular;  Laterality: Left;   LOWER EXTREMITY INTERVENTION Left 01/22/2024   Procedure: LOWER EXTREMITY INTERVENTION;  Surgeon: Jama Cordella MATSU, MD;  Location: [facility];  Service: Cardiovascular;  Laterality: Left;   MICROLARYNGOSCOPY N/A 01/29/2019   Procedure: MICROLARYNGOSCOPY with Vocal Cord polyp removal.   No laser is needed.;  Surgeon: Blair Mt, MD;  Location: [facility];  Service: ENT;  Laterality: N/A;   VASCULAR SURGERY Right    Carotid Endarterectomy     Social History Social History[2]  Family History Family History  Problem Relation Age of Onset   Breast cancer Mother 14   Atrial fibrillation Mother    Breast cancer Sister 54   Heart attack Father    Colon  cancer Brother    Lung cancer Brother     Allergies[3]   REVIEW OF SYSTEMS (Negative unless checked)  Constitutional: [] Weight loss  [] Fever  [] Chills Cardiac: [] Chest pain   [] Chest pressure   [] Palpitations   [] Shortness of breath when laying flat   [] Shortness of breath with exertion. Vascular:  [x] Pain in legs with walking   [] Pain in legs at rest  [] History of DVT   [] Phlebitis   [] Swelling in legs   [] Varicose veins   [] Non-healing ulcers Pulmonary:   [] Uses home oxygen   [] Productive cough   [] Hemoptysis   [] Wheeze  [x] COPD   []   Asthma Neurologic:  [] Dizziness   [] Seizures   [] History of stroke   [] History of TIA  [] Aphasia   [] Vissual changes   [] Weakness or numbness in arm   [] Weakness or numbness in leg Musculoskeletal:   [] Joint swelling   [x] Joint pain   [] Low back pain Hematologic:  [] Easy bruising  [] Easy bleeding   [] Hypercoagulable state   [] Anemic Gastrointestinal:  [] Diarrhea   [] Vomiting  [x] Gastroesophageal reflux/heartburn   [] Difficulty swallowing. Genitourinary:  [] Chronic kidney disease   [] Difficult urination  [] Frequent urination   [] Blood in urine Skin:  [] Rashes   [] Ulcers  Psychological:  [x] History of anxiety   [x]  History of major depression.  Physical Examination  There were no vitals filed for this visit. There is no height or weight on file to calculate BMI. Gen: WD/WN, NAD Head: [place]/AT, No temporalis wasting.  Ear/Nose/Throat: Hearing grossly intact, nares w/o erythema or drainage Eyes: PER, EOMI, sclera nonicteric.  Neck: Supple, no masses.  No bruit or JVD.  Pulmonary:  Good air movement, no audible wheezing, no use of accessory muscles.  Cardiac: RRR, normal S1, S2, no Murmurs. Vascular:  mild trophic changes, no open wounds Vessel Right Left  Radial Palpable Palpable  PT Not Palpable Not Palpable  DP Not Palpable Not Palpable  Gastrointestinal: soft, non-distended. No guarding/no peritoneal signs.  Musculoskeletal: M/S 5/5 throughout.  No  visible deformity.  Neurologic: CN 2-12 intact. Pain and light touch intact in extremities.  Symmetrical.  Speech is fluent. Motor exam as listed above. Psychiatric: Judgment intact, Mood & affect appropriate for pt's clinical situation. Dermatologic: No rashes or ulcers noted.  No changes consistent with cellulitis.   CBC Lab Results  Component Value Date   WBC 8.4 11/22/2023   HGB 16.5 (H) 11/22/2023   HCT 49.3 (H) 11/22/2023   MCV 96 11/22/2023   PLT 260 11/22/2023    BMET    Component Value Date/Time   NA 145 (H) 11/22/2023 0819   NA 142 01/29/2014 2124   K 4.1 11/22/2023 0819   K 4.0 01/29/2014 2124   CL 106 11/22/2023 0819   CL 110 (H) 01/29/2014 2124   CO2 21 11/22/2023 0819   CO2 25 01/29/2014 2124   GLUCOSE 109 (H) 11/22/2023 0819   GLUCOSE 183 (H) 03/16/2022 0448   GLUCOSE 137 (H) 01/29/2014 2124   BUN 16 01/22/2024 0723   BUN 10 11/22/2023 0819   BUN 10 01/29/2014 2124   CREATININE 0.74 01/22/2024 0723   CREATININE 1.00 01/29/2014 2124   CALCIUM  9.9 11/22/2023 0819   CALCIUM  9.3 01/29/2014 2124   GFRNONAA >60 01/22/2024 0723   GFRNONAA 59 (L) 01/29/2014 2124   GFRNONAA >60 12/22/2013 1159   GFRAA >60 04/25/2018 1713   GFRAA >60 01/29/2014 2124   GFRAA >60 12/22/2013 1159   CrCl cannot be calculated (Patient's most recent lab result is older than the maximum 21 days allowed.).  COAG Lab Results  Component Value Date   INR 1.0 11/15/2021   INR 1.1 12/31/2020    Radiology No results found.   Assessment/Plan There are no diagnoses linked to this encounter.   [name], MD  05/30/2024 10:46 AM      [1]  No outpatient medications have been marked as taking for the 06/02/24 encounter (Appointment) with Shawl, Cordella MATSU, MD.  [2]  Social History Tobacco Use   Smoking status: Every Day    Current packs/day: 1.00    Average packs/day: 1 pack/day for 45.0 years (45.0 ttl pk-yrs)  Types: Cigarettes    Passive exposure: Never    Smokeless tobacco: Never  Vaping Use   Vaping status: Former  Substance Use Topics   Alcohol use: No   Drug use: No  [3]  Allergies Allergen Reactions   Chantix [Varenicline Tartrate] Other (See Comments)    Jittery muscle movements   Statins Other (See Comments)    Muscle and joint aches - can take in small doses   Estradiol  Itching   Sulfa Antibiotics     Other reaction(s): Unknown   Zithromax [Azithromycin] Other (See Comments)    Back Pain   Elemental Sulfur Rash   "

## 2024-06-02 ENCOUNTER — Encounter (INDEPENDENT_AMBULATORY_CARE_PROVIDER_SITE_OTHER)

## 2024-06-02 ENCOUNTER — Ambulatory Visit (INDEPENDENT_AMBULATORY_CARE_PROVIDER_SITE_OTHER): Admitting: Vascular Surgery

## 2024-06-02 DIAGNOSIS — I70213 Atherosclerosis of native arteries of extremities with intermittent claudication, bilateral legs: Secondary | ICD-10-CM

## 2024-06-02 DIAGNOSIS — E782 Mixed hyperlipidemia: Secondary | ICD-10-CM

## 2024-06-02 DIAGNOSIS — I872 Venous insufficiency (chronic) (peripheral): Secondary | ICD-10-CM

## 2024-06-02 DIAGNOSIS — I7123 Aneurysm of the descending thoracic aorta, without rupture: Secondary | ICD-10-CM

## 2024-06-02 DIAGNOSIS — I6523 Occlusion and stenosis of bilateral carotid arteries: Secondary | ICD-10-CM

## 2024-06-12 ENCOUNTER — Ambulatory Visit

## 2024-06-16 ENCOUNTER — Ambulatory Visit (INDEPENDENT_AMBULATORY_CARE_PROVIDER_SITE_OTHER): Admitting: Vascular Surgery

## 2024-06-16 ENCOUNTER — Encounter (INDEPENDENT_AMBULATORY_CARE_PROVIDER_SITE_OTHER)

## 2024-06-20 ENCOUNTER — Ambulatory Visit: Payer: Self-pay | Admitting: Licensed Clinical Social Worker
